# Patient Record
Sex: Female | Born: 1975 | Race: Black or African American | Hispanic: No | Smoking: Current every day smoker
Health system: Southern US, Community
[De-identification: ages and names within clinical notes are randomized; demographics above are authoritative.]

## PROBLEM LIST (undated history)

## (undated) DIAGNOSIS — K219 Gastro-esophageal reflux disease without esophagitis: Secondary | ICD-10-CM

## (undated) DIAGNOSIS — F1910 Other psychoactive substance abuse, uncomplicated: Secondary | ICD-10-CM

## (undated) DIAGNOSIS — M5136 Other intervertebral disc degeneration, lumbar region: Secondary | ICD-10-CM

## (undated) DIAGNOSIS — G518 Other disorders of facial nerve: Secondary | ICD-10-CM

## (undated) DIAGNOSIS — B029 Zoster without complications: Secondary | ICD-10-CM

## (undated) DIAGNOSIS — F329 Major depressive disorder, single episode, unspecified: Secondary | ICD-10-CM

## (undated) DIAGNOSIS — F419 Anxiety disorder, unspecified: Secondary | ICD-10-CM

## (undated) DIAGNOSIS — R45851 Suicidal ideations: Secondary | ICD-10-CM

## (undated) DIAGNOSIS — F32A Depression, unspecified: Secondary | ICD-10-CM

## (undated) DIAGNOSIS — I10 Essential (primary) hypertension: Secondary | ICD-10-CM

## (undated) HISTORY — DX: Essential (primary) hypertension: I10

## (undated) HISTORY — PX: TUBAL LIGATION: SHX77

## (undated) HISTORY — DX: Other intervertebral disc degeneration, lumbar region: M51.36

## (undated) HISTORY — DX: Gastro-esophageal reflux disease without esophagitis: K21.9

## (undated) HISTORY — DX: Other disorders of facial nerve: G51.8

## (undated) HISTORY — DX: Other psychoactive substance abuse, uncomplicated: F19.10

## (undated) HISTORY — PX: OTHER SURGICAL HISTORY: SHX169

---

## 1998-01-06 ENCOUNTER — Other Ambulatory Visit: Admission: RE | Admit: 1998-01-06 | Discharge: 1998-01-06 | Payer: Self-pay | Admitting: Obstetrics & Gynecology

## 1998-03-14 ENCOUNTER — Emergency Department (HOSPITAL_COMMUNITY): Admission: EM | Admit: 1998-03-14 | Discharge: 1998-03-15 | Payer: Self-pay | Admitting: Emergency Medicine

## 1999-03-14 ENCOUNTER — Encounter: Payer: Self-pay | Admitting: Emergency Medicine

## 1999-03-14 ENCOUNTER — Emergency Department (HOSPITAL_COMMUNITY): Admission: EM | Admit: 1999-03-14 | Discharge: 1999-03-14 | Payer: Self-pay | Admitting: Emergency Medicine

## 1999-09-01 ENCOUNTER — Inpatient Hospital Stay (HOSPITAL_COMMUNITY): Admission: AD | Admit: 1999-09-01 | Discharge: 1999-09-01 | Payer: Self-pay | Admitting: *Deleted

## 2000-04-07 ENCOUNTER — Emergency Department (HOSPITAL_COMMUNITY): Admission: EM | Admit: 2000-04-07 | Discharge: 2000-04-07 | Payer: Self-pay | Admitting: Emergency Medicine

## 2001-09-03 ENCOUNTER — Inpatient Hospital Stay (HOSPITAL_COMMUNITY): Admission: AD | Admit: 2001-09-03 | Discharge: 2001-09-03 | Payer: Self-pay | Admitting: *Deleted

## 2001-09-22 ENCOUNTER — Inpatient Hospital Stay (HOSPITAL_COMMUNITY): Admission: AD | Admit: 2001-09-22 | Discharge: 2001-09-22 | Payer: Self-pay | Admitting: Obstetrics

## 2001-09-22 ENCOUNTER — Encounter: Payer: Self-pay | Admitting: Obstetrics

## 2002-01-15 ENCOUNTER — Emergency Department (HOSPITAL_COMMUNITY): Admission: EM | Admit: 2002-01-15 | Discharge: 2002-01-15 | Payer: Self-pay | Admitting: Emergency Medicine

## 2002-05-13 ENCOUNTER — Emergency Department (HOSPITAL_COMMUNITY): Admission: EM | Admit: 2002-05-13 | Discharge: 2002-05-13 | Payer: Self-pay | Admitting: Emergency Medicine

## 2002-05-13 ENCOUNTER — Encounter: Payer: Self-pay | Admitting: Emergency Medicine

## 2002-07-07 ENCOUNTER — Emergency Department (HOSPITAL_COMMUNITY): Admission: EM | Admit: 2002-07-07 | Discharge: 2002-07-07 | Payer: Self-pay | Admitting: Emergency Medicine

## 2002-07-28 ENCOUNTER — Emergency Department (HOSPITAL_COMMUNITY): Admission: EM | Admit: 2002-07-28 | Discharge: 2002-07-28 | Payer: Self-pay | Admitting: Emergency Medicine

## 2002-08-26 ENCOUNTER — Emergency Department (HOSPITAL_COMMUNITY): Admission: EM | Admit: 2002-08-26 | Discharge: 2002-08-26 | Payer: Self-pay | Admitting: Emergency Medicine

## 2002-10-25 ENCOUNTER — Inpatient Hospital Stay (HOSPITAL_COMMUNITY): Admission: AD | Admit: 2002-10-25 | Discharge: 2002-10-25 | Payer: Self-pay | Admitting: *Deleted

## 2003-01-01 ENCOUNTER — Inpatient Hospital Stay (HOSPITAL_COMMUNITY): Admission: AD | Admit: 2003-01-01 | Discharge: 2003-01-01 | Payer: Self-pay | Admitting: *Deleted

## 2003-01-01 ENCOUNTER — Encounter: Payer: Self-pay | Admitting: *Deleted

## 2003-04-13 ENCOUNTER — Inpatient Hospital Stay (HOSPITAL_COMMUNITY): Admission: AD | Admit: 2003-04-13 | Discharge: 2003-04-13 | Payer: Self-pay | Admitting: Obstetrics

## 2003-04-15 ENCOUNTER — Inpatient Hospital Stay (HOSPITAL_COMMUNITY): Admission: AD | Admit: 2003-04-15 | Discharge: 2003-04-15 | Payer: Self-pay | Admitting: Obstetrics

## 2003-06-02 ENCOUNTER — Inpatient Hospital Stay (HOSPITAL_COMMUNITY): Admission: AD | Admit: 2003-06-02 | Discharge: 2003-06-05 | Payer: Self-pay | Admitting: Obstetrics

## 2003-06-05 ENCOUNTER — Inpatient Hospital Stay (HOSPITAL_COMMUNITY): Admission: AD | Admit: 2003-06-05 | Discharge: 2003-06-06 | Payer: Self-pay | Admitting: Obstetrics

## 2003-06-07 ENCOUNTER — Inpatient Hospital Stay (HOSPITAL_COMMUNITY): Admission: AD | Admit: 2003-06-07 | Discharge: 2003-06-07 | Payer: Self-pay | Admitting: Obstetrics

## 2004-01-31 ENCOUNTER — Emergency Department (HOSPITAL_COMMUNITY): Admission: EM | Admit: 2004-01-31 | Discharge: 2004-01-31 | Payer: Self-pay | Admitting: Emergency Medicine

## 2004-07-29 ENCOUNTER — Emergency Department (HOSPITAL_COMMUNITY): Admission: EM | Admit: 2004-07-29 | Discharge: 2004-07-30 | Payer: Self-pay | Admitting: Emergency Medicine

## 2004-09-12 ENCOUNTER — Emergency Department (HOSPITAL_COMMUNITY): Admission: EM | Admit: 2004-09-12 | Discharge: 2004-09-13 | Payer: Self-pay | Admitting: Emergency Medicine

## 2004-09-16 ENCOUNTER — Emergency Department (HOSPITAL_COMMUNITY): Admission: EM | Admit: 2004-09-16 | Discharge: 2004-09-16 | Payer: Self-pay | Admitting: Emergency Medicine

## 2005-01-12 ENCOUNTER — Emergency Department (HOSPITAL_COMMUNITY): Admission: EM | Admit: 2005-01-12 | Discharge: 2005-01-12 | Payer: Self-pay | Admitting: Emergency Medicine

## 2005-03-30 ENCOUNTER — Inpatient Hospital Stay (HOSPITAL_COMMUNITY): Admission: AD | Admit: 2005-03-30 | Discharge: 2005-03-30 | Payer: Self-pay | Admitting: Obstetrics

## 2005-04-20 ENCOUNTER — Inpatient Hospital Stay (HOSPITAL_COMMUNITY): Admission: AD | Admit: 2005-04-20 | Discharge: 2005-04-20 | Payer: Self-pay | Admitting: Obstetrics

## 2005-09-13 ENCOUNTER — Inpatient Hospital Stay (HOSPITAL_COMMUNITY): Admission: AD | Admit: 2005-09-13 | Discharge: 2005-09-13 | Payer: Self-pay

## 2005-09-23 ENCOUNTER — Encounter (INDEPENDENT_AMBULATORY_CARE_PROVIDER_SITE_OTHER): Payer: Self-pay | Admitting: Specialist

## 2005-09-23 ENCOUNTER — Inpatient Hospital Stay (HOSPITAL_COMMUNITY): Admission: AD | Admit: 2005-09-23 | Discharge: 2005-09-26 | Payer: Self-pay | Admitting: Obstetrics

## 2006-08-05 ENCOUNTER — Emergency Department (HOSPITAL_COMMUNITY): Admission: EM | Admit: 2006-08-05 | Discharge: 2006-08-05 | Payer: Self-pay | Admitting: *Deleted

## 2006-12-26 ENCOUNTER — Encounter: Admission: RE | Admit: 2006-12-26 | Discharge: 2006-12-26 | Payer: Self-pay | Admitting: Internal Medicine

## 2007-03-31 ENCOUNTER — Emergency Department (HOSPITAL_COMMUNITY): Admission: EM | Admit: 2007-03-31 | Discharge: 2007-03-31 | Payer: Self-pay | Admitting: Emergency Medicine

## 2007-04-09 ENCOUNTER — Emergency Department (HOSPITAL_COMMUNITY): Admission: EM | Admit: 2007-04-09 | Discharge: 2007-04-09 | Payer: Self-pay | Admitting: Emergency Medicine

## 2007-05-22 ENCOUNTER — Emergency Department (HOSPITAL_COMMUNITY): Admission: EM | Admit: 2007-05-22 | Discharge: 2007-05-22 | Payer: Self-pay | Admitting: Emergency Medicine

## 2007-08-04 ENCOUNTER — Emergency Department (HOSPITAL_COMMUNITY): Admission: EM | Admit: 2007-08-04 | Discharge: 2007-08-04 | Payer: Self-pay | Admitting: Emergency Medicine

## 2009-06-27 ENCOUNTER — Emergency Department (HOSPITAL_COMMUNITY): Admission: EM | Admit: 2009-06-27 | Discharge: 2009-06-27 | Payer: Self-pay | Admitting: Emergency Medicine

## 2009-08-12 ENCOUNTER — Emergency Department (HOSPITAL_COMMUNITY): Admission: EM | Admit: 2009-08-12 | Discharge: 2009-08-12 | Payer: Self-pay | Admitting: Emergency Medicine

## 2010-02-03 ENCOUNTER — Emergency Department (HOSPITAL_COMMUNITY): Admission: EM | Admit: 2010-02-03 | Discharge: 2010-02-03 | Payer: Self-pay | Admitting: Emergency Medicine

## 2010-08-18 ENCOUNTER — Emergency Department (HOSPITAL_COMMUNITY): Admission: EM | Admit: 2010-08-18 | Discharge: 2010-08-18 | Payer: Self-pay | Admitting: Emergency Medicine

## 2010-10-22 ENCOUNTER — Encounter: Payer: Self-pay | Admitting: Obstetrics

## 2010-10-23 ENCOUNTER — Encounter: Payer: Self-pay | Admitting: Internal Medicine

## 2011-01-04 LAB — COMPREHENSIVE METABOLIC PANEL
ALT: 15 U/L (ref 0–35)
Albumin: 3.6 g/dL (ref 3.5–5.2)
Calcium: 9.2 mg/dL (ref 8.4–10.5)
Creatinine, Ser: 0.86 mg/dL (ref 0.4–1.2)
GFR calc Af Amer: >60 mL/min (ref >60)
GFR calc non Af Amer: >60 mL/min (ref >60)
Potassium: 4.1 mEq/L (ref 3.5–5.1)
Sodium: 135 mEq/L (ref 135–145)
Total Bilirubin: 0.5 mg/dL (ref 0.3–1.2)
Total Protein: 7.6 g/dL (ref 6.0–8.3)

## 2011-01-04 LAB — POCT CARDIAC MARKERS
CKMB, poc: <1 ng/mL — ABNORMAL LOW (ref 1.0–8.0)
Myoglobin, poc: 76.2 ng/mL (ref 12–200)
Troponin i, poc: <0.05 ng/mL (ref 0.00–0.09)

## 2011-01-04 LAB — CBC
Hemoglobin: 13 g/dL (ref 12.0–15.0)
RBC: 4.63 MIL/uL (ref 3.87–5.11)
RDW: 13 % (ref 11.5–15.5)

## 2011-01-04 LAB — DIFFERENTIAL
Basophils Relative: 1 % (ref 0–1)
Lymphs Abs: 2.7 10*3/uL (ref 0.7–4.0)
Monocytes Absolute: 0.5 10*3/uL (ref 0.1–1.0)
Monocytes Relative: 6 % (ref 3–12)
Neutrophils Relative %: 55 % (ref 43–77)

## 2011-02-17 NOTE — Op Note (Signed)
Ashley Stevens, Ashley Stevens              ACCOUNT NO.:  [id number]   MEDICAL RECORD NO.:  [id number]          PATIENT TYPE:  INP   LOCATION:  9124                          [facility]ACILITY:  [facility]   PHYSICIAN:  Roseanna Rainbow, M.D.DATE OF BIRTH:  May 06, 1976   DATE OF PROCEDURE:  09/23/2005  DATE OF DISCHARGE:                                 OPERATIVE REPORT   PREOPERATIVE DIAGNOSIS:  Intrauterine pregnancy at 36+ weeks, preterm labor,  history of two previous cesarean deliveries, desires sterilization  procedure.   POSTOPERATIVE DIAGNOSIS:  Intrauterine pregnancy at 36+ weeks, preterm  labor, history of two previous cesarean deliveries, desires sterilization  procedure.   PROCEDURE:  Repeat cesarean delivery and [place] bilateral tubal ligation  via Pfannenstiel skin incision.   SURGEON:  Roseanna Rainbow, M.D.   ANESTHESIA:  Spinal.   ESTIMATED BLOOD LOSS:  600 mL.   COMPLICATIONS:  None.   IV FLUIDS:   URINE OUTPUT:  As per anesthesiology.   PROCEDURE:  The patient was taken to the operating room with IV running. A  spinal anesthetic was administered without difficulty. She was then placed  in the dorsal supine position with a leftward tilt and prepped and draped in  usual sterile fashion. Then Pfannenstiel skin incision was then made with  the scalpel and carried down to the underlying fascia. The fascia was  incised along the length of the incision. The superior aspect of the fascial  incision was then tented up with Kocher clamps and the underlying rectus  muscles dissected off. The inferior aspect of the fascial incision was  manipulated in a similar fashion. The rectus muscles were separated in the  midline. The parietal peritoneum was tented up and entered sharply. The  perineal incision was then extended superiorly and inferiorly with good  visualization of the bladder. The bladder blade was then placed. The lower  uterine segment was incised in adverse fashion with  the scalpel. The uterine  incision was then extended bilaterally with bandage scissors. Please note  that the bladder flap had been created sharply. The infant's head was  delivered atraumatically. The oropharynx was suctioned with bulb suction.  The cord was clamped and cut. The infant was handed off to the waiting  neonatologist. The uterus was exteriorized. The placenta was then removed.  The intrauterine cavity was evacuated of any remaining amniotic fluid, clots  and debris with moist laparotomy sponge. The uterine incision was  reapproximated in layers. The incision appeared to extend superiorly into  the fundal region. The left fallopian tube was grasped at the mid isthmic  portion with a Babcock clamp. A window was made in the mesosalpinx with the  Bovie. A 2 cm segment of tube was ligated and then transected. The right  fallopian tube was manipulated in a similar fashion. The uterus was returned  to the abdomen. The paracolic gutters were irrigated. The parietal  peritoneum was reapproximated in a running fashion with 2-0 Vicryl. The  fascia was reapproximated in a running fashion with 0 PDS. The skin was  closed in a subcuticular fashion with 3-0 Monocryl. At the  close of the  procedure the instrument and pack counts were said to be correct x2. 2 grams  of cephazolin were given at cord clamp.      Roseanna Rainbow, M.D.  Electronically Signed     LAJ/MEDQ  D:  09/23/2005  T:  09/25/2005  Job:  [id number]

## 2011-02-17 NOTE — Op Note (Signed)
   [name], Ashley Stevens                        ACCOUNT NO.:  [id number]   MEDICAL RECORD NO.:  [id number]                   PATIENT TYPE:  INP   LOCATION:  9198                                 [facility]ILITY:  [facility]   PHYSICIAN:  Duke Salvia. Marcelle Overlie, M.D.            DATE OF BIRTH:  [birth year]   DATE OF PROCEDURE:  06/02/2003  DATE OF DISCHARGE:                                 OPERATIVE REPORT   PREOPERATIVE DIAGNOSIS:  Previous cesarean section at term, desires repeat.   SURGEON:  Kathreen Cosier, M.D.   FIRST ASSISTANT:  Charles A. Clearance Coots, M.D.   ANESTHESIA:  Epidural.   DESCRIPTION OF PROCEDURE:  The patient placed on the operating table in  supine position, abdomen prepped and draped, bladder emptied with a Foley  catheter.  Transverse suprapubic incision made through the old scar, carried  down to the rectus fascia.  The fascia cleaned and incised the length of the  incision.  The recti muscles retracted laterally, peritoneum incised  longitudinally.  A transverse incision made in the visceral peritoneum above  the bladder and the bladder mobilized inferiorly.  Transverse lower uterine  incision made.  The patient delivered from the OP position of a female,  Apgar 8 and 8, weighing 6 pounds 12 ounces.  The fluid was clear, and the  team was in attendance.  The placenta was removed manually.  It was anterior  fundal.  Uterine cavity cleaned with dry laps.  The uterine incision closed  in one layer with continuous suture of #1 chromic.  Hemostasis was  satisfactory.  Bladder flap reattached with 2-0 chromic.  The uterus was  well-contracted.  Tubes and ovaries are normal.  Abdomen closed in layers,  peritoneum with continuous suture of 0 chromic, fascia with continuous  suture of 0 Dexon, and the skin closed with subcuticular stitch of 3-0  Monocryl.  Blood loss 400 mL.                                               Richard M. Marcelle Overlie, M.D.    RMH/MEDQ  D:  06/02/2003  T:   06/02/2003  Job:  [id number]

## 2011-02-17 NOTE — Discharge Summary (Signed)
   NAMENELSON, JULSON                        ACCOUNT NO.:  [id number]   MEDICAL RECORD NO.:  [id number]                   PATIENT TYPE:  INP   LOCATION:  9115                                 [facility]   PHYSICIAN:  Kathreen Cosier, M.D.           DATE OF BIRTH:  17-Jul-1976   DATE OF ADMISSION:  06/02/2003  DATE OF DISCHARGE:  06/05/2003                                 DISCHARGE SUMMARY   PRE-OP DIAGNOSIS:  Previous cesarean section at term desiring repeat.   The patient is a 35 year old gravida 2, para 1-0-0-1, who had a previous C-  section and was now at term, brought in for elective repeat C-section.  She  had a female with Apgar's of 8 and 8 from the OP position, weighing 6 pounds  12 ounces.  Anterior placenta was sent to pathology.  Post-op her hemoglobin  was 10.5, white count 12.1.  This patient complained of epigastric pain and  heartburn.  She was given Mylanta which did not resolve the problem.  The  patient was on p.o. antibiotics and stated that when she took her  antibiotic, she was also taking milk and that would make the problem worse.  Her antibiotics were discontinued and she felt better.  On the day of  discharge, her pO2 was 97 on room air.  EKG normal.  She was discharged home  on Tylox one every three to four hours p.r.n.  To see me in six weeks.   DISCHARGE DIAGNOSIS:  Status post repeat low-transverse cesarean section at  term.                                               Kathreen Cosier, M.D.    BAM/MEDQ  D:  06/05/2003  T:  06/06/2003  Job:  [id number]

## 2011-02-17 NOTE — Discharge Summary (Signed)
[name], Ashley Stevens              ACCOUNT NO.:  [id number]   MEDICAL RECORD NO.:  [id number]          PATIENT TYPE:  INP   LOCATION:  9124                          [facility]   PHYSICIAN:  Kathreen Cosier, M.D.DATE OF BIRTH:  May 28, 1976   DATE OF ADMISSION:  09/23/2005  DATE OF DISCHARGE:  09/26/2005                                 DISCHARGE SUMMARY   The patient is a 35 year old gravida 4, para 2-0-1-2.  She had two previous  cesarean sections.  Due date was January 17.  She was admitted in labor and  underwent a repeat low transverse cesarean section and tubal ligation.  Postoperatively she did well.  Her hemoglobin at admission was 12.1, white  count 9.4, platelets 303.  Postoperative hemoglobin 10.6, 11.2, 275.  Sodium  135, potassium 4, chloride 107, glucose 95.  RPR negative.  The patient did  well and was discharged home on the third postoperative day, ambulatory, on  a regular diet, on Tylox for pain, to see me in six weeks.   DISCHARGE DIAGNOSES:  Status post repeat low transverse cesarean section and  tubal ligation at term.           ______________________________  Kathreen Cosier, M.D.     BAM/MEDQ  D:  10/11/2005  T:  10/11/2005  Job:  [id number]

## 2011-05-30 ENCOUNTER — Emergency Department (HOSPITAL_COMMUNITY)
Admission: EM | Admit: 2011-05-30 | Discharge: 2011-05-30 | Disposition: A | Discharge status: Home / Self Care | Payer: Self-pay | Attending: Emergency Medicine | Admitting: Emergency Medicine

## 2011-05-30 ENCOUNTER — Emergency Department (HOSPITAL_COMMUNITY): Payer: Self-pay

## 2011-05-30 DIAGNOSIS — R0602 Shortness of breath: Secondary | ICD-10-CM | POA: Insufficient documentation

## 2011-05-30 DIAGNOSIS — R071 Chest pain on breathing: Secondary | ICD-10-CM | POA: Insufficient documentation

## 2011-05-30 DIAGNOSIS — R079 Chest pain, unspecified: Secondary | ICD-10-CM | POA: Insufficient documentation

## 2012-10-12 ENCOUNTER — Emergency Department (HOSPITAL_COMMUNITY): Payer: Self-pay

## 2012-10-12 ENCOUNTER — Emergency Department (HOSPITAL_COMMUNITY)
Admission: EM | Admit: 2012-10-12 | Discharge: 2012-10-13 | Disposition: A | Discharge status: Home / Self Care | Payer: Self-pay | Attending: Emergency Medicine | Admitting: Emergency Medicine

## 2012-10-12 DIAGNOSIS — IMO0002 Reserved for concepts with insufficient information to code with codable children: Secondary | ICD-10-CM | POA: Insufficient documentation

## 2012-10-12 DIAGNOSIS — R51 Headache: Secondary | ICD-10-CM | POA: Insufficient documentation

## 2012-10-12 DIAGNOSIS — Z23 Encounter for immunization: Secondary | ICD-10-CM | POA: Insufficient documentation

## 2012-10-12 DIAGNOSIS — K089 Disorder of teeth and supporting structures, unspecified: Secondary | ICD-10-CM | POA: Insufficient documentation

## 2012-10-12 DIAGNOSIS — S0993XA Unspecified injury of face, initial encounter: Secondary | ICD-10-CM | POA: Insufficient documentation

## 2012-10-12 DIAGNOSIS — S61409A Unspecified open wound of unspecified hand, initial encounter: Secondary | ICD-10-CM | POA: Insufficient documentation

## 2012-10-12 DIAGNOSIS — S199XXA Unspecified injury of neck, initial encounter: Secondary | ICD-10-CM | POA: Insufficient documentation

## 2012-10-12 DIAGNOSIS — S6980XA Other specified injuries of unspecified wrist, hand and finger(s), initial encounter: Secondary | ICD-10-CM | POA: Insufficient documentation

## 2012-10-12 DIAGNOSIS — Y9389 Activity, other specified: Secondary | ICD-10-CM | POA: Insufficient documentation

## 2012-10-12 DIAGNOSIS — Y92410 Unspecified street and highway as the place of occurrence of the external cause: Secondary | ICD-10-CM | POA: Insufficient documentation

## 2012-10-12 DIAGNOSIS — S6990XA Unspecified injury of unspecified wrist, hand and finger(s), initial encounter: Secondary | ICD-10-CM | POA: Insufficient documentation

## 2012-10-12 MED ORDER — TETANUS-DIPHTH-ACELL PERTUSSIS 5-2.5-18.5 LF-MCG/0.5 IM SUSP
0.5000 mL | Freq: Once | INTRAMUSCULAR | Status: AC
  Administered 2012-10-12: 0.5 mL via INTRAMUSCULAR
  Filled 2012-10-12: qty 0.5

## 2012-10-12 NOTE — ED Notes (Signed)
MD at bedside. Dr. Kohut at bedside.  
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
## 2012-10-12 NOTE — ED Notes (Signed)
[employer] at bedside 
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
## 2012-10-13 MED ORDER — IBUPROFEN 800 MG PO TABS
ORAL_TABLET | ORAL | Status: AC
  Administered 2012-10-13: 800 mg via ORAL
  Filled 2012-10-13: qty 1

## 2012-10-13 MED ORDER — IBUPROFEN 800 MG PO TABS
800.0000 mg | ORAL_TABLET | Freq: Once | ORAL | Status: AC
  Administered 2012-10-13: 800 mg via ORAL

## 2012-10-13 MED ORDER — IBUPROFEN 600 MG PO TABS: 600.0000 mg | ORAL_TABLET | Freq: Four times a day (QID) | ORAL | Status: DC | PRN

## 2012-10-13 NOTE — ED Notes (Signed)
Pt c/o pain in teeth. Pt states it feels like they don't line up right. PA aware. Pt with no acute distress.

## 2012-10-13 NOTE — ED Notes (Signed)
Patient transported to CT 

## 2012-10-13 NOTE — ED Notes (Signed)
Pt moaning. Pt states her teeth hurt. Pt has lac to tongue. Bleeding controlled. Pt states she can call someone for a ride home.
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
## 2014-11-23 ENCOUNTER — Ambulatory Visit (INDEPENDENT_AMBULATORY_CARE_PROVIDER_SITE_OTHER): Payer: Self-pay | Admitting: Surgery

## 2014-11-23 NOTE — H&P (Signed)
History of Present Illness Ashley Stevens. Courtnei Ruddell MD; 11/23/2014 12:15 PM) Patient words: consult lumpectomy.  The patient is a 39 year old female who presents with a breast mass. Referred by Dr. Marella Bile at [facility] for right breast mass  This is a 39 year old female in good health who presents with a palpable mass in her right breast just below the areola for about the last month. She is not sure whether this has been enlarging. It occasionally becomes mildly tender to palpation. She denies any erythema or drainage over this area. She went to see a doctor at the urgent care who referred her for mammogram. This was performed at [facility]. Her mammogram was unremarkable. The ultrasound showed a 1.2 cm circumscribed mass in the right breast at 7:00 just below the areola. They felt that this represented fat necrosis or a lipoma. The patient is now referred for surgical evaluation.   Menarche -53 First pregnancy - 18 Breast feeding - no Hormones - OCP x 4 years LMP - last week FH - paternal aunt/ paternal cousin (44) Other Problems Aleatha Borer, LPN; 9/32/3557 32:20 AM) Chest pain Gastroesophageal Reflux Disease Hemorrhoids  Past Surgical History (Ammie Eversole, LPN; 2/54/2706 23:76 AM) Cesarean Section - Multiple  Diagnostic Studies History (Ammie Eversole, LPN; 2/83/1517 61:60 AM) Colonoscopy never Mammogram within last year Pap Smear 1-5 years ago  Allergies (Ammie Eversole, LPN; 7/37/1062 69:48 AM) Percocet *ANALGESICS - OPIOID* Vicodin *ANALGESICS - OPIOID*  Medication History (Ammie Eversole, LPN; 5/46/2703 50:09 AM) Xanax (0.5MG  Tablet, Oral) Active.  Pregnancy / Birth History Aleatha Borer, LPN; 3/81/8299 37:16 AM) Age at menarche 60 years. Contraceptive History Depo-provera, Oral contraceptives. Gravida 5 Maternal age 109-20 Para 3 Regular periods     Review of Systems (Ammie Eversole LPN; 9/67/8938 10:17 AM) General  Not Present- Appetite Loss, Chills, Fatigue, Fever, Night Sweats, Weight Gain and Weight Loss. Skin Not Present- Change in Wart/Mole, Dryness, Hives, Jaundice, New Lesions, Non-Healing Wounds, Rash and Ulcer. HEENT Not Present- Earache, Hearing Loss, Hoarseness, Nose Bleed, Oral Ulcers, Ringing in the Ears, Seasonal Allergies, Sinus Pain, Sore Throat, Visual Disturbances, Wears glasses/contact lenses and Yellow Eyes. Respiratory Not Present- Bloody sputum, Chronic Cough, Difficulty Breathing, Snoring and Wheezing. Cardiovascular Not Present- Chest Pain, Difficulty Breathing Lying Down, Leg Cramps, Palpitations, Rapid Heart Rate, Shortness of Breath and Swelling of Extremities. Gastrointestinal Not Present- Abdominal Pain, Bloating, Bloody Stool, Change in Bowel Habits, Chronic diarrhea, Constipation, Difficulty Swallowing, Excessive gas, Gets full quickly at meals, Hemorrhoids, Indigestion, Nausea, Rectal Pain and Vomiting. Female Genitourinary Not Present- Frequency, Nocturia, Painful Urination, Pelvic Pain and Urgency. Musculoskeletal Not Present- Back Pain, Joint Pain, Joint Stiffness, Muscle Pain, Muscle Weakness and Swelling of Extremities. Neurological Not Present- Decreased Memory, Fainting, Headaches, Numbness, Seizures, Tingling, Tremor, Trouble walking and Weakness. Psychiatric Present- Anxiety and Depression. Not Present- Bipolar, Change in Sleep Pattern, Fearful and Frequent crying. Endocrine Not Present- Cold Intolerance, Excessive Hunger, Hair Changes, Heat Intolerance, Hot flashes and New Diabetes. Hematology Not Present- Easy Bruising, Excessive bleeding, Gland problems, HIV and Persistent Infections.   Physical Exam Rodman Key K. Jennel Mara MD; 11/23/2014 12:14 PM)  The physical exam findings are as follows: Note:WDWN in NAD HEENT: EOMI, sclera anicteric Neck: No masses, no thyromegaly Lungs: CTA bilaterally; normal respiratory effort Breasts: left breast - fibrocystic changes; skin  irritation at inframammary crease - she says this just came up a couple of weeks ago and she is treating with cortisone ointment. no axillary lymphadenopathy Right breast - fibrocystic changes;  no skin changes; no axillary lymphadenopathy. At 7:00 and 5:00 at the lower edge of the areola, there are two palpable masses (1.5 cm and 1 cm respectively). Smooth, mobile. No overlying skin changes CV: Regular rate and rhythm; no murmurs Abd: +bowel sounds, soft, non-tender, no masses Ext: Well-perfused; no edema Skin: Warm, dry; no sign of jaundice    Assessment & Plan Rodman Key K. Amelio Brosky MD; 11/23/2014 12:16 PM)  BREAST MASS, RIGHT (299.24  [id number])  Current Plans Schedule for Surgery - Right breast lumpectomy. The surgical procedure has been discussed with the patient. Potential risks, benefits, alternative treatments, and expected outcomes have been explained. All of the patient's questions at this time have been answered. The likelihood of reaching the patient's treatment goal is good. The patient understand the proposed surgical procedure and wishes to proceed. Note:I think that we can remove both masses through a single circumareolar incision. Not highly suspicious for cancer, but since it is enlarging and with her family history, she would like to have them removed.    Ashley Stevens. Georgette Dover, MD, [facility]  General/ Trauma Surgery  11/23/2014 12:17 PM

## 2015-02-04 ENCOUNTER — Ambulatory Visit: Payer: Self-pay | Admitting: Family Medicine

## 2015-09-23 ENCOUNTER — Other Ambulatory Visit: Payer: Self-pay | Admitting: Oncology

## 2016-01-02 ENCOUNTER — Encounter (HOSPITAL_COMMUNITY): Payer: Self-pay | Admitting: *Deleted

## 2016-01-02 ENCOUNTER — Emergency Department (HOSPITAL_COMMUNITY)
Admission: EM | Admit: 2016-01-02 | Discharge: 2016-01-02 | Disposition: A | Discharge status: Home / Self Care | Payer: Medicaid Other | Attending: Emergency Medicine | Admitting: Emergency Medicine

## 2016-01-02 DIAGNOSIS — F1210 Cannabis abuse, uncomplicated: Secondary | ICD-10-CM | POA: Insufficient documentation

## 2016-01-02 DIAGNOSIS — S3992XA Unspecified injury of lower back, initial encounter: Secondary | ICD-10-CM | POA: Diagnosis not present

## 2016-01-02 DIAGNOSIS — F1410 Cocaine abuse, uncomplicated: Secondary | ICD-10-CM | POA: Insufficient documentation

## 2016-01-02 DIAGNOSIS — Y9389 Activity, other specified: Secondary | ICD-10-CM | POA: Diagnosis not present

## 2016-01-02 DIAGNOSIS — F419 Anxiety disorder, unspecified: Secondary | ICD-10-CM | POA: Diagnosis not present

## 2016-01-02 DIAGNOSIS — S199XXA Unspecified injury of neck, initial encounter: Secondary | ICD-10-CM | POA: Insufficient documentation

## 2016-01-02 DIAGNOSIS — IMO0002 Reserved for concepts with insufficient information to code with codable children: Secondary | ICD-10-CM

## 2016-01-02 DIAGNOSIS — Z79899 Other long term (current) drug therapy: Secondary | ICD-10-CM | POA: Diagnosis not present

## 2016-01-02 DIAGNOSIS — Y9289 Other specified places as the place of occurrence of the external cause: Secondary | ICD-10-CM | POA: Insufficient documentation

## 2016-01-02 DIAGNOSIS — Y998 Other external cause status: Secondary | ICD-10-CM | POA: Diagnosis not present

## 2016-01-02 LAB — COMPREHENSIVE METABOLIC PANEL
ALBUMIN: 3.7 g/dL (ref 3.5–5.0)
ALT: 8 U/L — ABNORMAL LOW (ref 14–54)
AST: 15 U/L (ref 15–41)
Alkaline Phosphatase: 72 U/L (ref 38–126)
Anion gap: 10 (ref 5–15)
BILIRUBIN TOTAL: 0.8 mg/dL (ref 0.3–1.2)
CHLORIDE: 104 mmol/L (ref 101–111)
CO2: 22 mmol/L (ref 22–32)
Calcium: 9.2 mg/dL (ref 8.9–10.3)
Creatinine, Ser: 1.07 mg/dL — ABNORMAL HIGH (ref 0.44–1.00)
GFR calc Af Amer: >60 mL/min (ref >60)
GFR calc non Af Amer: >60 mL/min (ref >60)
GLUCOSE: 98 mg/dL (ref 65–99)
POTASSIUM: 3.7 mmol/L (ref 3.5–5.1)
SODIUM: 136 mmol/L (ref 135–145)
Total Protein: 6.9 g/dL (ref 6.5–8.1)

## 2016-01-02 LAB — RAPID URINE DRUG SCREEN, HOSP PERFORMED
AMPHETAMINES: NOT DETECTED
BENZODIAZEPINES: NOT DETECTED
Barbiturates: NOT DETECTED
Cocaine: POSITIVE — AB
Opiates: NOT DETECTED
TETRAHYDROCANNABINOL: POSITIVE — AB

## 2016-01-02 LAB — CBC WITH DIFFERENTIAL/PLATELET
BASOS ABS: 0 10*3/uL (ref 0.0–0.1)
BASOS PCT: 0 %
Eosinophils Absolute: 0.1 10*3/uL (ref 0.0–0.7)
Eosinophils Relative: 1 %
HEMATOCRIT: 38.6 % (ref 36.0–46.0)
Hemoglobin: 12.7 g/dL (ref 12.0–15.0)
Lymphocytes Relative: 22 %
Lymphs Abs: 2.6 10*3/uL (ref 0.7–4.0)
MCH: 27.4 pg (ref 26.0–34.0)
MCHC: 32.9 g/dL (ref 30.0–36.0)
MCV: 83.4 fL (ref 78.0–100.0)
MONO ABS: 0.8 10*3/uL (ref 0.1–1.0)
Monocytes Relative: 7 %
NEUTROS ABS: 8.4 10*3/uL — AB (ref 1.7–7.7)
Neutrophils Relative %: 70 %
PLATELETS: 342 10*3/uL (ref 150–400)
RBC: 4.63 MIL/uL (ref 3.87–5.11)
RDW: 14.1 % (ref 11.5–15.5)
WBC: 11.9 10*3/uL — ABNORMAL HIGH (ref 4.0–10.5)

## 2016-01-02 LAB — URINALYSIS, ROUTINE W REFLEX MICROSCOPIC
Bilirubin Urine: NEGATIVE
GLUCOSE, UA: NEGATIVE mg/dL
HGB URINE DIPSTICK: NEGATIVE
Ketones, ur: NEGATIVE mg/dL
Nitrite: NEGATIVE
PH: 6 (ref 5.0–8.0)
Protein, ur: NEGATIVE mg/dL

## 2016-01-02 LAB — URINE MICROSCOPIC-ADD ON: BACTERIA UA: NONE SEEN

## 2016-01-02 LAB — RAPID HIV SCREEN (HIV 1/2 AB+AG)
HIV 1/2 Antibodies: NONREACTIVE
HIV-1 P24 Antigen - HIV24: NONREACTIVE

## 2016-01-02 LAB — PREGNANCY, URINE: Preg Test, Ur: NEGATIVE

## 2016-01-02 LAB — ACETAMINOPHEN LEVEL

## 2016-01-02 LAB — SALICYLATE LEVEL

## 2016-01-02 MED ORDER — DOLUTEGRAVIR SODIUM 50 MG PO TABS
50.0000 mg | ORAL_TABLET | Freq: Every day | ORAL | Status: DC
  Filled 2016-01-02: qty 1

## 2016-01-02 MED ORDER — ELVITEG-COBIC-EMTRICIT-TENOFAF 150-150-200-10 MG PO TABS: 1.0000 | ORAL_TABLET | Freq: Every day | ORAL | Status: DC

## 2016-01-02 MED ORDER — EMTRICITABINE-TENOFOVIR DF 200-300 MG PO TABS
1.0000 | ORAL_TABLET | Freq: Every day | ORAL | Status: AC
  Administered 2016-01-02: 1 via ORAL
  Filled 2016-01-02: qty 1

## 2016-01-02 MED ORDER — DOLUTEGRAVIR SODIUM 50 MG PO TABS: 50.0000 mg | ORAL_TABLET | Freq: Every day | ORAL | Status: DC

## 2016-01-02 MED ORDER — DOLUTEGRAVIR SODIUM 50 MG PO TABS
50.0000 mg | ORAL_TABLET | Freq: Every day | ORAL | Status: AC
  Administered 2016-01-02: 50 mg via ORAL
  Filled 2016-01-02: qty 1

## 2016-01-02 MED ORDER — EMTRICITABINE-TENOFOVIR DF 200-300 MG PO TABS: 1.0000 | ORAL_TABLET | Freq: Every day | ORAL | Status: DC

## 2016-01-02 NOTE — Discharge Instructions (Signed)
Needle Stick Injury Take the HIV medications as prescribed. Follow up with the health department or infectious disease clinic. Return to the ED if you develop new or worsening symptoms. A needle stick injury occurs when you are stuck by a needle that may have the blood from another person on it. Most of the time these injuries heal without any problem, but several diseases can be transmitted this way. You should be aware of the risks. A needle stick injury can cause risk for getting:  Hepatitis B.  Hepatitis C.  HIV infection (the virus that causes AIDS). The chance of getting one of these infections from a needle stick injury is small. However, it is important to take proper precautions to prevent such an injury. It is also important to understand and follow some health care recommendations when such an injury occurs.  RISK FACTORS In general, the risk of infection after a needle stick injury appears to be higher with:  Exposure to a needle that is visibly contaminated with blood.  Exposure to the blood of a patient with an advanced disease or with a high viral load.  A deep injury.  Needle placement in a vein or artery. PREVENTION  All health care workers should:  Wash their hands often, including before and after caring for each patient.  Receive the hepatitis B vaccine before any possible exposure to blood or bloody body fluids.  Use personal protective equipment (PPE) when appropriate. This includes:  Gloves.  Gowns.  Boots.  Shoe covers.  Eyewear.  Masks.  Wear gloves when any kind of venous or arterial access is being done.  Use safety devices when available.  Use sharp edges and needles with caution.  Dispose of used needles and other sharps in puncture proof receptacles.  Never recap needles. TREATMENT   After a needle stick injury, immediate cleansing with soap and water or an alcohol-based hand hygiene agent is needed.  If you did not have a tetanus  booster within the past 10 years, a booster shot should be given.  If the puncture site becomes red, swollen, more painful, or drains yellowish-white fluid (pus), medicine for a bacterial infection may be needed.  If the blood on the needle is known or thought to be high risk for hepatitis B or HIV, additional treatment is needed.  For needle stick exposures to the HIV virus, drug treatment is advised. This treatment is called post-exposure prophylaxis (PEP) and should be started as soon as possible following the injury. The recommended period of treatment with medicines is usually 4 weeks with 2 or more different drugs. You should have follow-up counseling and a medical evaluation, including HIV blood tests, right away. The tests should be repeated at 6 weeks, 3 months, and 6 months. Blood tests to monitor for drug toxicity effects of the PEP medicines are usually recommended immediately before treatment starts and again at 2 weeks and 4 weeks after the start of PEP. Additional recommendations during the first 6 to 12 weeks after exposure include:  Practicing sexual abstinence or using condoms to prevent sexual transmission and to avoid pregnancy.  Refraining from donating blood, plasma, semen, organs, or other tissue.  For breastfeeding women, considering temporary discontinuation of breastfeeding while on PEP.  For needle stick exposures to hepatitis B, blood testing and PEP is also needed. If you have not been vaccinated against hepatitis B, this vaccine series should be started and hepatitis B immune globulin should also be given. If you have been previously  vaccinated, your status of immunity to infection with hepatitis B can be tested by a blood antibody test. Before or after those test results are available, repeat vaccination with or without hepatitis B immune globulin will be considered.  Unfortunately, no helpful treatment following hepatitis C exposure has been identified or is  recommended. Follow-up blood testing is advised over a period of 4 weeks to 6 months to determine if the needle stick led to an infection. Ask your caregiver for advice about this follow-up testing. HOME CARE INSTRUCTIONS  Take medicine exactly as told by your caregiver.  Keep all follow-up appointments.  Do not share personal hygiene items. SEEK IMMEDIATE MEDICAL CARE IF:  You have concerns about your injury, treatment, or follow-up.  The injury site becomes red, swollen, or painful.  The injury site drains pus. MAKE SURE YOU:  Understand these instructions.  Will watch your condition.  Will get help right away if you are not doing well or get worse.   This information is not intended to replace advice given to you by your health care provider. Make sure you discuss any questions you have with your health care provider.   Document Released: 09/18/2005 Document Revised: 10/09/2014 Document Reviewed: 04/07/2015 Elsevier Interactive Patient Education [employer].
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
## 2016-01-02 NOTE — ED Notes (Signed)
Pt verbalizes understanding of instructions. 
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
## 2016-01-02 NOTE — ED Notes (Signed)
PT unable to provide UA at this time... 
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
## 2016-01-02 NOTE — ED Notes (Signed)
TTS in progress 

## 2016-01-03 LAB — HEPATITIS PANEL, ACUTE
HCV Ab: <0.1 s/co ratio (ref 0.0–0.9)
HEP B C IGM: NEGATIVE
HEP B S AG: NEGATIVE
Hep A IgM: NEGATIVE

## 2016-01-06 ENCOUNTER — Emergency Department (HOSPITAL_COMMUNITY)
Admission: EM | Admit: 2016-01-06 | Discharge: 2016-01-06 | Disposition: A | Discharge status: Home / Self Care | Payer: Medicaid Other | Attending: Emergency Medicine | Admitting: Emergency Medicine

## 2016-01-06 ENCOUNTER — Encounter (HOSPITAL_COMMUNITY): Payer: Self-pay | Admitting: Emergency Medicine

## 2016-01-06 DIAGNOSIS — Y9389 Activity, other specified: Secondary | ICD-10-CM | POA: Insufficient documentation

## 2016-01-06 DIAGNOSIS — Y998 Other external cause status: Secondary | ICD-10-CM | POA: Diagnosis not present

## 2016-01-06 DIAGNOSIS — Z23 Encounter for immunization: Secondary | ICD-10-CM | POA: Insufficient documentation

## 2016-01-06 DIAGNOSIS — T148XXA Other injury of unspecified body region, initial encounter: Secondary | ICD-10-CM

## 2016-01-06 DIAGNOSIS — F17200 Nicotine dependence, unspecified, uncomplicated: Secondary | ICD-10-CM | POA: Insufficient documentation

## 2016-01-06 DIAGNOSIS — Z4800 Encounter for change or removal of nonsurgical wound dressing: Secondary | ICD-10-CM | POA: Diagnosis present

## 2016-01-06 DIAGNOSIS — S71112A Laceration without foreign body, left thigh, initial encounter: Secondary | ICD-10-CM | POA: Insufficient documentation

## 2016-01-06 DIAGNOSIS — Y9289 Other specified places as the place of occurrence of the external cause: Secondary | ICD-10-CM | POA: Diagnosis not present

## 2016-01-06 DIAGNOSIS — Z79899 Other long term (current) drug therapy: Secondary | ICD-10-CM | POA: Insufficient documentation

## 2016-01-06 DIAGNOSIS — S70312A Abrasion, left thigh, initial encounter: Secondary | ICD-10-CM | POA: Insufficient documentation

## 2016-01-06 DIAGNOSIS — Y288XXA Contact with other sharp object, undetermined intent, initial encounter: Secondary | ICD-10-CM | POA: Insufficient documentation

## 2016-01-06 MED ORDER — BACITRACIN ZINC 500 UNIT/GM EX OINT
TOPICAL_OINTMENT | Freq: Two times a day (BID) | CUTANEOUS | Status: DC
  Administered 2016-01-06: 1 via TOPICAL
  Filled 2016-01-06: qty 0.9

## 2016-01-06 MED ORDER — TETANUS-DIPHTH-ACELL PERTUSSIS 5-2.5-18.5 LF-MCG/0.5 IM SUSP
0.5000 mL | Freq: Once | INTRAMUSCULAR | Status: AC
  Administered 2016-01-06: 0.5 mL via INTRAMUSCULAR
  Filled 2016-01-06: qty 0.5
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
## 2016-03-04 ENCOUNTER — Emergency Department (HOSPITAL_COMMUNITY)
Admission: EM | Admit: 2016-03-04 | Discharge: 2016-03-04 | Disposition: A | Discharge status: Home / Self Care | Payer: Medicaid Other | Attending: Emergency Medicine | Admitting: Emergency Medicine

## 2016-03-04 ENCOUNTER — Emergency Department (HOSPITAL_COMMUNITY): Payer: Medicaid Other

## 2016-03-04 ENCOUNTER — Encounter (HOSPITAL_COMMUNITY): Payer: Self-pay | Admitting: Emergency Medicine

## 2016-03-04 DIAGNOSIS — F17200 Nicotine dependence, unspecified, uncomplicated: Secondary | ICD-10-CM | POA: Diagnosis not present

## 2016-03-04 DIAGNOSIS — R0602 Shortness of breath: Secondary | ICD-10-CM | POA: Insufficient documentation

## 2016-03-04 DIAGNOSIS — R0789 Other chest pain: Secondary | ICD-10-CM | POA: Insufficient documentation

## 2016-03-04 DIAGNOSIS — R079 Chest pain, unspecified: Secondary | ICD-10-CM | POA: Diagnosis present

## 2016-03-04 DIAGNOSIS — Z79899 Other long term (current) drug therapy: Secondary | ICD-10-CM | POA: Diagnosis not present

## 2016-03-04 LAB — CBC WITH DIFFERENTIAL/PLATELET
Basophils Absolute: 0 10*3/uL (ref 0.0–0.1)
Basophils Relative: 0 %
Eosinophils Absolute: 0.1 10*3/uL (ref 0.0–0.7)
Eosinophils Relative: 1 %
HCT: 38.5 % (ref 36.0–46.0)
HEMOGLOBIN: 12.3 g/dL (ref 12.0–15.0)
LYMPHS ABS: 3 10*3/uL (ref 0.7–4.0)
LYMPHS PCT: 26 %
MCH: 26.3 pg (ref 26.0–34.0)
MCHC: 31.9 g/dL (ref 30.0–36.0)
MCV: 82.3 fL (ref 78.0–100.0)
Monocytes Absolute: 0.6 10*3/uL (ref 0.1–1.0)
Monocytes Relative: 6 %
NEUTROS PCT: 67 %
Neutro Abs: 7.8 10*3/uL — ABNORMAL HIGH (ref 1.7–7.7)
Platelets: 383 10*3/uL (ref 150–400)
RBC: 4.68 MIL/uL (ref 3.87–5.11)
RDW: 13.8 % (ref 11.5–15.5)
WBC: 11.5 10*3/uL — AB (ref 4.0–10.5)

## 2016-03-04 LAB — COMPREHENSIVE METABOLIC PANEL
ALK PHOS: 86 U/L (ref 38–126)
ALT: 8 U/L — AB (ref 14–54)
AST: 26 U/L (ref 15–41)
Albumin: 3.7 g/dL (ref 3.5–5.0)
Anion gap: 7 (ref 5–15)
BILIRUBIN TOTAL: 0.7 mg/dL (ref 0.3–1.2)
BUN: 8 mg/dL (ref 6–20)
CALCIUM: 9.1 mg/dL (ref 8.9–10.3)
CO2: 23 mmol/L (ref 22–32)
CREATININE: 1.04 mg/dL — AB (ref 0.44–1.00)
Chloride: 103 mmol/L (ref 101–111)
Glucose, Bld: 99 mg/dL (ref 65–99)
Potassium: 3.5 mmol/L (ref 3.5–5.1)
Sodium: 133 mmol/L — ABNORMAL LOW (ref 135–145)
Total Protein: 7.5 g/dL (ref 6.5–8.1)

## 2016-03-04 LAB — I-STAT TROPONIN, ED: TROPONIN I, POC: 0 ng/mL (ref 0.00–0.08)

## 2016-03-04 NOTE — ED Notes (Signed)
Pt c/o right sided chest pain off and on all day.  No radiation of pain.  Denies nausea or vomiting  Describes pain as a pressure type pain

## 2016-03-04 NOTE — ED Provider Notes (Signed)
CSN: [id number]     Arrival date & time 03/04/16  2012 History   First MD Initiated Contact with Patient 03/04/16 2109     Chief Complaint  Patient presents with  . Chest Pain     (Consider location/radiation/quality/duration/timing/severity/associated sxs/prior Treatment) Patient is a 40 y.o. female presenting with chest pain. The history is provided by the patient.  Chest Pain Pain location:  R chest Pain quality: aching and pressure   Pain radiates to:  Does not radiate Pain radiates to the back: no   Pain severity:  Moderate Onset quality:  Gradual Duration:  20 minutes Timing:  Intermittent Progression:  Waxing and waning Chronicity:  Recurrent Context: at rest   Relieved by:  Nothing Worsened by:  Nothing tried Ineffective treatments:  None tried Associated symptoms: shortness of breath   Associated symptoms: no cough, no fever, no lower extremity edema and not vomiting     History reviewed. No pertinent past medical history. Past Surgical History  Procedure Laterality Date  . C-section     No family history on file. Social History  Substance Use Topics  . Smoking status: Current Every Day Smoker  . Smokeless tobacco: None  . Alcohol Use: No   OB History    No data available     Review of Systems  Constitutional: Negative for fever.  Respiratory: Positive for shortness of breath. Negative for cough.   Cardiovascular: Positive for chest pain.  Gastrointestinal: Negative for vomiting.  All other systems reviewed and are negative.     Allergies  Review of patient's allergies indicates no known allergies.  Home Medications   Prior to Admission medications   Medication Sig Start Date End Date Taking? Authorizing Provider  dolutegravir (TIVICAY) 50 MG tablet Take 1 tablet (50 mg total) by mouth daily. 01/02/16   Ezequiel Essex, MD  dolutegravir (TIVICAY) 50 MG tablet Take 1 tablet (50 mg total) by mouth daily. 01/02/16   Ezequiel Essex, MD   emtricitabine-tenofovir (TRUVADA) 200-300 MG tablet Take 1 tablet by mouth daily. 01/02/16   Ezequiel Essex, MD  emtricitabine-tenofovir (TRUVADA) 200-300 MG tablet Take 1 tablet by mouth daily. 01/02/16   Ezequiel Essex, MD   BP 113/82 mmHg  Pulse 75  Temp(Src) 99.3 F (37.4 C) (Oral)  Resp 16  Ht 6' (1.829 m)  Wt 260 lb (117.935 kg)  BMI 35.25 kg/m2  SpO2 100%  LMP 03/04/2016 Physical Exam  Constitutional: She is oriented to person, place, and time. She appears well-developed and well-nourished. No distress.  HENT:  Head: Normocephalic.  Eyes: Conjunctivae are normal.  Neck: Neck supple. No tracheal deviation present.  Cardiovascular: Normal rate, regular rhythm and normal heart sounds.   Pulmonary/Chest: Effort normal and breath sounds normal. No respiratory distress. She has no wheezes. She has no rales. She exhibits no tenderness.  Abdominal: Soft. She exhibits no distension.  Neurological: She is alert and oriented to person, place, and time.  Skin: Skin is warm and dry.  Psychiatric: She has a normal mood and affect.  Vitals reviewed.   ED Course  Procedures (including critical care time) Labs Review Labs Reviewed  CBC WITH DIFFERENTIAL/PLATELET - Abnormal; Notable for the following:    WBC 11.5 (*)    Neutro Abs 7.8 (*)    All other components within normal limits  COMPREHENSIVE METABOLIC PANEL - Abnormal; Notable for the following:    Sodium 133 (*)    Creatinine, Ser 1.04 (*)    ALT 8 (*)  All other components within normal limits  I-STAT TROPOININ, ED    Imaging Review Dg Chest 2 View  03/04/2016  CLINICAL DATA:  Acute onset of intermittent right-sided chest pain and tightening. Initial encounter. EXAM: CHEST  2 VIEW COMPARISON:  Chest radiograph performed 05/30/2011 FINDINGS: The lungs are well-aerated and clear. There is no evidence of focal opacification, pleural effusion or pneumothorax. The heart is normal in size; the mediastinal contour is within  normal limits. No acute osseous abnormalities are seen. IMPRESSION: No acute cardiopulmonary process seen. Electronically Signed   By: Garald Balding M.D.   On: 03/04/2016 21:01   I have personally reviewed and evaluated these images and lab results as part of my medical decision-making.   EKG Interpretation   Date/Time:  Saturday March 04 2016 20:16:43 EDT Ventricular Rate:  100 PR Interval:  152 QRS Duration: 72 QT Interval:  358 QTC Calculation: 461 R Axis:   72 Text Interpretation:  Normal sinus rhythm No significant change since last  tracing Confirmed by Deaisha Welborn MD, Dejane Scheibe [id number]) on 03/04/2016 9:11:08 PM      MDM   Final diagnoses:  Atypical chest pain    40 y.o. female presents with atypical right sided chest pressure for a month. Occasionally feels like she can't get a full breath but no symptoms currently. The patient is PERC negative. Heart score is 2 with negative troponin. Plan to follow up with PCP as needed and return precautions discussed for worsening or new concerning symptoms.     Leo Grosser, MD 03/05/16 [phone]

## 2016-03-04 NOTE — Discharge Instructions (Signed)
Nonspecific Chest Pain  °Chest pain can be caused by many different conditions. There is always a chance that your pain could be related to something serious, such as a heart attack or a blood clot in your lungs. Chest pain can also be caused by conditions that are not life-threatening. If you have chest pain, it is very important to follow up with your health care provider. °CAUSES  °Chest pain can be caused by: °· Heartburn. °· Pneumonia or bronchitis. °· Anxiety or stress. °· Inflammation around your heart (pericarditis) or lung (pleuritis or pleurisy). °· A blood clot in your lung. °· A collapsed lung (pneumothorax). It can develop suddenly on its own (spontaneous pneumothorax) or from trauma to the chest. °· Shingles infection (varicella-zoster virus). °· Heart attack. °· Damage to the bones, muscles, and cartilage that make up your chest wall. This can include: °¨ Bruised bones due to injury. °¨ Strained muscles or cartilage due to frequent or repeated coughing or overwork. °¨ Fracture to one or more ribs. °¨ Sore cartilage due to inflammation (costochondritis). °RISK FACTORS  °Risk factors for chest pain may include: °· Activities that increase your risk for trauma or injury to your chest. °· Respiratory infections or conditions that cause frequent coughing. °· Medical conditions or overeating that can cause heartburn. °· Heart disease or family history of heart disease. °· Conditions or health behaviors that increase your risk of developing a blood clot. °· Having had chicken pox (varicella zoster). °SIGNS AND SYMPTOMS °Chest pain can feel like: °· Burning or tingling on the surface of your chest or deep in your chest. °· Crushing, pressure, aching, or squeezing pain. °· Dull or sharp pain that is worse when you move, cough, or take a deep breath. °· Pain that is also felt in your back, neck, shoulder, or arm, or pain that spreads to any of these areas. °Your chest pain may come and go, or it may stay  constant. °DIAGNOSIS °Lab tests or other studies may be needed to find the cause of your pain. Your health care provider may have you take a test called an ambulatory ECG (electrocardiogram). An ECG records your heartbeat patterns at the time the test is performed. You may also have other tests, such as: °· Transthoracic echocardiogram (TTE). During echocardiography, sound waves are used to create a picture of all of the heart structures and to look at how blood flows through your heart. °· Transesophageal echocardiogram (TEE). This is a more advanced imaging test that obtains images from inside your body. It allows your health care provider to see your heart in finer detail. °· Cardiac monitoring. This allows your health care provider to monitor your heart rate and rhythm in real time. °· Holter monitor. This is a portable device that records your heartbeat and can help to diagnose abnormal heartbeats. It allows your health care provider to track your heart activity for several days, if needed. °· Stress tests. These can be done through exercise or by taking medicine that makes your heart beat more quickly. °· Blood tests. °· Imaging tests. °TREATMENT  °Your treatment depends on what is causing your chest pain. Treatment may include: °· Medicines. These may include: °¨ Acid blockers for heartburn. °¨ Anti-inflammatory medicine. °¨ Pain medicine for inflammatory conditions. °¨ Antibiotic medicine, if an infection is present. °¨ Medicines to dissolve blood clots. °¨ Medicines to treat coronary artery disease. °· Supportive care for conditions that do not require medicines. This may include: °¨ Resting. °¨ Applying heat   or cold packs to injured areas. °¨ Limiting activities until pain decreases. °HOME CARE INSTRUCTIONS °· If you were prescribed an antibiotic medicine, finish it all even if you start to feel better. °· Avoid any activities that bring on chest pain. °· Do not use any tobacco products, including  cigarettes, chewing tobacco, or electronic cigarettes. If you need help quitting, ask your health care provider. °· Do not drink alcohol. °· Take medicines only as directed by your health care provider. °· Keep all follow-up visits as directed by your health care provider. This is important. This includes any further testing if your chest pain does not go away. °· If heartburn is the cause for your chest pain, you may be told to keep your head raised (elevated) while sleeping. This reduces the chance that acid will go from your stomach into your esophagus. °· Make lifestyle changes as directed by your health care provider. These may include: °¨ Getting regular exercise. Ask your health care provider to suggest some activities that are safe for you. °¨ Eating a heart-healthy diet. A registered dietitian can help you to learn healthy eating options. °¨ Maintaining a healthy weight. °¨ Managing diabetes, if necessary. °¨ Reducing stress. °SEEK MEDICAL CARE IF: °· Your chest pain does not go away after treatment. °· You have a rash with blisters on your chest. °· You have a fever. °SEEK IMMEDIATE MEDICAL CARE IF:  °· Your chest pain is worse. °· You have an increasing cough, or you cough up blood. °· You have severe abdominal pain. °· You have severe weakness. °· You faint. °· You have chills. °· You have sudden, unexplained chest discomfort. °· You have sudden, unexplained discomfort in your arms, back, neck, or jaw. °· You have shortness of breath at any time. °· You suddenly start to sweat, or your skin gets clammy. °· You feel nauseous or you vomit. °· You suddenly feel light-headed or dizzy. °· Your heart begins to beat quickly, or it feels like it is skipping beats. °These symptoms may represent a serious problem that is an emergency. Do not wait to see if the symptoms will go away. Get medical help right away. Call your local emergency services (911 in the [place] Do not drive yourself to the hospital. °  °This  information is not intended to replace advice given to you by your health care provider. Make sure you discuss any questions you have with your health care provider. °  °Document Released: 06/28/2005 Document Revised: 10/09/2014 Document Reviewed: 04/24/2014 °Elsevier Interactive Patient Education ©2016 [employer] ° °

## 2016-03-05 NOTE — ED Notes (Signed)
Went in to DC pt, pt not in room, was not able to

## 2016-05-06 DIAGNOSIS — F17210 Nicotine dependence, cigarettes, uncomplicated: Secondary | ICD-10-CM | POA: Insufficient documentation

## 2016-05-06 DIAGNOSIS — Z5181 Encounter for therapeutic drug level monitoring: Secondary | ICD-10-CM | POA: Insufficient documentation

## 2016-05-06 DIAGNOSIS — F329 Major depressive disorder, single episode, unspecified: Secondary | ICD-10-CM | POA: Insufficient documentation

## 2016-05-07 ENCOUNTER — Inpatient Hospital Stay (HOSPITAL_COMMUNITY)
Admission: AD | Admit: 2016-05-07 | Discharge: 2016-05-11 | DRG: 885 | Disposition: A | Discharge status: Home / Self Care | Payer: Medicaid Other | Source: Intra-hospital | Attending: Psychiatry | Admitting: Psychiatry

## 2016-05-07 ENCOUNTER — Emergency Department (HOSPITAL_COMMUNITY)
Admission: EM | Admit: 2016-05-07 | Discharge: 2016-05-07 | Disposition: A | Discharge status: Other Acute Inpatient Hospital | Payer: Medicaid Other | Attending: Emergency Medicine | Admitting: Emergency Medicine

## 2016-05-07 ENCOUNTER — Encounter (HOSPITAL_COMMUNITY): Payer: Self-pay | Admitting: *Deleted

## 2016-05-07 ENCOUNTER — Encounter (HOSPITAL_COMMUNITY): Payer: Self-pay | Admitting: Emergency Medicine

## 2016-05-07 DIAGNOSIS — F332 Major depressive disorder, recurrent severe without psychotic features: Secondary | ICD-10-CM

## 2016-05-07 DIAGNOSIS — F1420 Cocaine dependence, uncomplicated: Secondary | ICD-10-CM | POA: Diagnosis not present

## 2016-05-07 DIAGNOSIS — F909 Attention-deficit hyperactivity disorder, unspecified type: Secondary | ICD-10-CM | POA: Diagnosis not present

## 2016-05-07 DIAGNOSIS — F17210 Nicotine dependence, cigarettes, uncomplicated: Secondary | ICD-10-CM | POA: Diagnosis not present

## 2016-05-07 DIAGNOSIS — Z79899 Other long term (current) drug therapy: Secondary | ICD-10-CM

## 2016-05-07 DIAGNOSIS — G4700 Insomnia, unspecified: Secondary | ICD-10-CM | POA: Diagnosis not present

## 2016-05-07 DIAGNOSIS — K5900 Constipation, unspecified: Secondary | ICD-10-CM | POA: Diagnosis not present

## 2016-05-07 DIAGNOSIS — F1220 Cannabis dependence, uncomplicated: Secondary | ICD-10-CM

## 2016-05-07 DIAGNOSIS — F419 Anxiety disorder, unspecified: Secondary | ICD-10-CM | POA: Diagnosis not present

## 2016-05-07 DIAGNOSIS — Z8249 Family history of ischemic heart disease and other diseases of the circulatory system: Secondary | ICD-10-CM

## 2016-05-07 DIAGNOSIS — F339 Major depressive disorder, recurrent, unspecified: Secondary | ICD-10-CM | POA: Diagnosis not present

## 2016-05-07 DIAGNOSIS — F32A Depression, unspecified: Secondary | ICD-10-CM

## 2016-05-07 DIAGNOSIS — R45851 Suicidal ideations: Secondary | ICD-10-CM | POA: Diagnosis present

## 2016-05-07 DIAGNOSIS — F329 Major depressive disorder, single episode, unspecified: Secondary | ICD-10-CM | POA: Diagnosis present

## 2016-05-07 LAB — COMPREHENSIVE METABOLIC PANEL
ALT: 8 U/L — ABNORMAL LOW (ref 14–54)
ANION GAP: 5 (ref 5–15)
AST: 16 U/L (ref 15–41)
Albumin: 3.9 g/dL (ref 3.5–5.0)
Alkaline Phosphatase: 81 U/L (ref 38–126)
BILIRUBIN TOTAL: 0.5 mg/dL (ref 0.3–1.2)
BUN: 10 mg/dL (ref 6–20)
CHLORIDE: 108 mmol/L (ref 101–111)
CO2: 24 mmol/L (ref 22–32)
Calcium: 8.8 mg/dL — ABNORMAL LOW (ref 8.9–10.3)
Creatinine, Ser: 0.91 mg/dL (ref 0.44–1.00)
GFR calc Af Amer: >60 mL/min (ref >60)
Glucose, Bld: 98 mg/dL (ref 65–99)
POTASSIUM: 3.4 mmol/L — AB (ref 3.5–5.1)
Sodium: 137 mmol/L (ref 135–145)
Total Protein: 7 g/dL (ref 6.5–8.1)

## 2016-05-07 LAB — RAPID URINE DRUG SCREEN, HOSP PERFORMED
AMPHETAMINES: NOT DETECTED
BARBITURATES: NOT DETECTED
BENZODIAZEPINES: NOT DETECTED
Cocaine: POSITIVE — AB
Opiates: NOT DETECTED
TETRAHYDROCANNABINOL: POSITIVE — AB

## 2016-05-07 LAB — ETHANOL

## 2016-05-07 LAB — CBC
HCT: 37 % (ref 36.0–46.0)
Hemoglobin: 12.1 g/dL (ref 12.0–15.0)
MCH: 27.1 pg (ref 26.0–34.0)
MCHC: 32.7 g/dL (ref 30.0–36.0)
MCV: 82.8 fL (ref 78.0–100.0)
PLATELETS: 301 10*3/uL (ref 150–400)
RBC: 4.47 MIL/uL (ref 3.87–5.11)
RDW: 14.4 % (ref 11.5–15.5)
WBC: 12.4 10*3/uL — AB (ref 4.0–10.5)

## 2016-05-07 LAB — I-STAT BETA HCG BLOOD, ED (MC, WL, AP ONLY)

## 2016-05-07 LAB — ACETAMINOPHEN LEVEL

## 2016-05-07 LAB — SALICYLATE LEVEL

## 2016-05-07 MED ORDER — GABAPENTIN 100 MG PO CAPS
200.0000 mg | ORAL_CAPSULE | Freq: Two times a day (BID) | ORAL | Status: DC
  Administered 2016-05-07: 200 mg via ORAL
  Filled 2016-05-07: qty 2

## 2016-05-07 MED ORDER — FLUOXETINE HCL 20 MG PO CAPS
20.0000 mg | ORAL_CAPSULE | Freq: Every day | ORAL | Status: DC
  Administered 2016-05-07: 20 mg via ORAL
  Filled 2016-05-07: qty 1

## 2016-05-07 MED ORDER — GABAPENTIN 100 MG PO CAPS
200.0000 mg | ORAL_CAPSULE | Freq: Two times a day (BID) | ORAL | Status: DC
  Administered 2016-05-07 – 2016-05-10 (×6): 200 mg via ORAL
  Filled 2016-05-07 (×11): qty 2

## 2016-05-07 MED ORDER — ACETAMINOPHEN 325 MG PO TABS: 650.0000 mg | ORAL_TABLET | Freq: Four times a day (QID) | ORAL | Status: DC | PRN

## 2016-05-07 MED ORDER — FLUOXETINE HCL 20 MG PO CAPS
20.0000 mg | ORAL_CAPSULE | Freq: Every day | ORAL | Status: DC
  Filled 2016-05-07 (×2): qty 1

## 2016-05-07 MED ORDER — TRAZODONE HCL 50 MG PO TABS
50.0000 mg | ORAL_TABLET | Freq: Every day | ORAL | Status: DC
  Filled 2016-05-07 (×3): qty 1

## 2016-05-07 MED ORDER — NICOTINE 14 MG/24HR TD PT24
14.0000 mg | MEDICATED_PATCH | Freq: Every day | TRANSDERMAL | Status: DC | PRN
  Administered 2016-05-08 – 2016-05-09 (×2): 14 mg via TRANSDERMAL
  Filled 2016-05-07 (×3): qty 1

## 2016-05-07 MED ORDER — ALUM & MAG HYDROXIDE-SIMETH 200-200-20 MG/5ML PO SUSP: 30.0000 mL | ORAL | Status: DC | PRN

## 2016-05-07 MED ORDER — HYDROXYZINE HCL 25 MG PO TABS
25.0000 mg | ORAL_TABLET | Freq: Four times a day (QID) | ORAL | Status: DC | PRN
  Administered 2016-05-07 – 2016-05-11 (×7): 25 mg via ORAL
  Filled 2016-05-07 (×7): qty 1

## 2016-05-07 MED ORDER — MAGNESIUM HYDROXIDE 400 MG/5ML PO SUSP
30.0000 mL | Freq: Every day | ORAL | Status: DC | PRN
  Administered 2016-05-08 – 2016-05-10 (×3): 30 mL via ORAL
  Filled 2016-05-07 (×3): qty 30

## 2016-05-07 NOTE — BH Assessment (Addendum)
Assessment completed. Consulted with Serena Colonel, FNP who recommends treatment in the observation unit once patient is medically cleared.  Rosalin Hawking, LCSW Therapeutic Triage Specialist [place] 05/07/2016 5:13 AM
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
## 2016-05-07 NOTE — ED Notes (Signed)
[facility] transport on unit to transfer pt to [facility] Adult unit per MD order. Pt signed for personal belongings and property given to transport for transfer. Pt signed e-signature. Ambulatory off unit with transport.
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
## 2016-05-07 NOTE — Progress Notes (Signed)
Adult Psychoeducational Group Note  Date:  05/07/2016 Time:  9:22 PM  Group Topic/Focus:  Wrap-Up Group:   The focus of this group is to help patients review their daily goal of treatment and discuss progress on daily workbooks.   Participation Level:  Active  Participation Quality:  Appropriate  Affect:  Appropriate  Cognitive:  Alert  Insight: Appropriate  Engagement in Group:  Engaged  Modes of Intervention:  Discussion  Additional Comments:  Patient states, "I had an alright day". Patient also states, today has been restful for her.   Rahkim Rabalais L Laurianne Floresca 05/07/2016, 9:22 PM
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
## 2016-05-07 NOTE — Clinical Social Work Note (Signed)
CSW met with patient to provide [facility] inpatient information and have her sign consent to treat form.  Patient is going into 402 bed 1. Signed consent form provided to RN who is aware patient has a bed today.   Dede Query, LCSW [place] Worker - Weekend Coverage cell #: [phone]

## 2016-05-07 NOTE — ED Notes (Signed)
Pt forwards little with this nurse, reports she is tired. Special checks q 15 mins in place for safety. Video monitoring in place. Will continue to monitor.
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
## 2016-05-07 NOTE — Tx Team (Signed)
Initial Interdisciplinary Treatment Plan   PATIENT STRESSORS: Substance abuse Family Issues   PATIENT STRENGTHS: Ability for insight Average or above average intelligence   PROBLEM LIST: Problem List/Patient Goals Date to be addressed Date deferred Reason deferred Estimated date of resolution  "Emotional Pain" 05/07/16           Depression 05/07/16                                          DISCHARGE CRITERIA:  Improved stabilization in mood, thinking, and/or behavior Need for constant or close observation no longer present  PRELIMINARY DISCHARGE PLAN: Return to previous living arrangement  PATIENT/FAMIILY INVOLVEMENT: This treatment plan has been presented to and reviewed with the patient, Ashley Stevens, and/or family member.  The patient and family have been given the opportunity to ask questions and make suggestions.  Benancio Deeds Shanta 05/07/2016, 3:01 PM

## 2016-05-07 NOTE — ED Triage Notes (Signed)
Patient complaining of depression. Patient states that she feels like she dont want to be here but dont want to hurt herself. She says she has been through a lot and does not know how to handle it.

## 2016-05-08 DIAGNOSIS — F1420 Cocaine dependence, uncomplicated: Secondary | ICD-10-CM

## 2016-05-08 DIAGNOSIS — F332 Major depressive disorder, recurrent severe without psychotic features: Secondary | ICD-10-CM

## 2016-05-08 LAB — URINALYSIS, ROUTINE W REFLEX MICROSCOPIC
Bilirubin Urine: NEGATIVE
GLUCOSE, UA: NEGATIVE mg/dL
HGB URINE DIPSTICK: NEGATIVE
Ketones, ur: NEGATIVE mg/dL
LEUKOCYTES UA: NEGATIVE
Nitrite: NEGATIVE
PH: 7 (ref 5.0–8.0)
PROTEIN: NEGATIVE mg/dL
SPECIFIC GRAVITY, URINE: 1.013 (ref 1.005–1.030)

## 2016-05-08 MED ORDER — NYSTATIN 100000 UNIT/GM EX CREA
TOPICAL_CREAM | Freq: Three times a day (TID) | CUTANEOUS | Status: DC | PRN
  Administered 2016-05-08 – 2016-05-10 (×3): via TOPICAL
  Filled 2016-05-08 (×3): qty 15

## 2016-05-08 MED ORDER — TRAZODONE HCL 50 MG PO TABS
50.0000 mg | ORAL_TABLET | Freq: Every evening | ORAL | Status: DC | PRN
  Administered 2016-05-08 – 2016-05-09 (×2): 50 mg via ORAL
  Filled 2016-05-08 (×2): qty 1

## 2016-05-08 MED ORDER — SERTRALINE HCL 50 MG PO TABS
50.0000 mg | ORAL_TABLET | Freq: Every day | ORAL | Status: DC
  Administered 2016-05-08 – 2016-05-11 (×4): 50 mg via ORAL
  Filled 2016-05-08 (×7): qty 1
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
## 2016-05-08 NOTE — [facility] Suicide Risk Assessment (Signed)
[facility] INPATIENT:  Family/Significant Other Suicide Prevention Education  Suicide Prevention Education:  Patient Refusal for Family/Significant Other Suicide Prevention Education: The patient Ashley Stevens has refused to provide written consent for family/significant other to be provided Family/Significant Other Suicide Prevention Education during admission and/or prior to discharge.  Physician notified. SPE reviewed with patient and brochure provided. Patient encouraged to return to hospital if having suicidal thoughts, patient verbalized his/her understanding and has no further questions at this time.   Rohen Kimes, Casimiro Needle 05/08/2016, 1:26 PM

## 2016-05-08 NOTE — Progress Notes (Signed)
Adult Psychoeducational Group Note  Date:  05/08/2016 Time:  4:33 PM  Group Topic/Focus:  Wellness Toolbox:   The focus of this group is to discuss various aspects of wellness, balancing those aspects and exploring ways to increase the ability to experience wellness.  Patients will create a wellness toolbox for use upon discharge.   Participation Level:  Did Not Attend  Additional Comments:  Pt did not attend. Pt was asleep.  Brexlee Heberlein A 05/08/2016, 4:33 PM
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
## 2016-05-08 NOTE — Progress Notes (Signed)
Pt c/o possible UTI or Yeast infection. Dr. Parke Poisson made aware along with Ludger Nutting, NP. Per Ludger Nutting, NP., collect UA with culture. Pt also complained of chafing under her breasts and abdomen. Writer assessed breasts and abdomen and both appeared reddened. Writer informed Dr. Parke Poisson and per MD, order nystatin cream for skin irritation.

## 2016-05-08 NOTE — Tx Team (Signed)
Interdisciplinary Treatment Plan Update (Adult) Date: 05/08/2016    Time Reviewed: 9:30 AM  Progress in Treatment: Attending groups: Continuing to assess, patient new to milieu Participating in groups: Continuing to assess, patient new to milieu Taking medication as prescribed: Yes Tolerating medication: Yes Family/Significant other contact made: No, CSW assessing for appropriate contacts Patient understands diagnosis: Yes Discussing patient identified problems/goals with staff: Yes Medical problems stabilized or resolved: Yes Denies suicidal/homicidal ideation: Yes Issues/concerns per patient self-inventory: Yes Other:  New problem(s) identified: N/A  Discharge Plan or Barriers: CSW continuing to assess, patient new to milieu.  Reason for Continuation of Hospitalization:  Depression Anxiety Medication Stabilization   Comments: N/A  Estimated length of stay: 3-5 days   Patient is a 40 year old female who presented to the hospital with depression and cocaine use. Primary triggers for admission include relationship stressor and financial stressors, and death of family members. Patient will benefit from crisis stabilization, medication evaluation, group therapy and psycho education in addition to case management for discharge planning. At discharge, it is recommended that Pt remain compliant with established discharge plan and continued treatment.   Review of initial/current patient goals per problem list:  1. Goal(s): Patient will participate in aftercare plan   Met: No   Target date: 3-5 days post admission date   As evidenced by: Patient will participate within aftercare plan AEB aftercare provider and housing plan at discharge being identified.  8/7: Goal not met: CSW assessing for appropriate referrals for pt and will have follow up secured prior to d/c.    2. Goal (s): Patient will exhibit decreased depressive symptoms and suicidal ideations.   Met: No    Target date: 3-5 days post admission date   As evidenced by: Patient will utilize self rating of depression at 3 or below and demonstrate decreased signs of depression or be deemed stable for discharge by MD.  8/7: Goal not met: Pt presents with flat affect and depressed mood.  Pt admitted with high levels of depression. Pt to show decreased sign of depression and a rating of 3 or less before d/c.     4. Goal(s): Patient will demonstrate decreased signs of withdrawal due to substance abuse   Met: Yes   Target date: 3-5 days post admission date   As evidenced by: Patient will produce a CIWA/COWS score of 0, have stable vitals signs, and no symptoms of withdrawal  8/7: Goal met. No withdrawal symptoms reported at this time per medical chart.     Attendees: Patient:    Family:    Physician: Dr. Parke Poisson; Dr. Shea Evans 05/08/2016 9:30 AM  Nursing: Kerby Nora, Darrol Angel, RN 05/08/2016 9:30 AM  Clinical Social Worker: [name] Fossa, LCSW 05/08/2016 9:30 AM  Other: Peri Maris, LCSW; [facility], LCSW  05/08/2016 9:30 AM  Other: Norberto Sorenson, P4CC 05/08/2016 9:30 AM  Other: Lars Pinks, Case Manager 05/08/2016 9:30 AM  Other: Oren Section, NP 05/08/2016 9:30 AM  Other:      Scribe for Treatment Team:  [name] Fossa, [place]
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
## 2016-05-08 NOTE — Progress Notes (Signed)
Patient ID: CARLYNN KNOPE, female   DOB: 1976/04/21, 40 y.o.   MRN: [id number]   Pt currently presents with a flat affect and anxious, guarded behavior. Pt reports to writer that their goal is to "get on the right medications." Pt hesitant to take sleep medications, requests printed information on all prescribed medications. Pt states "I don't want to be on anything that would change my mind or my brain neurons."   Pt provided with medications per providers orders. Pt's labs and vitals were monitored throughout the night. Pt supported emotionally and encouraged to express concerns and questions. Pt educated on medications and depression diagnosis. Pt refuses trazodone, takes vistaril for sleep.   Pt's safety ensured with 15 minute and environmental checks. Pt currently denies SI/HI and A/V hallucinations. Pt verbally agrees to seek staff if SI/HI or A/VH occurs and to consult with staff before acting on any harmful thoughts. Pt reports poor sleep with current medication regimen.  Will continue POC.

## 2016-05-08 NOTE — Progress Notes (Signed)
Adult Psychoeducational Group Note  Date:  05/08/2016 Time:  8:45 PM  Group Topic/Focus:  Wrap-Up Group:   The focus of this group is to help patients review their daily goal of treatment and discuss progress on daily workbooks.   Participation Level:  Active  Participation Quality:  Appropriate  Affect:  Appropriate  Cognitive:  Appropriate  Insight: Appropriate  Engagement in Group:  Engaged  Modes of Intervention:  Discussion  Additional Comments:  Pt rated her overall day a 4 out of 10 and stated that her day was "okay" because she got some rest today. Pt reported that she did not have a goal for the day.    Lincoln Brigham 05/08/2016, 9:24 PM

## 2016-05-08 NOTE — Progress Notes (Signed)
D: Pt presents with flat affect and depressed mood. Pt rates depression 9/10. Pt would not elaborate on triggers for depression. Pt denies suicidal thoughts this morning. Pt refused to take Prozac this morning stating that it makes her feel weird. Pt requested to be started back on Zoloft. Pt stated that she did not want to take any morning meds until  she spoke with MD. Pt attended afternoon group with CSW.  A: Medications reviewed with pt. Medications administered as ordered per MD. Verbal support provided. Pt encouraged to attend groups. 15 minute checks performed for safety. R: Pt receptive tx.

## 2016-05-08 NOTE — Progress Notes (Signed)
Recreation Therapy Notes  Date: 05/08/16 Time: 0930 Location: [address] Group Room  Group Topic: Stress Management  Goal Area(s) Addresses:  Patient will verbalize importance of using healthy stress management.  Patient will identify positive emotions associated with healthy stress management.   Intervention: Stress Management  Activity :  Forest Visualization.  LRT introduced pt to the technique of guided imagery.  Patients were to follow along as LRT read script to engage in the activity.   Education:  Stress Management, Discharge Planning.   Clinical Observations/Feedback: Pt did not attend group.     Shanicka Oldenkamp, LRT/CTRS  

## 2016-05-08 NOTE — BHH Group Notes (Signed)
[facility] LCSW Aftercare Discharge Planning Group Note  05/08/2016  8:45 AM  Participation Quality: Did Not Attend. Patient invited to participate but declined.  [name] Fossa, MSW, [place] Clinical Social Worker [facility] [phone]

## 2016-05-09 LAB — TSH: TSH: 2.725 u[IU]/mL (ref 0.350–4.500)

## 2016-05-09 MED ORDER — ATOMOXETINE HCL 25 MG PO CAPS
25.0000 mg | ORAL_CAPSULE | Freq: Every day | ORAL | Status: DC
  Administered 2016-05-10 – 2016-05-11 (×2): 25 mg via ORAL
  Filled 2016-05-09 (×5): qty 1

## 2016-05-09 NOTE — Plan of Care (Signed)
Problem: Coping: Goal: Ability to cope will improve Outcome: Progressing  Pt reports tolerating medication well. Pt reports plans to start school next year.

## 2016-05-09 NOTE — Progress Notes (Signed)
Adult Psychoeducational Group Note  Date:  05/09/2016 Time:  8:45 PM  Group Topic/Focus:  Wrap-Up Group:   The focus of this group is to help patients review their daily goal of treatment and discuss progress on daily workbooks.   Participation Level:  Active  Participation Quality:  Appropriate  Affect:  Appropriate  Cognitive:  Appropriate  Insight: Appropriate  Engagement in Group:  Engaged  Modes of Intervention:  Discussion  Additional Comments:  Pt rated her overall day a 6 out of 10 and stated that her day was "pretty good" because she got some rest and was feeling better today. Pt reported that she did not have a goal for the day.    Lincoln Brigham 05/09/2016, 9:21 PM

## 2016-05-09 NOTE — Progress Notes (Signed)
Recreation Therapy Notes   Animal-Assisted Activity (AAA) Program Checklist/Progress Notes Patient Eligibility Criteria Checklist & Daily Group note for Rec TxIntervention  Date: 08.08.2017 Time: 2:45pm Location: [address]    AAA/T Program Assumption of Risk Form signed by Patient/ or Parent Legal Guardian Yes  Patient is free of allergies or sever asthma Yes  Patient reports no fear of animals NO  Patient reports no history of cruelty to animals Yes  Patient understands his/her participation is voluntary Yes  Behavioral Response: Did not attend.   Laureen Ochs Lindzy Rupert, LRT/CTRS         Kayin Osment L 05/09/2016 3:10 PM

## 2016-05-09 NOTE — BHH Group Notes (Signed)
Late entry from 05/08/16:  [facility] LCSW Group Therapy Note  Date/Time: 05/08/16 1:30PM  Type of Therapy and Topic:  Group Therapy:  Holding on to Grudges  Participation Level: Active     Description of Group:    In this group patients will be asked to explore and define a grudge.  Patients will be guided to discuss their thoughts, feelings, and behaviors as to why one holds on to grudges and reasons why people have grudges. Patients will process the impact grudges have on daily life and identify thoughts and feelings related to holding on to grudges. Facilitator will challenge patients to identify ways of letting go of grudges and the benefits once released.  Patients will be confronted to address why one struggles letting go of grudges. Lastly, patients will identify feelings and thoughts related to what life would look like without grudges.  This group will be process-oriented, with patients participating in exploration of their own experiences as well as giving and receiving support and challenge from other group members.  Therapeutic Goals: 1. Patient will identify specific grudges related to their personal life. 2. Patient will identify feelings, thoughts, and beliefs around grudges. 3. Patient will identify how one releases grudges appropriately. 4. Patient will identify situations where they could have let go of the grudge, but instead chose to hold on.  Summary of Patient Progress  Patient engaged in therapeutic letter writing exercise.     Therapeutic Modalities:   Cognitive Behavioral Therapy Solution Focused Therapy Motivational Interviewing Brief Therapy    [name] Fossa, LCSW Clinical Social Worker [facility] [phone]

## 2016-05-09 NOTE — Progress Notes (Signed)
Patient ID: [place], female   DOB: June 23, 1976, 40 y.o.   MRN: [id number] D: Patient rated depression as 5 and anxiety as 3 on 0-10 scale. Pt mood and affect appeared depressed and anxious. Pt reports tolerating medication well. Pt denies SI/HI/AVH and pain. Pt attended and participated in evening wrap up group. Pt reports learning coping skills and plan to start school next year. Cooperative with assessment.   A: Met with pt 1:1. Medications administered as prescribed. Support and encouragement provided. Pt encouraged to discuss feelings and come to staff with any question or concerns.   R: Patient remains safe and complaint with medications.

## 2016-05-09 NOTE — BHH Group Notes (Signed)
[place] LCSW Group Therapy  05/09/2016   1:15 PM   Type of Therapy:  Group Therapy  Participation Level:  Active  Participation Quality:  Attentive, Sharing and Supportive  Affect:  Depressed and Flat  Cognitive:  Alert and Oriented  Insight:  Developing/Improving and Engaged  Engagement in Therapy:  Developing/Improving and Engaged  Modes of Intervention:  Clarification, Confrontation, Discussion, Education, Exploration, Limit-setting, Orientation, Problem-solving, Rapport Building, Art therapist, Socialization and Support  Summary of Progress/Problems: The topic for group therapy was feelings about diagnosis.  Pt actively participated in group discussion on their past and current diagnosis and how they feel towards this.  Pt also identified how society and family members judge them, based on their diagnosis as well as stereotypes and stigmas.  Patient shared that she is kind and forgiving (sometimes to a fault) but that her primary obstacle at this time is dealing with suspected ADHD.   [name] Fossa, MSW, [place] Clinical Social Worker [facility] [phone]

## 2016-05-09 NOTE — Progress Notes (Signed)
BHH MD Progress Note  05/09/2016 6:14 PM [place]  MRN:  [id number] Subjective:  Patient reports partial improvement of mood, states she is feeling better. She states she has a long history of ADHD type symptoms, mainly distractibility, inability to focus, which has caused disruption to academic and work related tasks in the past . States " It's like I can never finish anything I start doing ". States that frustration from this issue increases her depression . Denies any medication side effects. Denies suicidal ideations . Objective : I have discussed case with treatment team and have met with patient . Patient presents improved compared to admission - states she feels less depressed, less anxious . As noted, currently reporting ADHD type symptoms as exacerbating her depression . No disruptive or agitated behaviors on the unit. Denies medication side effects. She has been going to groups, visible on unit, as per staff presents with a somewhat depressed, sad affect . Labs- TSH WNL, UA unremarkable  Principal Problem:  MDD  Diagnosis:   Patient Active Problem List   Diagnosis Date Noted  . 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 Cocaine use disorder, moderate, dependence (HCC) [F14.20] 05/07/2016  . Cannabis use disorder, moderate, dependence (HCC) [F12.20] 05/07/2016  . MDD (major depressive disorder) (HCC) [F32.9] 05/07/2016   Total Time spent with patient: 20 minutes    Past Medical History: History reviewed. No pertinent past medical history.  Past Surgical History:  Procedure Laterality Date  . c-section     Family History: History reviewed. No pertinent family history.  Social History:  History  Alcohol Use No     History  Drug Use  . Types: Marijuana    Social History   Social History  . Marital status: Married    Spouse name: N/A  . Number of children: N/A  . Years of education: N/A   Social History Main Topics   . Smoking status: Current Every Day Smoker    Types: Cigarettes  . Smokeless tobacco: Never Used  . Alcohol use No  . Drug use:     Types: Marijuana  . Sexual activity: Yes    Birth control/ protection: Condom   Other Topics Concern  . None   Social History Narrative  . None   Additional Social History:    Pain Medications: none Prescriptions: none Over the Counter: none History of alcohol / drug use?: Yes Negative Consequences of Use: Financial  Sleep: improving  Appetite:  improving   Current Medications: Current Facility-Administered Medications  Medication Dose Route Frequency Provider Last Rate Last Dose  . acetaminophen (TYLENOL) tablet 650 mg  650 mg Oral Q6H PRN Shuvon B Rankin, NP      . alum & mag hydroxide-simeth (MAALOX/MYLANTA) 200-200-20 MG/5ML suspension 30 mL  30 mL Oral Q4H PRN Shuvon B Rankin, NP      . [START ON 05/10/2016] atomoxetine (STRATTERA) capsule 25 mg  25 mg Oral Daily  A , MD      . gabapentin (NEURONTIN) capsule 200 mg  200 mg Oral BID Shuvon B Rankin, NP   200 mg at 05/09/16 1813  . hydrOXYzine (ATARAX/VISTARIL) tablet 25 mg  25 mg Oral Q6H PRN Agnes I Nwoko, NP   25 mg at 05/09/16 0346  . magnesium hydroxide (MILK OF MAGNESIA) suspension 30 mL  30 mL Oral Daily PRN Shuvon B Rankin, NP   30 mL at 05/08/16 1832  . nicotine (NICODERM CQ - dosed in mg/24   hours) patch 14 mg  14 mg Transdermal Daily PRN Merian Capron, MD   14 mg at 05/09/16 1418  . nystatin cream (MYCOSTATIN)   Topical TID PRN Jenne Campus, MD      . sertraline (ZOLOFT) tablet 50 mg  50 mg Oral Daily Jenne Campus, MD   50 mg at 05/09/16 0803  . traZODone (DESYREL) tablet 50 mg  50 mg Oral QHS PRN Jenne Campus, MD   50 mg at 05/08/16 2145    Lab Results:  Results for orders placed or performed during the hospital encounter of 05/07/16 (from the past 48 hour(s))  Urinalysis, Routine w reflex microscopic (not at [facility])     Status: None   Collection Time:  05/08/16  6:28 PM  Result Value Ref Range   Color, Urine YELLOW YELLOW   APPearance CLEAR CLEAR   Specific Gravity, Urine 1.013 1.005 - 1.030   pH 7.0 5.0 - 8.0   Glucose, UA NEGATIVE NEGATIVE mg/dL   Hgb urine dipstick NEGATIVE NEGATIVE   Bilirubin Urine NEGATIVE NEGATIVE   Ketones, ur NEGATIVE NEGATIVE mg/dL   Protein, ur NEGATIVE NEGATIVE mg/dL   Nitrite NEGATIVE NEGATIVE   Leukocytes, UA NEGATIVE NEGATIVE    Comment: MICROSCOPIC NOT DONE ON URINES WITH NEGATIVE PROTEIN, BLOOD, LEUKOCYTES, NITRITE, OR GLUCOSE <1000 mg/dL. Performed at [facility]   TSH     Status: None   Collection Time: 05/09/16  6:32 AM  Result Value Ref Range   TSH 2.725 0.350 - 4.500 uIU/mL    Comment: Performed at [facility]    Blood Alcohol level:  Lab Results  Component Value Date   [facility] <5 29/11/1113    Metabolic Disorder Labs: No results found for: HGBA1C, MPG No results found for: PROLACTIN No results found for: CHOL, TRIG, HDL, CHOLHDL, VLDL, LDLCALC  Physical Findings: AIMS:  , ,  ,  ,    CIWA:    COWS:     Musculoskeletal: Strength & Muscle Tone: within normal limits Gait & Station: normal Patient leans: N/A  Psychiatric Specialty Exam: Physical Exam  ROS denies headache, denies chest pain, no shortness of breath, no vomiting   Blood pressure 122/84, pulse 76, temperature 98.8 F (37.1 C), temperature source Oral, resp. rate 16, height 6' (1.829 m), weight 260 lb (117.9 kg), last menstrual period 04/30/2016.Body mass index is 35.26 kg/m.  General Appearance: Fairly Groomed  Eye Contact:  Good  Speech:  Normal Rate  Volume:  Normal  Mood:  reports feeling better today, still presents somewhat depressed   Affect:  mildly constricted, but does smile at times appropriately   Thought Process:  Linear  Orientation:  Other:  denies hallucinations, no delusions expressed   Thought Content:  denies hallucinations, no delusions, not internally  preoccupied  Suicidal Thoughts:  No at this time denies any suicidal ideations, denies any self injurious ideations   Homicidal Thoughts:  No denies homicidal or violent ideations   Memory:  recent and remote grossly intact   Judgement:  Improving   Insight:  Fair  Psychomotor Activity:  Normal  Concentration:  Concentration: Good and Attention Span: Good  Recall:  Good  Fund of Knowledge:  Good  Language:  Good  Akathisia:  Negative  Handed:  Left  AIMS (if indicated):     Assets:  Desire for Improvement Resilience  ADL's:  Intact  Cognition:  WNL  Sleep:  Number of Hours: 5.75    Assessment -  patient reports improving mood, and presents with a more reactive affect. Denies suicidal ideations at this time, seems less ruminative about losses today. Today more focused on chronic ADHD symptoms, which she describes as chronic-  distractibility and inattention- and disruptive to her ability to work. She is interested in treatment for this- we discussed options and rationale to avoid potentially addictive stimulant medication . Agrees to Atomoxetine trial- side effects discussed .   Treatment Plan Summary: Daily contact with patient to assess and evaluate symptoms and progress in treatment, Medication management, Plan inpatient treatment  and medications as below   Encourage ongoing group and milieu participation to work on coping skills and symptom reduction Continue Zoloft 50 mgrs QDAY for depression , anxiety Start Atomoxetine 25 mgrs QAM for ADHD symptoms, side effects discussed  Continue Trazodone 50 mgrs QHS PRN for insomnia as needed  Continue Neurontin 200 mgrs BID for anxiety Treatment team working on disposition planning   , , MD 05/09/2016, 6:14 PM 

## 2016-05-09 NOTE — Progress Notes (Signed)
D: [place] was pleasant upon approach this a.m. She denied SI/HI/AVH. She has been isolative to her room for much of the a.m. She wondered if her Zoloft would be increased to levels she took at a time before.   A: Meds given as ordered, including PRN smoking patch. Q15 safety checks maintained. Support/encouragement offered.  R: Pt remains free from harm and continues with treatment. Will continue to monitor for needs/safety.

## 2016-05-09 NOTE — BHH Group Notes (Signed)
[place] Group Notes:  (Nursing/MHT/Case Management/Adjunct)  Date:  05/09/2016  Time:  10:03 AM  Type of Therapy:  Psychoeducational Skills  Participation Level:  Did Not Attend  Patient invited; declined to attend.  Zipporah Plants 05/09/2016, 10:03 AM

## 2016-05-10 MED ORDER — NICOTINE 21 MG/24HR TD PT24
21.0000 mg | MEDICATED_PATCH | Freq: Every day | TRANSDERMAL | Status: DC
  Administered 2016-05-10: 21 mg via TRANSDERMAL
  Filled 2016-05-10 (×2): qty 1

## 2016-05-10 MED ORDER — IBUPROFEN 400 MG PO TABS
400.0000 mg | ORAL_TABLET | Freq: Four times a day (QID) | ORAL | Status: DC | PRN
  Administered 2016-05-11: 400 mg via ORAL
  Filled 2016-05-10: qty 1

## 2016-05-10 MED ORDER — GABAPENTIN 100 MG PO CAPS
200.0000 mg | ORAL_CAPSULE | Freq: Three times a day (TID) | ORAL | Status: DC
  Administered 2016-05-10 – 2016-05-11 (×3): 200 mg via ORAL
  Filled 2016-05-10 (×8): qty 2

## 2016-05-10 MED ORDER — NICOTINE 14 MG/24HR TD PT24
14.0000 mg | MEDICATED_PATCH | Freq: Every day | TRANSDERMAL | Status: DC
  Administered 2016-05-11: 14 mg via TRANSDERMAL
  Filled 2016-05-10 (×4): qty 1

## 2016-05-10 MED ORDER — ZOLPIDEM TARTRATE 5 MG PO TABS
5.0000 mg | ORAL_TABLET | Freq: Every evening | ORAL | Status: DC | PRN
  Administered 2016-05-10: 5 mg via ORAL
  Filled 2016-05-10: qty 1

## 2016-05-10 NOTE — Progress Notes (Signed)
D: Pt denies SI/HI/AVH. Pt is pleasant and cooperative. Pt anxious, presents worrying about her issues in life. Pt stated she was overwhelmed before coming in and all the various stressors caused her to not be able to cope.   A: Pt was offered support and encouragement. Pt was given scheduled medications. Pt was encourage to attend groups. Q 15 minute checks were done for safety.   R:Pt attends groups and interacts well with peers and staff. Pt is taking medication. Pt has no complaints.Pt receptive to treatment and safety maintained on unit.

## 2016-05-10 NOTE — Progress Notes (Signed)
Patient ID: Ashley Stevens, female   DOB: [birth year], 40 y.o.   MRN: [id number] D: Patient reported her day was well and getting better daily. Pt mood and affect is appropriate to situation. Pt reports tolerating medication well. Pt denies SI/HI/AVH and pain. Pt attended and participated in evening wrap up group. Cooperative with assessment.   A: Met with pt 1:1. Medications administered as prescribed. Support and encouragement provided. Pt encouraged to discuss feelings and come to staff with any question or concerns.   R: Patient remains safe and complaint with medications.

## 2016-05-10 NOTE — Progress Notes (Signed)
Patient up and visible in milieu. Restless at times. Observed on the phone between groups. Affect anxious with congruent mood. Complained of poor sleep and states she feels the trazadone interfered with sleep. Also complaining of reflux and constipation. Denies pain.  Medicated per orders. MOM and vistaril prn given for complaints. Emotional support offered. Encouraged completion of Self Inventory as well as Suicide Prevention Plan.   On reassess, patient's anxiety is decreased. No results as of yet with MOM however patient denies discomfort. Refusing to complete therapeutic activities listed above. Denies SI/HI and remains safe on level III obs.

## 2016-05-10 NOTE — Progress Notes (Signed)
Adult Psychoeducational Group Note  Date:  05/10/2016 Time:  9:38 PM  Group Topic/Focus:  Wrap-Up Group:   The focus of this group is to help patients review their daily goal of treatment and discuss progress on daily workbooks.   Participation Level:  Active  Participation Quality:  Appropriate  Affect:  Appropriate  Cognitive:  Alert  Insight: Appropriate  Engagement in Group:  Engaged  Modes of Intervention:  Discussion  Additional Comments:  On a scale between 1-10, (1=worse, 10=best) patient rated her day a 7. Maevyn Riordan L Danise Dehne 05/10/2016, 9:38 PM

## 2016-05-10 NOTE — Progress Notes (Signed)
Recreation Therapy Notes  Date: 05/10/16  Time: 0930 Location: [address] Group Room  Group Topic: Stress Management  Goal Area(s) Addresses:  Patient will verbalize importance of using healthy stress management.  Patient will identify positive emotions associated with healthy stress management.   Intervention: Stress Management  Activity :  Deep Breathing.  LRT introduced patients to the stress management technique of deep breathing.  Pt were to follow along as LRT demonstrated and read a script to allow patients to participate in the activity.  Education:  Stress Management, Discharge Planning.   Education Outcome: Acknowledges edcuation/In group clarification offered/Needs additional education  Clinical Observations/Feedback: Pt did not attend group.    Ansh Fauble, LRT/CTRS  

## 2016-05-10 NOTE — Progress Notes (Addendum)
Patient ID: Ashley Stevens, female   DOB: 1976-08-15, 40 y.o.   MRN: [id number] [facility] MD Progress Note  05/10/2016 4:58 PM Ashley Stevens  MRN:  [id number] Subjective:  Patient reports improvement compared to how she felt prior to admission, but states she continues to feel depressed and in particular vaguely anxious. States Trazodone not effective for her insomnia, and causing her some vague side effects in AM, wants to stop this medication. Tolerating other medications well, denies side effects.   Thus far tolerating Atomoxetine trial well, which was started yesterday for ADHD symptoms- denies side effects .  Objective : I have discussed case with treatment team and have met with patient . Patient presents with partially improving mood, but reports she still feels vaguely depressed and anxious. At this time denies suicidal ideations. As above, states she is not tolerating Trazodone well and wants to stop this medication - we discussed other options, agrees to Ambien .  No disruptive or agitated behaviors on the unit. Going to some groups .  Principal Problem:  MDD  Diagnosis:   Patient Active Problem List   Diagnosis Date Noted  . Major depressive disorder without psychotic features ([place]) [F32.9] 05/07/2016  . Cocaine use disorder, moderate, dependence ([place]) [F14.20] 05/07/2016  . Cannabis use disorder, moderate, dependence ([place]) [F12.20] 05/07/2016  . MDD (major depressive disorder) ([place]) [F32.9] 05/07/2016   Total Time spent with patient: 20 minutes    Past Medical History: History reviewed. No pertinent past medical history.  Past Surgical History:  Procedure Laterality Date  . c-section     Family History: History reviewed. No pertinent family history.  Social History:  History  Alcohol Use No     History  Drug Use  . Types: Marijuana    Social History   Social History  . Marital status: Married    Spouse name: N/A  . Number of children: N/A  . Years of  education: N/A   Social History Main Topics  . Smoking status: Current Every Day Smoker    Types: Cigarettes  . Smokeless tobacco: Never Used  . Alcohol use No  . Drug use:     Types: Marijuana  . Sexual activity: Yes    Birth control/ protection: Condom   Other Topics Concern  . None   Social History Narrative  . None   Additional Social History:    Pain Medications: none Prescriptions: none Over the Counter: none History of alcohol / drug use?: Yes Negative Consequences of Use: Financial  Sleep: fair   Appetite:  improving   Current Medications: Current Facility-Administered Medications  Medication Dose Route Frequency Provider Last Rate Last Dose  . alum & mag hydroxide-simeth (MAALOX/MYLANTA) 200-200-20 MG/5ML suspension 30 mL  30 mL Oral Q4H PRN Shuvon B Rankin, NP      . atomoxetine (STRATTERA) capsule 25 mg  25 mg Oral Daily Jenne Campus, MD   25 mg at 05/10/16 0810  . gabapentin (NEURONTIN) capsule 200 mg  200 mg Oral TID Jenne Campus, MD      . hydrOXYzine (ATARAX/VISTARIL) tablet 25 mg  25 mg Oral Q6H PRN Encarnacion Slates, NP   25 mg at 05/10/16 1314  . ibuprofen (ADVIL,MOTRIN) tablet 400 mg  400 mg Oral Q6H PRN Myer Peer Cobos, MD      . magnesium hydroxide (MILK OF MAGNESIA) suspension 30 mL  30 mL Oral Daily PRN Shuvon B Rankin, NP   30 mL at 05/10/16 1314  .  nicotine (NICODERM CQ - dosed in mg/24 hours) patch 14 mg  14 mg Transdermal Daily PRN Merian Capron, MD   14 mg at 05/09/16 1418  . nicotine (NICODERM CQ - dosed in mg/24 hours) patch 14 mg  14 mg Transdermal Daily Myer Peer Cobos, MD      . nystatin cream (MYCOSTATIN)   Topical TID PRN Jenne Campus, MD      . sertraline (ZOLOFT) tablet 50 mg  50 mg Oral Daily Jenne Campus, MD   50 mg at 05/10/16 0810  . zolpidem (AMBIEN) tablet 5 mg  5 mg Oral QHS PRN Jenne Campus, MD        Lab Results:  Results for orders placed or performed during the hospital encounter of 05/07/16 (from the  past 48 hour(s))  Urine culture     Status: Abnormal (Preliminary result)   Collection Time: 05/08/16  6:28 PM  Result Value Ref Range   Specimen Description      URINE, RANDOM Performed at [place] Performed at [facility]    Culture 50,000 COLONIES/mL [place] (A)    Report Status PENDING   Urinalysis, Routine w reflex microscopic (not at [facility])     Status: None   Collection Time: 05/08/16  6:28 PM  Result Value Ref Range   Color, Urine YELLOW YELLOW   APPearance CLEAR CLEAR   Specific Gravity, Urine 1.013 1.005 - 1.030   pH 7.0 5.0 - 8.0   Glucose, UA NEGATIVE NEGATIVE mg/dL   Hgb urine dipstick NEGATIVE NEGATIVE   Bilirubin Urine NEGATIVE NEGATIVE   Ketones, ur NEGATIVE NEGATIVE mg/dL   Protein, ur NEGATIVE NEGATIVE mg/dL   Nitrite NEGATIVE NEGATIVE   Leukocytes, UA NEGATIVE NEGATIVE    Comment: MICROSCOPIC NOT DONE ON URINES WITH NEGATIVE PROTEIN, BLOOD, LEUKOCYTES, NITRITE, OR GLUCOSE <1000 mg/dL. Performed at [facility]   TSH     Status: None   Collection Time: 05/09/16  6:32 AM  Result Value Ref Range   TSH 2.725 0.350 - 4.500 uIU/mL    Comment: Performed at [facility]    Blood Alcohol level:  Lab Results  Component Value Date   [facility] <5 08/17/5207    Metabolic Disorder Labs: No results found for: HGBA1C, MPG No results found for: PROLACTIN No results found for: CHOL, TRIG, HDL, CHOLHDL, VLDL, LDLCALC  Physical Findings: AIMS:  , ,  ,  ,    CIWA:    COWS:     Musculoskeletal: Strength & Muscle Tone: within normal limits Gait & Station: normal Patient leans: N/A  Psychiatric Specialty Exam: Physical Exam  ROS denies headache, denies chest pain, no shortness of breath, no vomiting   Blood pressure 122/84, pulse 76, temperature 98.8 F (37.1 C), temperature source Oral, resp. rate 16, height 6' (1.829 m), weight 260 lb (117.9 kg),  last menstrual period 04/30/2016.Body mass index is 35.26 kg/m.  General Appearance: Fairly Groomed  Eye Contact:  Good  Speech:  Normal Rate  Volume:  Normal  Mood: remains depressed, states she has improved partially   Affect:  Constricted, but reactive, smiles at times appropriately   Thought Process:  Linear  Orientation:  Other:  denies hallucinations, no delusions expressed   Thought Content:  denies hallucinations, no delusions, not internally preoccupied  Suicidal Thoughts:  No at this time denies any suicidal ideations, denies any self  injurious ideations   Homicidal Thoughts:  No denies homicidal or violent ideations   Memory:  recent and remote grossly intact   Judgement:  Improving   Insight:  Fair  Psychomotor Activity:  Normal  Concentration:  Concentration: Good and Attention Span: Good  Recall:  Good  Fund of Knowledge:  Good  Language:  Good  Akathisia:  Negative  Handed:  Left  AIMS (if indicated):     Assets:  Desire for Improvement Resilience  ADL's:  Intact  Cognition:  WNL  Sleep:  Number of Hours: 6.15    Assessment - patient reports partially improved mood, but persistence of depression, anxiety, and poor sleep. She emphasizes anxiety as a significant symptom, and states she feels Neurontin is helpful and well tolerated . Has reported chronic ADHD type symptoms, mainly inattention and difficulty focusing, thus far tolerating Atomoxetine trial well .  Treatment Plan Summary: Daily contact with patient to assess and evaluate symptoms and progress in treatment, Medication management, Plan inpatient treatment  and medications as below   Encourage ongoing group and milieu participation to work on coping skills and symptom reduction Continue Zoloft 50 mgrs QDAY for depression , anxiety Continue Atomoxetine 25 mgrs QAM for ADHD symptoms D/C  Trazodone Start Ambien 5 mgrs QHS PRN for insomnia as needed  Increase  Neurontin to  200 mgrs TID  for anxiety Patient  reports NSAID, Ibuprofen is well tolerated, and more helpful than Acetaminophen for management of minor/ moderate pain . Treatment team working on disposition planning   Neita Garnet, MD 05/10/2016, 4:58 PM

## 2016-05-10 NOTE — Plan of Care (Signed)
Problem: Safety: Goal: Ability to disclose and discuss suicidal ideas will improve Outcome: Progressing Patient denies SI.  Problem: Medication: Goal: Compliance with prescribed medication regimen will improve Outcome: Progressing Patient is med compliant.

## 2016-05-11 DIAGNOSIS — F332 Major depressive disorder, recurrent severe without psychotic features: Secondary | ICD-10-CM

## 2016-05-11 LAB — URINE CULTURE

## 2016-05-11 MED ORDER — SERTRALINE HCL 50 MG PO TABS: 50.0000 mg | ORAL_TABLET | Freq: Every day | ORAL | 0 refills | Status: DC

## 2016-05-11 MED ORDER — NYSTATIN 100000 UNIT/GM EX CREA: TOPICAL_CREAM | Freq: Three times a day (TID) | CUTANEOUS | 0 refills | Status: DC | PRN

## 2016-05-11 MED ORDER — GABAPENTIN 100 MG PO CAPS: 200.0000 mg | ORAL_CAPSULE | Freq: Three times a day (TID) | ORAL | 0 refills | Status: DC

## 2016-05-11 MED ORDER — NICOTINE 14 MG/24HR TD PT24: 14.0000 mg | MEDICATED_PATCH | Freq: Every day | TRANSDERMAL | 0 refills | Status: DC | PRN

## 2016-05-11 MED ORDER — ATOMOXETINE HCL 25 MG PO CAPS: 25.0000 mg | ORAL_CAPSULE | Freq: Every day | ORAL | 0 refills | Status: DC

## 2016-05-11 MED ORDER — HYDROXYZINE HCL 25 MG PO TABS: 25.0000 mg | ORAL_TABLET | Freq: Four times a day (QID) | ORAL | 0 refills | Status: DC | PRN

## 2016-05-11 MED ORDER — ZOLPIDEM TARTRATE 5 MG PO TABS: 5.0000 mg | ORAL_TABLET | Freq: Every evening | ORAL | 0 refills | Status: DC | PRN

## 2016-05-11 NOTE — Progress Notes (Signed)
  [facility] Adult Case Management Discharge Plan :  Will you be returning to the same living situation after discharge:  Yes,  patient plans to return home At discharge, do you have transportation home?: Yes,  patient's car is at the hospital Do you have the ability to pay for your medications: Yes,  patient will be provided with prescriptions at discharge  Release of information consent forms completed and in the chart;  Patient's signature needed at discharge.  Patient to Follow up at: Follow-up Information    [facility] Follow up on 05/19/2016.   Why:  Assessment for therapy and medication management services with counselor Hildred Alamin on Friday August 18th at 9:30am. Please bring insurance card and call office if you need to reschedule.  Contact information: [employer]  [phone]          Next level of care provider has access to [place] and Suicide Prevention discussed: Yes,  with patient   Have you used any form of tobacco in the last 30 days? (Cigarettes, Smokeless Tobacco, Cigars, and/or Pipes): Yes  Has patient been referred to the Quitline?: Patient refused referral  Patient has been referred for addiction treatment: Yes  Akshat Minehart L Ola Fawver 05/11/2016, 10:06 AM
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
## 2016-05-11 NOTE — Plan of Care (Signed)
Problem: Education: Goal: Emotional status will improve Outcome: Progressing Patient reports decreased depressive symptoms.  She denies any self harm thoughts.

## 2016-05-11 NOTE — Tx Team (Addendum)
Interdisciplinary Treatment Plan Update (Adult) Date: 05/11/2016    Time Reviewed: 9:30 AM  Progress in Treatment: Attending groups: Yes Participating in groups: Yes Taking medication as prescribed: Yes Tolerating medication: Yes Family/Significant other contact made: No, patient has declined for collateral contact Patient understands diagnosis: Yes Discussing patient identified problems/goals with staff: Yes Medical problems stabilized or resolved: Yes Denies suicidal/homicidal ideation: Yes Issues/concerns per patient self-inventory: Yes Other:  New problem(s) identified: N/A  Discharge Plan or Barriers: Patient plans to return home to follow up with outpatient services.   Reason for Continuation of Hospitalization:  Depression Anxiety Medication Stabilization   Comments: N/A  Estimated length of stay: Discharge anticipated for today 05/11/16   Patient is a 40 year old female who presented to the hospital with depression and cocaine use. Primary triggers for admission include relationship stressor and financial stressors, and death of family members. Patient will benefit from crisis stabilization, medication evaluation, group therapy and psycho education in addition to case management for discharge planning. At discharge, it is recommended that Pt remain compliant with established discharge plan and continued treatment.   Review of initial/current patient goals per problem list:  1. Goal(s): Patient will participate in aftercare plan   Met: Yes   Target date: 3-5 days post admission date   As evidenced by: Patient will participate within aftercare plan AEB aftercare provider and housing plan at discharge being identified.  8/7: Goal not met: CSW assessing for appropriate referrals for pt and will have follow up secured prior to d/c.  8/10: Goal met. Patient plans to return home to follow up with outpatient services.    2. Goal (s): Patient will exhibit decreased  depressive symptoms and suicidal ideations.   Met: Adequate for discharge per MD   Target date: 3-5 days post admission date   As evidenced by: Patient will utilize self rating of depression at 3 or below and demonstrate decreased signs of depression or be deemed stable for discharge by MD.  8/7: Goal not met: Pt presents with flat affect and depressed mood.  Pt admitted with high levels of depression. Pt to show decreased sign of depression and a rating of 3 or less before d/c.    8/10: Adequate for discharge per MD. Patient reports improvement in her symptoms, denies SI. She reports feeling safe for discharge today.    4. Goal(s): Patient will demonstrate decreased signs of withdrawal due to substance abuse   Met: Yes   Target date: 3-5 days post admission date   As evidenced by: Patient will produce a CIWA/COWS score of 0, have stable vitals signs, and no symptoms of withdrawal  8/7: Goal met. No withdrawal symptoms reported at this time per medical chart.     Attendees: Patient:    Family:    Physician: Dr. Parke Poisson; Dr. Modesta Messing; Dr. Shea Evans 05/11/2016 9:30 AM  Nursing: Grayland Ormond, Mayra Neer, [place], RN 05/11/2016 9:30 AM  Clinical Social Worker: Erasmo Downer Lysle Yero, LCSW 05/11/2016 9:30 AM  Other: Peri Maris, LCSW; [place], LCSW  05/11/2016 9:30 AM  Other: 05/11/2016 9:30 AM  Other:  05/11/2016 9:30 AM  Other: Agustina Caroli, May Augustin, NP 05/11/2016 9:30 AM  Other:      Scribe for Treatment Team:  [name] Fossa, [place]

## 2016-05-11 NOTE — Progress Notes (Addendum)
Patient ID: Ashley Stevens, female   DOB: May 24, 1976, 40 y.o.   MRN: [id number] Discharge note:  Patient to be discharged home per MD order.  She will receive all personal belongings from room and unit.  Reviewed AVS/transition record with patient and she indicated understanding. She will follow up with [facility] on 05/19/16.  Patient denies SI/HI/AVH.  Patient rates her depression, hopelessness and anxiety as 5.  She is sleeping and eating well.  Patient is having a friend pick her up around 1400. Patient left at 1310.

## 2016-05-11 NOTE — [facility]HH Suicide Risk Assessment (Signed)
[facility] Discharge Suicide Risk Assessment   Principal Problem: Depression, Cocaine Abuse  Discharge Diagnoses:  Patient Active Problem List   Diagnosis Date Noted  . Major depressive disorder without psychotic features ([place]) [F32.9] 05/07/2016  . Cocaine use disorder, moderate, dependence ([place]) [F14.20] 05/07/2016  . Cannabis use disorder, moderate, dependence ([place]) [F12.20] 05/07/2016  . MDD (major depressive disorder) ([place]) [F32.9] 05/07/2016    Total Time spent with patient: 30 minutes  Musculoskeletal: Strength & Muscle Tone: within normal limits Gait & Station: normal Patient leans: N/A  Psychiatric Specialty Exam: ROS  Blood pressure 113/72, pulse 92, temperature 98.9 F (37.2 C), temperature source Oral, resp. rate 18, height 6' (1.829 m), weight 260 lb (117.9 kg), last menstrual period 04/30/2016.Body mass index is 35.26 kg/m.  General Appearance: improved grooming   Eye Contact::  Good  Speech:  Normal Rate409  Volume:  Normal  Mood:  improving mood, states she feels better today  Affect:  Appropriate and more reactive   Thought Process:  Linear  Orientation:  Full (Time, Place, and Person)  Thought Content:  denies hallucinations, no delusions, not internaly preoccupied   Suicidal Thoughts:  No  Homicidal Thoughts:  No  Memory:  recent and remote grossly intact   Judgement:  Other:  improved   Insight:  improved   Psychomotor Activity:  Normal  Concentration:  Good  Recall:  Good  Fund of Knowledge:Good  Language: Good  Akathisia:  Negative  Handed:  Right  AIMS (if indicated):     Assets:  Communication Skills Desire for Improvement Resilience  Sleep:  Number of Hours: 6  Cognition: WNL  ADL's:  Intact   Mental Status Per Nursing Assessment::   On Admission:  NA  Demographic Factors:  40 year old separated female , has two children  Loss Factors: death of loved ones over recent years - history of separation, SO was abusing drugs   Historical  Factors: History of depression, history of cocaine abuse   Risk Reduction Factors:   Sense of responsibility to family and Positive coping skills or problem solving skills  Continued Clinical Symptoms:  At this time patient reports improvement compared to admission - presents with improving mood and fuller more reactive affect, no thought disorder, no suicidal or self injurious ideations, future oriented . Denies medication side effects.   Cognitive Features That Contribute To Risk:  No gross cognitive deficits noted upon discharge. Is alert , attentive, and oriented x 3   Suicide Risk:  Mild:  Suicidal ideation of limited frequency, intensity, duration, and specificity.  There are no identifiable plans, no associated intent, mild dysphoria and related symptoms, good self-control (both objective and subjective assessment), few other risk factors, and identifiable protective factors, including available and accessible social support.  Follow-up Information    [facility] Follow up on 05/19/2016.   Why:  Assessment for therapy and medication management services with counselor Hildred Alamin on Friday August 18th at 9:30am. Please bring insurance card and call office if you need to reschedule.  Contact information: [place]  [phone]          Plan Of Care/Follow-up recommendations:  Activity:  as tolerated  Diet:  Regular  Tests:  NA Other:  See below   Patient is leaving unit in good spirits . Plans to return home. Follow up as above . Encouraged to abstain from illicit drugs, and to avoid people, places and situations she associates with drug use  in order to minimize risk of relapse   Neita Garnet, MD 05/11/2016, 1:38 PM

## 2016-06-07 ENCOUNTER — Emergency Department (HOSPITAL_COMMUNITY)
Admission: EM | Admit: 2016-06-07 | Discharge: 2016-06-07 | Discharge status: Against Medical Advice | Payer: No Typology Code available for payment source

## 2016-06-07 NOTE — ED Triage Notes (Signed)
Pt called,no answer.

## 2016-06-07 NOTE — ED Notes (Signed)
Pt called again from triage, no answer 

## 2016-09-28 ENCOUNTER — Encounter (HOSPITAL_COMMUNITY): Payer: Self-pay | Admitting: Emergency Medicine

## 2016-09-28 ENCOUNTER — Emergency Department (HOSPITAL_COMMUNITY): Payer: Medicaid Other

## 2016-09-28 ENCOUNTER — Emergency Department (HOSPITAL_COMMUNITY)
Admission: EM | Admit: 2016-09-28 | Discharge: 2016-09-28 | Disposition: A | Discharge status: Home / Self Care | Payer: Medicaid Other | Attending: Emergency Medicine | Admitting: Emergency Medicine

## 2016-09-28 DIAGNOSIS — F17210 Nicotine dependence, cigarettes, uncomplicated: Secondary | ICD-10-CM | POA: Insufficient documentation

## 2016-09-28 DIAGNOSIS — Z79899 Other long term (current) drug therapy: Secondary | ICD-10-CM | POA: Diagnosis not present

## 2016-09-28 DIAGNOSIS — R05 Cough: Secondary | ICD-10-CM | POA: Diagnosis present

## 2016-09-28 DIAGNOSIS — R059 Cough, unspecified: Secondary | ICD-10-CM

## 2016-09-28 DIAGNOSIS — J069 Acute upper respiratory infection, unspecified: Secondary | ICD-10-CM | POA: Diagnosis not present

## 2016-09-28 MED ORDER — BENZONATATE 100 MG PO CAPS: 100.0000 mg | ORAL_CAPSULE | Freq: Three times a day (TID) | ORAL | 0 refills | Status: DC | PRN

## 2016-09-28 MED ORDER — BENZONATATE 100 MG PO CAPS
100.0000 mg | ORAL_CAPSULE | Freq: Once | ORAL | Status: AC
  Administered 2016-09-28: 100 mg via ORAL
  Filled 2016-09-28: qty 1

## 2016-09-28 NOTE — ED Provider Notes (Signed)
[name] DEPT Provider Note   CSN: [id number] Arrival date & time: 09/28/16  [id number]     History   Chief Complaint Chief Complaint  Patient presents with  . URI    HPI Ashley Stevens is a 40 y.o. female.  The history is provided by the patient and medical records. No language interpreter was used.  URI   Associated symptoms include congestion and cough. Pertinent negatives include no chest pain and no neck pain.   Ashley Stevens is a 40 y.o. female who presents to the Emergency Department complaining of persistent productive cough, nasal congestion and generalized headache 5 days. Mucinex tried home relief. + 1/2 ppd smoker. No alleviating or aggravating factors noted. Denies fever, chest pain, shortness of breath.   History reviewed. No pertinent past medical history.  Patient Active Problem List   Diagnosis Date Noted  . Severe episode of recurrent major depressive disorder, without psychotic features (Ashley Stevens)   . Major depressive disorder without psychotic features 05/07/2016  . Cocaine use disorder, moderate, dependence (Ashley Stevens) 05/07/2016  . Cannabis use disorder, moderate, dependence (Ashley Stevens) 05/07/2016  . MDD (major depressive disorder) 05/07/2016    Past Surgical History:  Procedure Laterality Date  . c-section      OB History    No data available       Home Medications    Prior to Admission medications   Medication Sig Start Date End Date Taking? Authorizing Provider  atomoxetine (STRATTERA) 25 MG capsule Take 1 capsule (25 mg total) by mouth daily. For ADHD 05/11/16   Encarnacion Slates, NP  benzonatate (TESSALON) 100 MG capsule Take 1 capsule (100 mg total) by mouth 3 (three) times daily as needed for cough. 09/28/16   Ozella Almond Ward, PA-C  gabapentin (NEURONTIN) 100 MG capsule Take 2 capsules (200 mg total) by mouth 3 (three) times daily. For agitation 05/11/16   Encarnacion Slates, NP  hydrOXYzine (ATARAX/VISTARIL) 25 MG tablet Take 1 tablet (25 mg total) by  mouth every 6 (six) hours as needed for anxiety. 05/11/16   Encarnacion Slates, NP  nicotine (NICODERM CQ - DOSED IN MG/24 HOURS) 14 mg/24hr patch Place 1 patch (14 mg total) onto the skin daily as needed (going to try without first). For smoking cessation 05/11/16   Encarnacion Slates, NP  nystatin cream (MYCOSTATIN) Apply topically 3 (three) times daily as needed (for skin irritation). 05/11/16   Encarnacion Slates, NP  sertraline (ZOLOFT) 50 MG tablet Take 1 tablet (50 mg total) by mouth daily. For depression 05/11/16   Encarnacion Slates, NP  zolpidem (AMBIEN) 5 MG tablet Take 1 tablet (5 mg total) by mouth at bedtime as needed for sleep. 05/11/16   Encarnacion Slates, NP    Family History Family History  Problem Relation Age of Onset  . Heart failure Mother   . Kidney failure Brother   . Diabetes Other   . Hypertension Other     Social History Social History  Substance Use Topics  . Smoking status: Current Every Day Smoker    Types: Cigarettes  . Smokeless tobacco: Never Used  . Alcohol use No     Allergies   Patient has no known allergies.   Review of Systems Review of Systems  Constitutional: Negative for fever.  HENT: Positive for congestion.   Respiratory: Positive for cough. Negative for shortness of breath.   Cardiovascular: Negative for chest pain.  Musculoskeletal: Negative for neck pain.     Physical  Exam Updated Vital Signs BP 134/92   Pulse 86   Temp 97.7 F (36.5 C) (Oral)   Resp 22   Ht 6' (1.829 m)   Wt 130.6 kg   LMP 09/26/2016 (Exact Date)   SpO2 100%   BMI 39.06 kg/m   Physical Exam  Constitutional: She is oriented to person, place, and time. She appears well-developed and well-nourished. No distress.  HENT:  Head: Normocephalic and atraumatic.  OP with erythema, no exudates or tonsillar hypertrophy. + nasal congestion with mucosal edema. No focal areas of sinus tenderness.  Neck: Normal range of motion. Neck supple.  No meningeal signs.   Cardiovascular: Normal  rate, regular rhythm and normal heart sounds.   Pulmonary/Chest: Effort normal.  Lungs are clear to auscultation bilaterally - no w/r/r  Abdominal: Soft. She exhibits no distension. There is no tenderness.  Musculoskeletal: Normal range of motion.  Neurological: She is alert and oriented to person, place, and time.  Skin: Skin is warm and dry. She is not diaphoretic.  Nursing note and vitals reviewed.    ED Treatments / Results  Labs (all labs ordered are listed, but only abnormal results are displayed) Labs Reviewed - No data to display  EKG  EKG Interpretation None       Radiology Dg Chest 2 View  Result Date: 09/28/2016 CLINICAL DATA:  Cough, congestion and sore throat EXAM: CHEST  2 VIEW COMPARISON:  Chest radiograph 03/04/2016 FINDINGS: Cardiomediastinal contours are normal. No pneumothorax or pleural effusion. No focal airspace consolidation or pulmonary edema. IMPRESSION: Clear lungs. Electronically Signed   By: Ulyses Jarred M.D.   On: 09/28/2016 05:16    Procedures Procedures (including critical care time)  Medications Ordered in ED Medications  benzonatate (TESSALON) capsule 100 mg (100 mg Oral Given 09/28/16 0538)     Initial Impression / Assessment and Plan / ED Course  I have reviewed the triage vital signs and the nursing notes.  Pertinent labs & imaging results that were available during my care of the patient were reviewed by me and considered in my medical decision making (see chart for details).  Clinical Course    Ashley Stevens is afebrile, non-toxic appearing with a clear lung exam. Mild rhinorrhea and OP with erythema, no exudates. Likely viral URI. Patient is agreeable to symptomatic treatment with PCP follow up. Reasons to return to ER discussed and all questions answered. .   Blood pressure 134/92, pulse 86, temperature 97.7 F (36.5 C), temperature source Oral, resp. rate 22, height 6' (1.829 m), weight 130.6 kg, last menstrual period  09/26/2016, SpO2 100 %.   Final Clinical Impressions(s) / ED Diagnoses   Final diagnoses:  Cough  Upper respiratory tract infection, unspecified type    New Prescriptions New Prescriptions   BENZONATATE (TESSALON) 100 MG CAPSULE    Take 1 capsule (100 mg total) by mouth 3 (three) times daily as needed for cough.     [facility] Ward, PA-C 09/28/16 [id number]    Sherwood Gambler, MD 09/28/16 [phone]

## 2016-09-28 NOTE — ED Triage Notes (Signed)
Pt states she has not felt well since the 23rd  Pt states sh has congestion, cough, headache and diarrhea  Pt states her chest is sore from coughing so much

## 2016-09-28 NOTE — ED Notes (Signed)
Bed: WA03 Expected date:  Expected time:  Means of arrival:  Comments: 

## 2016-09-28 NOTE — Discharge Instructions (Signed)
1. Medications: flonase and mucinex for nasal congestion, tessalon for cough, continue usual home medications 2. Treatment: rest, drink plenty of fluids, take tylenol or ibuprofen for fever control 3. Follow Up: Please follow up with your primary doctor for discussion of your diagnoses and further evaluation after today's visit if symptoms persist longer than 10-14 days; Return to the ER for high fevers, difficulty breathing or other concerning symptoms

## 2016-09-28 NOTE — ED Notes (Signed)
Pt is c/o coughing, sore throat  and congestion x 5 days.

## 2016-10-18 ENCOUNTER — Emergency Department (HOSPITAL_COMMUNITY)
Admission: EM | Admit: 2016-10-18 | Discharge: 2016-10-18 | Disposition: A | Discharge status: Home / Self Care | Payer: Medicaid Other | Source: Home / Self Care

## 2016-10-18 ENCOUNTER — Encounter (HOSPITAL_COMMUNITY): Payer: Self-pay | Admitting: Emergency Medicine

## 2016-10-18 ENCOUNTER — Emergency Department (HOSPITAL_COMMUNITY): Payer: Medicaid Other

## 2016-10-18 ENCOUNTER — Emergency Department (HOSPITAL_COMMUNITY)
Admission: EM | Admit: 2016-10-18 | Discharge: 2016-10-18 | Disposition: A | Discharge status: Home / Self Care | Payer: Medicaid Other | Attending: Emergency Medicine | Admitting: Emergency Medicine

## 2016-10-18 DIAGNOSIS — Y929 Unspecified place or not applicable: Secondary | ICD-10-CM

## 2016-10-18 DIAGNOSIS — Z79899 Other long term (current) drug therapy: Secondary | ICD-10-CM | POA: Insufficient documentation

## 2016-10-18 DIAGNOSIS — Y999 Unspecified external cause status: Secondary | ICD-10-CM | POA: Insufficient documentation

## 2016-10-18 DIAGNOSIS — S61011A Laceration without foreign body of right thumb without damage to nail, initial encounter: Secondary | ICD-10-CM

## 2016-10-18 DIAGNOSIS — Z5321 Procedure and treatment not carried out due to patient leaving prior to being seen by health care provider: Secondary | ICD-10-CM

## 2016-10-18 DIAGNOSIS — F17210 Nicotine dependence, cigarettes, uncomplicated: Secondary | ICD-10-CM | POA: Insufficient documentation

## 2016-10-18 DIAGNOSIS — Y939 Activity, unspecified: Secondary | ICD-10-CM | POA: Insufficient documentation

## 2016-10-18 DIAGNOSIS — W208XXA Other cause of strike by thrown, projected or falling object, initial encounter: Secondary | ICD-10-CM | POA: Insufficient documentation

## 2016-10-18 MED ORDER — LIDOCAINE HCL (PF) 1 % IJ SOLN
5.0000 mL | Freq: Once | INTRAMUSCULAR | Status: AC
  Administered 2016-10-18: 5 mL
  Filled 2016-10-18: qty 30

## 2016-10-18 MED ORDER — GUAIFENESIN-DM 100-10 MG/5ML PO SYRP: 5.0000 mL | ORAL_SOLUTION | ORAL | Status: DC | PRN

## 2016-10-18 MED ORDER — IBUPROFEN 200 MG PO TABS
600.0000 mg | ORAL_TABLET | Freq: Once | ORAL | Status: AC
  Administered 2016-10-18: 600 mg via ORAL
  Filled 2016-10-18: qty 3

## 2016-10-18 MED ORDER — TETANUS-DIPHTH-ACELL PERTUSSIS 5-2.5-18.5 LF-MCG/0.5 IM SUSP
0.5000 mL | Freq: Once | INTRAMUSCULAR | Status: AC
  Administered 2016-10-18: 0.5 mL via INTRAMUSCULAR
  Filled 2016-10-18: qty 0.5

## 2016-10-18 MED ORDER — LIDOCAINE HCL (PF) 2 % IJ SOLN: 10.0000 mL | Freq: Once | INTRAMUSCULAR | Status: DC

## 2016-10-18 MED ORDER — ACETAMINOPHEN 325 MG PO TABS: 650.0000 mg | ORAL_TABLET | Freq: Once | ORAL | Status: DC

## 2016-10-18 NOTE — Discharge Instructions (Signed)
Read the information below.  You may return to the Emergency Department at any time for worsening condition or any new symptoms that concern you.  If you develop redness, swelling, pus draining from the wound, or fevers greater than 100.4, return to the ER immediately for a recheck.

## 2016-10-18 NOTE — ED Triage Notes (Signed)
Pt tried to catch a falling vase and it shattered in her right hand; approximate 0.75 inch laceration on internal part of right thumb; bleeding controlled; pt unsure of when last tetanus shot was

## 2016-10-18 NOTE — ED Notes (Signed)
Pt A&OX4, ambulatory at d/c with steady gait, NAD and stated she had all of her belongings with her at discharge.

## 2016-10-18 NOTE — ED Provider Notes (Signed)
[facility] DEPT Provider Note   CSN: [id number] Arrival date & time: 10/18/16  [id number]     History   Chief Complaint Chief Complaint  Patient presents with  . Extremity Laceration    HPI Ashley Stevens is a 41 y.o. female.  HPI   Right handed patient presents with right thumb laceration that occurred around 2am, just before her last arrival to the ED.  States she was attempting to catch a thin vase that was falling and it broke in her hand.  Pt was triaged initially just after the accident but needed to take her friend home, came directly back to ED - her tetanus was updated at the time.  The thumb is wrapped, states it is feeling very stiff but initially she was able to move it normally and sensation was normal.  Denies other injury.     History reviewed. No pertinent past medical history.  Patient Active Problem List   Diagnosis Date Noted  . Severe episode of recurrent major depressive disorder, without psychotic features ([place])   . Major depressive disorder without psychotic features 05/07/2016  . Cocaine use disorder, moderate, dependence ([place]) 05/07/2016  . Cannabis use disorder, moderate, dependence ([place]) 05/07/2016  . MDD (major depressive disorder) 05/07/2016    Past Surgical History:  Procedure Laterality Date  . c-section      OB History    No data available       Home Medications    Prior to Admission medications   Medication Sig Start Date End Date Taking? Authorizing Provider  atomoxetine (STRATTERA) 25 MG capsule Take 1 capsule (25 mg total) by mouth daily. For ADHD Patient not taking: Reported on 10/18/2016 05/11/16   Encarnacion Slates, NP  benzonatate (TESSALON) 100 MG capsule Take 1 capsule (100 mg total) by mouth 3 (three) times daily as needed for cough. Patient not taking: Reported on 10/18/2016 09/28/16   [facility] Ward, PA-C  gabapentin (NEURONTIN) 100 MG capsule Take 2 capsules (200 mg total) by mouth 3 (three) times daily. For  agitation Patient not taking: Reported on 10/18/2016 05/11/16   Encarnacion Slates, NP  hydrOXYzine (ATARAX/VISTARIL) 25 MG tablet Take 1 tablet (25 mg total) by mouth every 6 (six) hours as needed for anxiety. Patient not taking: Reported on 10/18/2016 05/11/16   Encarnacion Slates, NP  nicotine (NICODERM CQ - DOSED IN MG/24 HOURS) 14 mg/24hr patch Place 1 patch (14 mg total) onto the skin daily as needed (going to try without first). For smoking cessation Patient not taking: Reported on 10/18/2016 05/11/16   Encarnacion Slates, NP  nystatin cream (MYCOSTATIN) Apply topically 3 (three) times daily as needed (for skin irritation). Patient not taking: Reported on 10/18/2016 05/11/16   Encarnacion Slates, NP  sertraline (ZOLOFT) 50 MG tablet Take 1 tablet (50 mg total) by mouth daily. For depression Patient not taking: Reported on 10/18/2016 05/11/16   Encarnacion Slates, NP  zolpidem (AMBIEN) 5 MG tablet Take 1 tablet (5 mg total) by mouth at bedtime as needed for sleep. Patient not taking: Reported on 10/18/2016 05/11/16   Encarnacion Slates, NP    Family History Family History  Problem Relation Age of Onset  . Heart failure Mother   . Kidney failure Brother   . Diabetes Other   . Hypertension Other     Social History Social History  Substance Use Topics  . Smoking status: Current Every Day Smoker    Types: Cigarettes  . Smokeless tobacco:  Never Used  . Alcohol use No     Allergies   Patient has no known allergies.   Review of Systems Review of Systems  Constitutional: Negative for chills and fever.  Musculoskeletal: Negative for arthralgias, joint swelling and myalgias.  Skin: Positive for wound.  Allergic/Immunologic: Negative for immunocompromised state.  Neurological: Negative for weakness and numbness.  Hematological: Does not bruise/bleed easily.  Psychiatric/Behavioral: Negative for self-injury.     Physical Exam Updated Vital Signs BP 127/81 (BP Location: Left Arm)   Pulse 82   Temp 98 F (36.7  C) (Oral)   Resp 15   LMP 10/08/2016 (Approximate)   SpO2 99%   Physical Exam  Constitutional: She appears well-developed and well-nourished. No distress.  HENT:  Head: Normocephalic and atraumatic.  Neck: Neck supple.  Pulmonary/Chest: Effort normal.  Musculoskeletal:  Ulnar aspect of palmar right thumb with laceration, approximately 1.5 cm.  Hemostatic.  Full AROM of digit, distal sensation intact, capillary refill <3 seconds.    Neurological: She is alert.  Skin: She is not diaphoretic.  Nursing note and vitals reviewed.    ED Treatments / Results  Labs (all labs ordered are listed, but only abnormal results are displayed) Labs Reviewed - No data to display  EKG  EKG Interpretation None       Radiology Dg Finger Thumb Right  Result Date: 10/18/2016 CLINICAL DATA:  Laceration from broken glass EXAM: RIGHT THUMB 2+V COMPARISON:  Right hand October 13, 2012 FINDINGS: Frontal, oblique, and lateral views were obtained. There is an overlying bandage. No other radiopaque foreign body evident. There is no fracture or dislocation. Joint spaces appear normal. No erosive change. IMPRESSION: No fracture or dislocation. No apparent arthropathy. No radiopaque foreign body appreciable beyond overlying bandage. Electronically Signed   By: Lowella Grip III M.D.   On: 10/18/2016 07:46    Procedures Procedures (including critical care time)  LACERATION REPAIR Performed by: Clayton Bibles Authorized by: Clayton Bibles Consent: Verbal consent obtained. Risks and benefits: risks, benefits and alternatives were discussed Consent given by: patient Patient identity confirmed: provided demographic data Prepped and Draped in normal sterile fashion Wound explored  Laceration Location: right thumb  Laceration Length: 1.5cm  No Foreign Bodies seen or palpated  Anesthesia: local infiltration  Local anesthetic: lidocaine 1% no epinephrine  Anesthetic total: 3 ml  Irrigation method:  syringe Amount of cleaning: standard  Skin closure: 5-0 vicryl  Number of sutures: 5  Technique: simple interrupted   Patient tolerance: Patient tolerated the procedure well with no immediate complications.   Medications Ordered in ED Medications  ibuprofen (ADVIL,MOTRIN) tablet 600 mg (600 mg Oral Given 10/18/16 0727)  lidocaine (PF) (XYLOCAINE) 1 % injection 5 mL (5 mLs Infiltration Given 10/18/16 0727)     Initial Impression / Assessment and Plan / ED Course  I have reviewed the triage vital signs and the nursing notes.  Pertinent labs & imaging results that were available during my care of the patient were reviewed by me and considered in my medical decision making (see chart for details).  Clinical Course     Afebrile, nontoxic patient with injury to her right thumb while attempting to catch a vase.  Neurovascularly intact.   Xray negative for FB.  Repaired in department.  Absorbable sutures placed.  D/C home.  Discussed result, findings, treatment, and follow up  with patient.  Pt given return precautions.  Pt verbalizes understanding and agrees with plan.      Final Clinical Impressions(s) /  ED Diagnoses   Final diagnoses:  Thumb laceration, right, initial encounter    New Prescriptions New Prescriptions   No medications on file     Clayton Bibles, PA-C 10/18/16 [place], MD 10/19/16 2218

## 2016-10-18 NOTE — ED Notes (Signed)
Pt called for VS recheck; no answer x2

## 2016-10-18 NOTE — ED Notes (Signed)
See PAs notes for secondary assessment.  

## 2016-10-18 NOTE — ED Triage Notes (Signed)
Pt checked back in after taking friend home;  Pt tried to catch a falling vase and it shattered in her right hand; approximate 0.75 inch laceration on internal part of right thumb; bleeding controlled; tetanus shot given earlier tonight

## 2016-11-02 ENCOUNTER — Encounter (HOSPITAL_COMMUNITY): Payer: Self-pay | Admitting: *Deleted

## 2016-11-02 ENCOUNTER — Emergency Department (HOSPITAL_COMMUNITY)
Admission: EM | Admit: 2016-11-02 | Discharge: 2016-11-03 | Disposition: A | Discharge status: Home / Self Care | Payer: Medicaid Other | Attending: Emergency Medicine | Admitting: Emergency Medicine

## 2016-11-02 DIAGNOSIS — R45851 Suicidal ideations: Secondary | ICD-10-CM | POA: Diagnosis not present

## 2016-11-02 DIAGNOSIS — Z79899 Other long term (current) drug therapy: Secondary | ICD-10-CM | POA: Diagnosis not present

## 2016-11-02 DIAGNOSIS — F329 Major depressive disorder, single episode, unspecified: Secondary | ICD-10-CM | POA: Diagnosis not present

## 2016-11-02 DIAGNOSIS — F1420 Cocaine dependence, uncomplicated: Secondary | ICD-10-CM

## 2016-11-02 DIAGNOSIS — F1414 Cocaine abuse with cocaine-induced mood disorder: Secondary | ICD-10-CM | POA: Diagnosis not present

## 2016-11-02 DIAGNOSIS — F32A Depression, unspecified: Secondary | ICD-10-CM

## 2016-11-02 DIAGNOSIS — Z5900 Homelessness unspecified: Secondary | ICD-10-CM

## 2016-11-02 DIAGNOSIS — F17210 Nicotine dependence, cigarettes, uncomplicated: Secondary | ICD-10-CM | POA: Insufficient documentation

## 2016-11-02 DIAGNOSIS — F1220 Cannabis dependence, uncomplicated: Secondary | ICD-10-CM | POA: Diagnosis present

## 2016-11-02 HISTORY — DX: Major depressive disorder, single episode, unspecified: F32.9

## 2016-11-02 HISTORY — DX: Depression, unspecified: F32.A

## 2016-11-02 HISTORY — DX: Anxiety disorder, unspecified: F41.9

## 2016-11-02 LAB — POC URINE PREG, ED: Preg Test, Ur: NEGATIVE
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
## 2016-11-02 NOTE — ED Notes (Signed)
Pt requesting to shower.  Shower items provided.

## 2016-11-03 DIAGNOSIS — F1414 Cocaine abuse with cocaine-induced mood disorder: Secondary | ICD-10-CM | POA: Diagnosis present

## 2016-11-03 LAB — COMPREHENSIVE METABOLIC PANEL
ALT: 7 U/L — AB (ref 14–54)
ANION GAP: 7 (ref 5–15)
AST: 19 U/L (ref 15–41)
Albumin: 3.5 g/dL (ref 3.5–5.0)
Alkaline Phosphatase: 77 U/L (ref 38–126)
BUN: 9 mg/dL (ref 6–20)
CALCIUM: 9.1 mg/dL (ref 8.9–10.3)
CHLORIDE: 102 mmol/L (ref 101–111)
CO2: 29 mmol/L (ref 22–32)
CREATININE: 1.02 mg/dL — AB (ref 0.44–1.00)
Glucose, Bld: 129 mg/dL — ABNORMAL HIGH (ref 65–99)
Potassium: 3.8 mmol/L (ref 3.5–5.1)
SODIUM: 138 mmol/L (ref 135–145)
Total Bilirubin: 0.5 mg/dL (ref 0.3–1.2)
Total Protein: 6.6 g/dL (ref 6.5–8.1)

## 2016-11-03 LAB — CBC WITH DIFFERENTIAL/PLATELET
BASOS ABS: 0 10*3/uL (ref 0.0–0.1)
Basophils Relative: 0 %
EOS ABS: 0.1 10*3/uL (ref 0.0–0.7)
EOS PCT: 1 %
HCT: 40.4 % (ref 36.0–46.0)
HEMOGLOBIN: 13.2 g/dL (ref 12.0–15.0)
LYMPHS PCT: 23 %
Lymphs Abs: 2.8 10*3/uL (ref 0.7–4.0)
MCH: 27.5 pg (ref 26.0–34.0)
MCHC: 32.7 g/dL (ref 30.0–36.0)
MCV: 84.2 fL (ref 78.0–100.0)
Monocytes Absolute: 1 10*3/uL (ref 0.1–1.0)
Monocytes Relative: 8 %
NEUTROS PCT: 68 %
Neutro Abs: 8 10*3/uL — ABNORMAL HIGH (ref 1.7–7.7)
PLATELETS: 366 10*3/uL (ref 150–400)
RBC: 4.8 MIL/uL (ref 3.87–5.11)
RDW: 14.3 % (ref 11.5–15.5)
WBC: 11.9 10*3/uL — AB (ref 4.0–10.5)

## 2016-11-03 LAB — RAPID URINE DRUG SCREEN, HOSP PERFORMED
AMPHETAMINES: NOT DETECTED
BENZODIAZEPINES: NOT DETECTED
Barbiturates: NOT DETECTED
Cocaine: POSITIVE — AB
OPIATES: NOT DETECTED
TETRAHYDROCANNABINOL: POSITIVE — AB

## 2016-11-03 LAB — ETHANOL: Alcohol, Ethyl (B): <5 mg/dL (ref <5)

## 2016-11-03 MED ORDER — ZOLPIDEM TARTRATE 5 MG PO TABS: 5.0000 mg | ORAL_TABLET | Freq: Every evening | ORAL | Status: DC | PRN

## 2016-11-03 MED ORDER — ONDANSETRON HCL 4 MG PO TABS: 4.0000 mg | ORAL_TABLET | Freq: Three times a day (TID) | ORAL | Status: DC | PRN

## 2016-11-03 MED ORDER — LORAZEPAM 1 MG PO TABS: 1.0000 mg | ORAL_TABLET | Freq: Three times a day (TID) | ORAL | Status: DC | PRN

## 2016-11-03 MED ORDER — HYDROXYZINE HCL 25 MG PO TABS
25.0000 mg | ORAL_TABLET | Freq: Two times a day (BID) | ORAL | Status: DC
  Administered 2016-11-03: 25 mg via ORAL
  Filled 2016-11-03: qty 1

## 2016-11-03 MED ORDER — SERTRALINE HCL 50 MG PO TABS: 50.0000 mg | ORAL_TABLET | Freq: Every day | ORAL | 0 refills | Status: DC

## 2016-11-03 MED ORDER — ACETAMINOPHEN 325 MG PO TABS: 650.0000 mg | ORAL_TABLET | ORAL | Status: DC | PRN

## 2016-11-03 MED ORDER — SERTRALINE HCL 50 MG PO TABS
50.0000 mg | ORAL_TABLET | Freq: Every day | ORAL | Status: DC
  Administered 2016-11-03: 50 mg via ORAL
  Filled 2016-11-03: qty 1

## 2016-11-03 NOTE — ED Notes (Signed)
When writer informed pt that she was being discharged, pt became tearful stating that she is "worried about being discharged" going on to say that she is concerned that she doesn't 'want to go back out there.'
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
## 2016-11-03 NOTE — BH Assessment (Signed)
[place] Assessment Progress Note  Per Corena Pilgrim, MD, this pt does not require psychiatric hospitalization at this time.  Pt is to be discharged from [facility] with recommendation to follow up with [facility].  She is also to be provided with area resources for the homeless.  This has been included in pt's discharge instructions.  Pt's nurse, Diane, has been notified.  Jalene Mullet, [place] Triage Specialist [phone]

## 2016-11-03 NOTE — ED Notes (Signed)
Pt discharged home. Discharged instructions read to pt who verbalized understanding. All belongings returned to pt who signed for same. Security escorted pt to the ED exit.
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
## 2016-11-03 NOTE — BHH Counselor (Addendum)
Pt can not be roused. Margaretha Sheffield, RN attempted to rouse pt however pt continued to sleep. Resubmit TTS consult when the pt is alert. Discussed with Margaretha Sheffield, RN.    Edd Fabian, MS, [facility], [facility] Triage Specialist [phone]

## 2016-11-03 NOTE — ED Notes (Signed)
Attempted to wake pt, pt would mumble, and go back to sleep.

## 2016-11-03 NOTE — ED Notes (Signed)
SBAR Report received from previous nurse. Pt received calm and visible on unit. Pt denies current SI/ HI, A/V H, depression, anxiety, or pain at this time, and appears otherwise stable and free of distress. Pt asked for a [place] sandwich or a cup of ravioli, pt refused once informed we only have ham and no ravioli cups. Pt reminded of camera surveillance, q 15 min rounds, and rules of the milieu. Will continue to assess.

## 2016-11-03 NOTE — ED Notes (Signed)
TTS assessment in progress. 

## 2016-11-03 NOTE — Discharge Instructions (Signed)
For your ongoing mental health needs, you are advised to follow up with [facility].  New and returning patients are seen at their walk-in clinic.  Walk-in hours are Monday - Friday from 8:00 am - 3:00 pm.  Walk-in patients are seen on a first come, first served basis.  Try to arrive as early as possible for he best chance of being seen the same day: ° °     [facility] °     [address] °     [place], [place] [address] °     [phone] ° °For your shelter needs, contact the following service providers: ° °     [facility] (operated by [facility]) °     [address] °     [place], [place] [address] °     [phone] ° °     [facility] °     [address] °     [place], [place] [address] °     [phone] ° °For day shelter and other supportive services for the homeless, contact the [facility] ([facility]): ° °     [facility] °     [address] °     [place], [place] [address] °     [phone] ° °For transitional housing, contact one of the following agencies.  They provide longer term housing than a shelter, but there is an application process: ° °     [facility] °     [address] °     [place], [place] [address] °     [phone] ° °     [facility] °     [facility] °     [address] °     [place], [place] [address] °     [phone] °

## 2016-11-03 NOTE — BHH Suicide Risk Assessment (Signed)
Suicide Risk Assessment  Discharge Assessment   [facility] Discharge Suicide Risk Assessment   Principal Problem: Cocaine abuse with cocaine-induced mood disorder [facility]) Discharge Diagnoses:  Patient Active Problem List   Diagnosis Date Noted  . Cocaine abuse with cocaine-induced mood disorder [facility]) [F14.14] 11/03/2016    Priority: High  . Cocaine use disorder, moderate, dependence ([place]) [F14.20] 05/07/2016    Priority: High  . Cannabis use disorder, moderate, dependence ([place]) [F12.20] 05/07/2016    Total Time spent with patient: 45 minutes  Musculoskeletal: Strength & Muscle Tone: within normal limits Gait & Station: normal Patient leans: N/A  Psychiatric Specialty Exam:   Blood pressure 129/78, pulse 81, temperature 98.7 F (37.1 C), temperature source Oral, resp. rate 18, height 6' (1.829 m), weight 117.9 kg (260 lb), last menstrual period 10/08/2016, SpO2 100 %.Body mass index is 35.26 kg/m.  General Appearance: Disheveled  Eye Contact::  Good  Speech:  Normal Rate409  Volume:  Normal  Mood:  Irritable  Affect:  Congruent  Thought Process:  Coherent and Descriptions of Associations: Intact  Orientation:  Full (Time, Place, and Person)  Thought Content:  WDL and Logical  Suicidal Thoughts:  No  Homicidal Thoughts:  No  Memory:  Immediate;   Good Recent;   Good Remote;   Good  Judgement:  Fair  Insight:  Fair  Psychomotor Activity:  Normal  Concentration:  Good  Recall:  Good  Fund of Knowledge:Fair  Language: Good  Akathisia:  No  Handed:  Right  AIMS (if indicated):     Assets:  Leisure Time Physical Health Resilience  Sleep:     Cognition: WNL  ADL's:  Intact   Mental Status Per Nursing Assessment::   On Admission:     Demographic Factors:  NA  Loss Factors: Legal issues  Historical Factors: NA  Risk Reduction Factors:   Sense of responsibility to family, Positive social support and Positive therapeutic relationship  Continued Clinical Symptoms:   Irritable   Cognitive Features That Contribute To Risk:  None    Suicide Risk:  Minimal: No identifiable suicidal ideation.  Patients presenting with no risk factors but with morbid ruminations; may be classified as minimal risk based on the severity of the depressive symptoms    Plan Of Care/Follow-up recommendations:  Activity:  as tolerated Diet:  heart healthy diet  Sosaia Pittinger, NP 11/03/2016, 10:09 AM

## 2016-12-26 ENCOUNTER — Emergency Department (HOSPITAL_COMMUNITY)
Admission: EM | Admit: 2016-12-26 | Discharge: 2016-12-26 | Disposition: A | Discharge status: Home / Self Care | Payer: Medicaid Other | Attending: Emergency Medicine | Admitting: Emergency Medicine

## 2016-12-26 ENCOUNTER — Encounter (HOSPITAL_COMMUNITY): Payer: Self-pay | Admitting: Emergency Medicine

## 2016-12-26 DIAGNOSIS — F17210 Nicotine dependence, cigarettes, uncomplicated: Secondary | ICD-10-CM | POA: Insufficient documentation

## 2016-12-26 DIAGNOSIS — K130 Diseases of lips: Secondary | ICD-10-CM | POA: Diagnosis not present

## 2016-12-26 DIAGNOSIS — R220 Localized swelling, mass and lump, head: Secondary | ICD-10-CM | POA: Diagnosis present

## 2016-12-26 MED ORDER — BENZOCAINE 20 % MT AERO
INHALATION_SPRAY | Freq: Once | OROMUCOSAL | Status: AC
  Administered 2016-12-26: 1 via OROMUCOSAL
  Filled 2016-12-26: qty 57

## 2016-12-26 MED ORDER — BUPIVACAINE-EPINEPHRINE (PF) 0.5% -1:200000 IJ SOLN
1.8000 mL | Freq: Once | INTRAMUSCULAR | Status: AC
  Administered 2016-12-26: 1.8 mL
  Filled 2016-12-26: qty 1.8

## 2016-12-26 MED ORDER — AMOXICILLIN-POT CLAVULANATE 875-125 MG PO TABS
1.0000 | ORAL_TABLET | Freq: Once | ORAL | Status: AC
  Administered 2016-12-26: 1 via ORAL
  Filled 2016-12-26: qty 1

## 2016-12-26 MED ORDER — OXYCODONE-ACETAMINOPHEN 5-325 MG PO TABS
1.0000 | ORAL_TABLET | Freq: Once | ORAL | Status: AC
  Administered 2016-12-26: 1 via ORAL
  Filled 2016-12-26: qty 1

## 2016-12-26 MED ORDER — PENICILLIN V POTASSIUM 500 MG PO TABS: 500.0000 mg | ORAL_TABLET | Freq: Four times a day (QID) | ORAL | 0 refills | Status: AC
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
## 2016-12-26 NOTE — Discharge Instructions (Signed)
You may take penicillin as prescribed for infection. We recommend that you swish with warm water after meals to prevent food particles from getting stuck in your wound. Apply warm compresses to promote drainage. Follow-up for wound recheck to ensure resolution of symptoms. You may return sooner for new or concerning symptoms.
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
## 2017-03-03 ENCOUNTER — Encounter (HOSPITAL_COMMUNITY): Payer: Self-pay | Admitting: Oncology

## 2017-03-03 DIAGNOSIS — F17210 Nicotine dependence, cigarettes, uncomplicated: Secondary | ICD-10-CM | POA: Diagnosis not present

## 2017-03-03 DIAGNOSIS — Z79899 Other long term (current) drug therapy: Secondary | ICD-10-CM | POA: Diagnosis not present

## 2017-03-03 DIAGNOSIS — R600 Localized edema: Secondary | ICD-10-CM | POA: Diagnosis not present

## 2017-03-03 NOTE — ED Triage Notes (Signed)
Pt c/o intermittent b/l LE edema for, "Months."  Per pt her mom died from complications of CHF at 3.  Pt has non pitting edema.  States that it hurts to ambulate.  Pt rates pain 8/10, throbbing in nature.

## 2017-03-04 ENCOUNTER — Emergency Department (HOSPITAL_COMMUNITY)
Admission: EM | Admit: 2017-03-04 | Discharge: 2017-03-04 | Disposition: A | Discharge status: Home / Self Care | Payer: Medicaid Other | Attending: Emergency Medicine | Admitting: Emergency Medicine

## 2017-03-04 ENCOUNTER — Emergency Department (HOSPITAL_COMMUNITY): Payer: Medicaid Other

## 2017-03-04 DIAGNOSIS — R609 Edema, unspecified: Secondary | ICD-10-CM

## 2017-03-04 LAB — CBC WITH DIFFERENTIAL/PLATELET
BASOS ABS: 0.1 10*3/uL (ref 0.0–0.1)
Basophils Relative: 0 %
EOS ABS: 0.2 10*3/uL (ref 0.0–0.7)
EOS PCT: 2 %
HCT: 36.3 % (ref 36.0–46.0)
HEMOGLOBIN: 11.9 g/dL — AB (ref 12.0–15.0)
Lymphocytes Relative: 33 %
Lymphs Abs: 3.9 10*3/uL (ref 0.7–4.0)
MCH: 27.2 pg (ref 26.0–34.0)
MCHC: 32.8 g/dL (ref 30.0–36.0)
MCV: 82.9 fL (ref 78.0–100.0)
MONOS PCT: 8 %
Monocytes Absolute: 1 10*3/uL (ref 0.1–1.0)
NEUTROS ABS: 6.7 10*3/uL (ref 1.7–7.7)
Neutrophils Relative %: 57 %
PLATELETS: 285 10*3/uL (ref 150–400)
RBC: 4.38 MIL/uL (ref 3.87–5.11)
RDW: 14.3 % (ref 11.5–15.5)
WBC: 11.8 10*3/uL — ABNORMAL HIGH (ref 4.0–10.5)

## 2017-03-04 LAB — BASIC METABOLIC PANEL
ANION GAP: 7 (ref 5–15)
BUN: 13 mg/dL (ref 6–20)
CALCIUM: 8.6 mg/dL — AB (ref 8.9–10.3)
CO2: 26 mmol/L (ref 22–32)
Chloride: 106 mmol/L (ref 101–111)
Creatinine, Ser: 1.02 mg/dL — ABNORMAL HIGH (ref 0.44–1.00)
GFR calc Af Amer: >60 mL/min (ref >60)
GFR calc non Af Amer: >60 mL/min (ref >60)
GLUCOSE: 93 mg/dL (ref 65–99)
Potassium: 3.7 mmol/L (ref 3.5–5.1)
Sodium: 139 mmol/L (ref 135–145)

## 2017-03-04 NOTE — Discharge Instructions (Signed)
The testing today did not show any problems with heart or lungs.  The swelling will likely improve, with weight loss.  Also, try to avoid added salt, since salt tends to make you retaining fluid.  Follow-up with your primary care doctor for further evaluation and treatment.

## 2017-03-04 NOTE — ED Notes (Signed)
Pt is alert and oriented x 4 and is verbally responsive, Pt states that she has been having swelling in her legs for the past few weeks and sam a MD but was given pain medications for management,. PT has 3+ edema non pitting noted to bilat. Legs and feet. Pt states that symptoms became worse and made it difficult and painful to walk.

## 2017-03-04 NOTE — ED Provider Notes (Signed)
[facility] DEPT Provider Note   CSN: [id number] Arrival date & time: 03/03/17  2217  By signing my name below, I, Marcello Moores, attest that this documentation has been prepared under the direction and in the presence of Daleen Bo, MD. Electronically Signed: Marcello Moores, ED Scribe. 03/04/17. 3:46 AM.  History   Chief Complaint Chief Complaint  Patient presents with  . Leg Swelling   The history is provided by the patient. No language interpreter was used.    HPI Comments: Ashley Stevens is a 41 y.o. female who presents to the Emergency Department complaining of intermittent bilateral leg swelling in her legs and feet that she first noticed in January 2018, but began worsening in April of 2018. The pt has associated chest pain, SOB, and a "low-grade fever." She additionally reports that she gained approximately 30lbs.The pt also complains of left hip pain, numbness in her great toes, and pain in her "Achilles tendon. The pt denies taking any fluid-reducing medications. She also attests that her FHx includes HTN, HLD, and DM. The pt denies cough. There are no other known modifying factors.  Past Medical History:  Diagnosis Date  . Anxiety   . Depression     Patient Active Problem List   Diagnosis Date Noted  . Cocaine abuse with cocaine-induced mood disorder ([place]) 11/03/2016  . Cocaine use disorder, moderate, dependence ([place]) 05/07/2016  . Cannabis use disorder, moderate, dependence ([place]) 05/07/2016    Past Surgical History:  Procedure Laterality Date  . c-section      OB History    No data available       Home Medications    Prior to Admission medications   Medication Sig Start Date End Date Taking? Authorizing Provider  sertraline (ZOLOFT) 50 MG tablet Take 1 tablet (50 mg total) by mouth daily. 11/03/16  Yes Patrecia Pour, NP  atomoxetine (STRATTERA) 25 MG capsule Take 1 capsule (25 mg total) by mouth daily. For ADHD Patient not taking: Reported on  10/18/2016 05/11/16   Lindell Spar I, NP  benzonatate (TESSALON) 100 MG capsule Take 1 capsule (100 mg total) by mouth 3 (three) times daily as needed for cough. Patient not taking: Reported on 10/18/2016 09/28/16   Ward, Ozella Almond, PA-C  gabapentin (NEURONTIN) 100 MG capsule Take 2 capsules (200 mg total) by mouth 3 (three) times daily. For agitation Patient not taking: Reported on 10/18/2016 05/11/16   Lindell Spar I, NP  hydrOXYzine (ATARAX/VISTARIL) 25 MG tablet Take 1 tablet (25 mg total) by mouth every 6 (six) hours as needed for anxiety. Patient not taking: Reported on 10/18/2016 05/11/16   Lindell Spar I, NP  nicotine (NICODERM CQ - DOSED IN MG/24 HOURS) 14 mg/24hr patch Place 1 patch (14 mg total) onto the skin daily as needed (going to try without first). For smoking cessation Patient not taking: Reported on 10/18/2016 05/11/16   Lindell Spar I, NP  zolpidem (AMBIEN) 5 MG tablet Take 1 tablet (5 mg total) by mouth at bedtime as needed for sleep. Patient not taking: Reported on 10/18/2016 05/11/16   Encarnacion Slates, NP    Family History Family History  Problem Relation Age of Onset  . Heart failure Mother   . Kidney failure Brother   . Diabetes Other   . Hypertension Other     Social History Social History  Substance Use Topics  . Smoking status: Current Every Day Smoker    Types: Cigarettes  . Smokeless tobacco: Never Used  . Alcohol  use No     Allergies   Patient has no known allergies.   Review of Systems Review of Systems  Constitutional: Positive for fever.  Respiratory: Positive for shortness of breath. Negative for cough.   Cardiovascular: Positive for chest pain and leg swelling.  Musculoskeletal: Positive for myalgias (left hip pain).  Neurological: Positive for numbness.  All other systems reviewed and are negative.    Physical Exam Updated Vital Signs BP 123/83 (BP Location: Left Arm)   Pulse 79   Temp 97.7 F (36.5 C) (Oral)   Resp 14   Ht 6' (1.829  m)   Wt 127 kg (280 lb)   LMP 03/03/2017 (Exact Date)   SpO2 100%   BMI 37.97 kg/m   Physical Exam  Constitutional: She is oriented to person, place, and time. She appears well-developed and well-nourished.  HENT:  Head: Normocephalic and atraumatic.  Eyes: Conjunctivae and EOM are normal. Pupils are equal, round, and reactive to light.  Neck: Normal range of motion and phonation normal. Neck supple.  Cardiovascular: Normal rate and regular rhythm.   Pulmonary/Chest: Effort normal and breath sounds normal. She exhibits no tenderness.  Abdominal: Soft. She exhibits no distension. There is no tenderness. There is no guarding.  Musculoskeletal: Normal range of motion. She exhibits edema.  2+ edema in lower legs  Neurological: She is alert and oriented to person, place, and time. She exhibits normal muscle tone.  Left great toe with decreased light touch sensation  Skin: Skin is warm and dry.  Psychiatric: She has a normal mood and affect. Her behavior is normal. Judgment and thought content normal.  Nursing note and vitals reviewed.    ED Treatments / Results   DIAGNOSTIC STUDIES: Oxygen Saturation is 100% on RA, normal by my interpretation.   COORDINATION OF CARE: 3:18 AM-Discussed next steps with pt. Pt verbalized understanding and is agreeable with the plan.   (all labs ordered are listed, but only abnormal results are displayed) Labs Reviewed  BASIC METABOLIC PANEL - Abnormal; Notable for the following:       Result Value   Creatinine, Ser 1.02 (*)    Calcium 8.6 (*)    All other components within normal limits  CBC WITH DIFFERENTIAL/PLATELET - Abnormal; Notable for the following:    WBC 11.8 (*)    Hemoglobin 11.9 (*)    All other components within normal limits    EKG  EKG Interpretation  Date/Time:  Sunday March 04 2017 03:15:30 EDT Ventricular Rate:  74 PR Interval:    QRS Duration: 85 QT Interval:  419 QTC Calculation: 465 R Axis:   77 Text  Interpretation:  Sinus rhythm since last tracing no significant change Confirmed by Daleen Bo [phone]) on 03/04/2017 4:14:05 AM       Radiology Dg Chest 2 View  Result Date: 03/04/2017 CLINICAL DATA:  Lower extremity edema. EXAM: CHEST  2 VIEW COMPARISON:  None. FINDINGS: The heart size and mediastinal contours are within normal limits. Both lungs are clear. The visualized skeletal structures are unremarkable. IMPRESSION: No active cardiopulmonary disease. Electronically Signed   By: Ulyses Jarred M.D.   On: 03/04/2017 04:32    Procedures Procedures (including critical care time)  Medications Ordered in ED Medications - No data to display   Initial Impression / Assessment and Plan / ED Course  I have reviewed the triage vital signs and the nursing notes.  Pertinent labs & imaging results that were available during my care of the patient  were reviewed by me and considered in my medical decision making (see chart for details).      Patient Vitals for the past 24 hrs:  BP Temp Temp src Pulse Resp SpO2 Height Weight  03/04/17 0426 - - - - - 100 % - -  03/04/17 0310 123/83 - - 79 14 100 % - -  03/03/17 2241 - - - - - - 6' (1.829 m) 127 kg (280 lb)  03/03/17 2240 (!) 143/93 97.7 F (36.5 C) Oral 86 20 100 % - -    4:41 AM Reevaluation with update and discussion. After initial assessment and treatment, an updated evaluation reveals no change in clinical status.  Findings discussed with patient all questions answered. Savanna Dooley L    Final Clinical Impressions(s) / ED Diagnoses   Final diagnoses:  None   Peripheral edema without findings for pulmonary edema, congestive heart failure, serious bacterial infection or metabolic instability.  Nursing Notes Reviewed/ Care Coordinated Applicable Imaging Reviewed Interpretation of Laboratory Data incorporated into ED treatment  The patient appears reasonably screened and/or stabilized for discharge and I doubt any other medical  condition or other [facility] requiring further screening, evaluation, or treatment in the ED at this time prior to discharge.  Plan: Home Medications-over-the-counter as needed; Home Treatments-rest, low-salt diet, elevate legs; return here if the recommended treatment, does not improve the symptoms; Recommended follow up-PCP, checkup in 1 or 2 weeks    New Prescriptions New Prescriptions   No medications on file   I personally performed the services described in this documentation, which was scribed in my presence. The recorded information has been reviewed and is accurate.      Daleen Bo, MD 03/04/17 [phone]

## 2017-03-13 ENCOUNTER — Encounter (HOSPITAL_COMMUNITY): Payer: Self-pay | Admitting: Emergency Medicine

## 2017-03-13 ENCOUNTER — Emergency Department (HOSPITAL_COMMUNITY): Payer: Medicaid Other

## 2017-03-13 ENCOUNTER — Emergency Department (HOSPITAL_COMMUNITY)
Admission: EM | Admit: 2017-03-13 | Discharge: 2017-03-13 | Disposition: A | Discharge status: Home / Self Care | Payer: Medicaid Other | Attending: Emergency Medicine | Admitting: Emergency Medicine

## 2017-03-13 DIAGNOSIS — F17210 Nicotine dependence, cigarettes, uncomplicated: Secondary | ICD-10-CM | POA: Diagnosis not present

## 2017-03-13 DIAGNOSIS — M7662 Achilles tendinitis, left leg: Secondary | ICD-10-CM | POA: Insufficient documentation

## 2017-03-13 DIAGNOSIS — Z79899 Other long term (current) drug therapy: Secondary | ICD-10-CM | POA: Insufficient documentation

## 2017-03-13 DIAGNOSIS — R609 Edema, unspecified: Secondary | ICD-10-CM

## 2017-03-13 DIAGNOSIS — M7989 Other specified soft tissue disorders: Secondary | ICD-10-CM | POA: Diagnosis present

## 2017-03-13 DIAGNOSIS — R600 Localized edema: Secondary | ICD-10-CM | POA: Insufficient documentation

## 2017-03-13 LAB — I-STAT CHEM 8, ED
BUN: 6 mg/dL (ref 6–20)
CALCIUM ION: 1.16 mmol/L (ref 1.15–1.40)
CHLORIDE: 103 mmol/L (ref 101–111)
Creatinine, Ser: 1.1 mg/dL — ABNORMAL HIGH (ref 0.44–1.00)
Glucose, Bld: 92 mg/dL (ref 65–99)
HEMATOCRIT: 38 % (ref 36.0–46.0)
Hemoglobin: 12.9 g/dL (ref 12.0–15.0)
Potassium: 4.1 mmol/L (ref 3.5–5.1)
SODIUM: 140 mmol/L (ref 135–145)
TCO2: 26 mmol/L (ref 0–100)

## 2017-03-13 LAB — I-STAT TROPONIN, ED: TROPONIN I, POC: 0 ng/mL (ref 0.00–0.08)

## 2017-03-13 NOTE — Discharge Instructions (Signed)
As discussed, follow the low sodium eating plan, RICE protocol and achille tendinitis instructions.  Follow up with orthopedics and a primary care provider. Return if symptoms worsen despite following these instructions in the meantime.

## 2017-03-13 NOTE — ED Triage Notes (Signed)
Patient c/o bilateral lower extremity swelling and pain worsening for two months. 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 Ambulatory to triage.

## 2017-03-13 NOTE — ED Provider Notes (Signed)
[facility] DEPT Provider Note   CSN: [id number] Arrival date & time: 03/13/17  1336     History   Chief Complaint Chief Complaint  Patient presents with  . Leg Swelling    HPI Ashley Stevens is a 41 y.o. female presenting with chronic bilateral leg swelling otherwise, left Achilles pain, left great toe numbness. She reports that she typically works through the weekend on her feet all day and notices some swelling by the end of the weekend which usually subsides. She has been reporting no relief this time and doesn't appear to be going down. She denies having followed up with primary care, or tried anything for pain or swelling. She does state that she has intermittently tried stockings. This is interfering with her daily life since she cannot exercise with the pain and swelling. She also reports increased shortness of breath on exertion and intermittent substernal heartburn type chest pain which she has had earlier today. Denies history of malignancy, DVT/PE, prolonged immobilization, recent surgery or trauma, hemoptysis, or unilateral  Calf swelling or pain.  HPI  Past Medical History:  Diagnosis Date  . Anxiety   . Depression     Patient Active Problem List   Diagnosis Date Noted  . Cocaine abuse with cocaine-induced mood disorder ([place]) 11/03/2016  . Cocaine use disorder, moderate, dependence ([place]) 05/07/2016  . Cannabis use disorder, moderate, dependence ([place]) 05/07/2016    Past Surgical History:  Procedure Laterality Date  . c-section      OB History    No data available       Home Medications    Prior to Admission medications   Medication Sig Start Date End Date Taking? Authorizing Provider  ALPRAZolam Duanne Moron) 1 MG tablet Take 1 mg by mouth 3 (three) times daily as needed for anxiety.  03/05/17  Yes [provider]  ibuprofen (ADVIL,MOTRIN) 200 MG tablet Take 400-600 mg by mouth every 6 (six) hours as needed for moderate pain.   Yes [provider]  sertraline (ZOLOFT) 100 MG tablet Take 100 mg by mouth daily. 03/05/17  Yes [provider]  atomoxetine (STRATTERA) 25 MG capsule Take 1 capsule (25 mg total) by mouth daily. For ADHD Patient not taking: Reported on 10/18/2016 05/11/16   Lindell Spar I, NP  benzonatate (TESSALON) 100 MG capsule Take 1 capsule (100 mg total) by mouth 3 (three) times daily as needed for cough. Patient not taking: Reported on 10/18/2016 09/28/16   Ward, Ozella Almond, PA-C  gabapentin (NEURONTIN) 100 MG capsule Take 2 capsules (200 mg total) by mouth 3 (three) times daily. For agitation Patient not taking: Reported on 10/18/2016 05/11/16   Lindell Spar I, NP  hydrOXYzine (ATARAX/VISTARIL) 25 MG tablet Take 1 tablet (25 mg total) by mouth every 6 (six) hours as needed for anxiety. Patient not taking: Reported on 10/18/2016 05/11/16   Lindell Spar I, NP  nicotine (NICODERM CQ - DOSED IN MG/24 HOURS) 14 mg/24hr patch Place 1 patch (14 mg total) onto the skin daily as needed (going to try without first). For smoking cessation Patient not taking: Reported on 10/18/2016 05/11/16   Lindell Spar I, NP  Oxycodone HCl 10 MG TABS Take 10 mg by mouth 3 (three) times daily. 03/05/17   [provider]  sertraline (ZOLOFT) 50 MG tablet Take 1 tablet (50 mg total) by mouth daily. Patient not taking: Reported on 03/13/2017 11/03/16   Patrecia Pour, NP  zolpidem (AMBIEN) 5 MG tablet Take 1 tablet (5  mg total) by mouth at bedtime as needed for sleep. Patient not taking: Reported on 10/18/2016 05/11/16   Encarnacion Slates, NP    Family History Family History  Problem Relation Age of Onset  . Heart failure Mother   . Kidney failure Brother   . Diabetes Other   . Hypertension Other     Social History Social History  Substance Use Topics  . Smoking status: Current Every Day Smoker    Types: Cigarettes  . Smokeless tobacco: Never Used  . Alcohol use No     Allergies   Patient has no known  allergies.   Review of Systems Review of Systems  Constitutional: Negative for chills and fever.  Eyes: Negative for pain and visual disturbance.  Respiratory: Positive for shortness of breath. Negative for cough, chest tightness, wheezing and stridor.   Cardiovascular: Positive for chest pain and leg swelling. Negative for palpitations.  Gastrointestinal: Negative for abdominal distention, abdominal pain, nausea and vomiting.  Genitourinary: Negative for dysuria, flank pain, frequency and hematuria.  Musculoskeletal: Positive for arthralgias and joint swelling. Negative for back pain.  Skin: Positive for color change. Negative for pallor and rash.       She reports color change in her feet are more red than before.  Neurological: Negative for seizures and syncope.     Physical Exam Updated Vital Signs BP (!) 149/92   Pulse 71   Temp 98.3 F (36.8 C) (Oral)   Resp 14   Ht 6' (1.829 m)   Wt 129.3 kg (285 lb)   LMP 03/03/2017 (Exact Date)   SpO2 100%   BMI 38.65 kg/m   Physical Exam  Constitutional: She appears well-developed and well-nourished. No distress.  HENT:  Head: Normocephalic and atraumatic.  Eyes: Conjunctivae are normal.  Neck: Neck supple.  Cardiovascular: Normal rate, regular rhythm, normal heart sounds and intact distal pulses.   No murmur heard. Pulmonary/Chest: Effort normal and breath sounds normal. No respiratory distress. She has no wheezes. She has no rales.  Musculoskeletal: Normal range of motion. She exhibits edema and tenderness. She exhibits no deformity.  Tenderness over achille left Bilateral 2+ pitting lower extremity edema Negative thompson No warmth, erythema or concern for infection  Neurological: She is alert. A sensory deficit is present.  Reports decreased light touch sensation to left foot, mainly great toe  Skin: Skin is warm and dry. No rash noted. She is not diaphoretic. There is erythema. No pallor.  Psychiatric: She has a normal  mood and affect.  Nursing note and vitals reviewed.    ED Treatments / Results  Labs (all labs ordered are listed, but only abnormal results are displayed) Labs Reviewed  I-STAT CHEM 8, ED - Abnormal; Notable for the following:       Result Value   Creatinine, Ser 1.10 (*)    All other components within normal limits  I-STAT TROPOININ, ED    EKG  EKG Interpretation  Date/Time:  Tuesday March 13 2017 15:01:25 EDT Ventricular Rate:  79 PR Interval:    QRS Duration: 84 QT Interval:  389 QTC Calculation: 446 R Axis:   80 Text Interpretation:  Sinus rhythm No significant change since last tracing Confirmed by Ashok Cordia  MD, Lennette Bihari ([id number]) on 03/13/2017 4:02:01 PM       Radiology Dg Foot Complete Left  Result Date: 03/13/2017 CLINICAL DATA:  41 year old presenting with two-month history of pain and swelling involving the insertion of the left Achilles onto the calcaneus. No known  injuries. EXAM: LEFT FOOT - COMPLETE 3+ VIEW COMPARISON:  None. FINDINGS: Soft tissue swelling overlying the distal left Achilles tendon, with likely edema involving the distal tendon itself. No underlying osseous abnormality involving the posterior calcaneus. Small plantar calcaneal spur. Left foot otherwise normal in appearance. Well preserved joint spaces. Well-preserved bone mineral density. IMPRESSION: 1. Soft tissue swelling overlying the distal Achilles tendon with likely edema in the distal tendon itself. 2. Small plantar calcaneal spur.  Otherwise normal left foot. Electronically Signed   By: Evangeline Dakin M.D.   On: 03/13/2017 15:33    Procedures Procedures (including critical care time)  Medications Ordered in ED Medications - No data to display   Initial Impression / Assessment and Plan / ED Course  I have reviewed the triage vital signs and the nursing notes.  Pertinent labs & imaging results that were available during my care of the patient were reviewed by me and considered in my medical  decision making (see chart for details).    Patient presents with bilateral chronic pitting edema. She was seen on 03/04/17 for same and recommended follow up with PCP, reduced salt intake, elevation. Patient has not been consistent with recommendations and has not followed up. She also reports sob on exertion and intermittent episodes of chest pain. Negative troponin, EKG remains unchanged from last tracing. Heart score: 1 Perc negative   Negative thompson test. Good plantar flexion with calf squeeze bilaterally. We'll have patient's left foot wrap for support and follow-up with orthopedics.  Discharge home with conservative management, rice protocol, orthopedics and primary care follow-up.  Provided patient education and sodium intake and the importance of primary care.  Discussed strict return precautions and advised to return to the emergency department if experiencing any new or worsening symptoms. Instructions were understood and patient agreed with discharge plan.  Final Clinical Impressions(s) / ED Diagnoses   Final diagnoses:  Peripheral edema  Achilles tendinitis of left lower extremity    New Prescriptions Discharge Medication List as of 03/13/2017  4:59 PM       Emeline General, PA-C 03/13/17 1844    Lajean Saver, MD 03/16/17 1740

## 2017-04-12 ENCOUNTER — Ambulatory Visit: Payer: Medicaid Other | Attending: Orthopedic Surgery | Admitting: Physical Therapy

## 2017-06-04 IMAGING — CR DG FINGER THUMB 2+V*R*
3 series · 3 of 3 positions shown · non-contrast
Comparison: Right hand October 13, 2012

CLINICAL DATA: Laceration from broken glass

EXAM:
RIGHT THUMB 2+V

[x finger pa right]
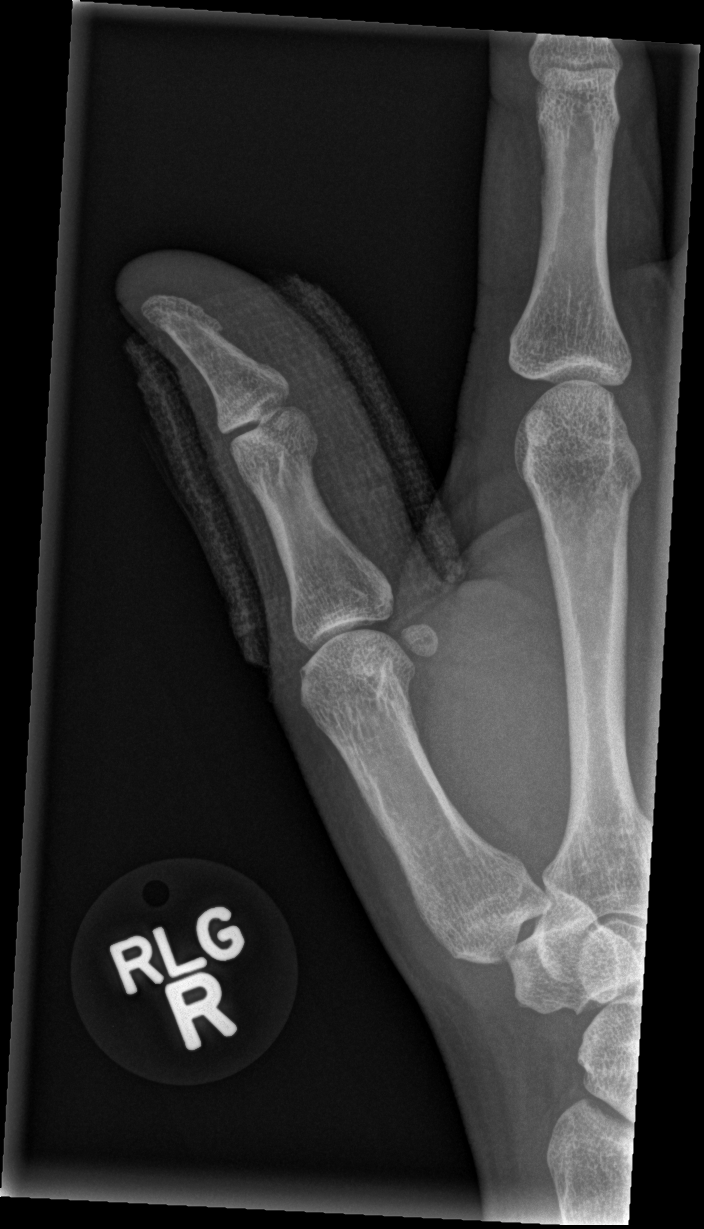

[x finger obl right]
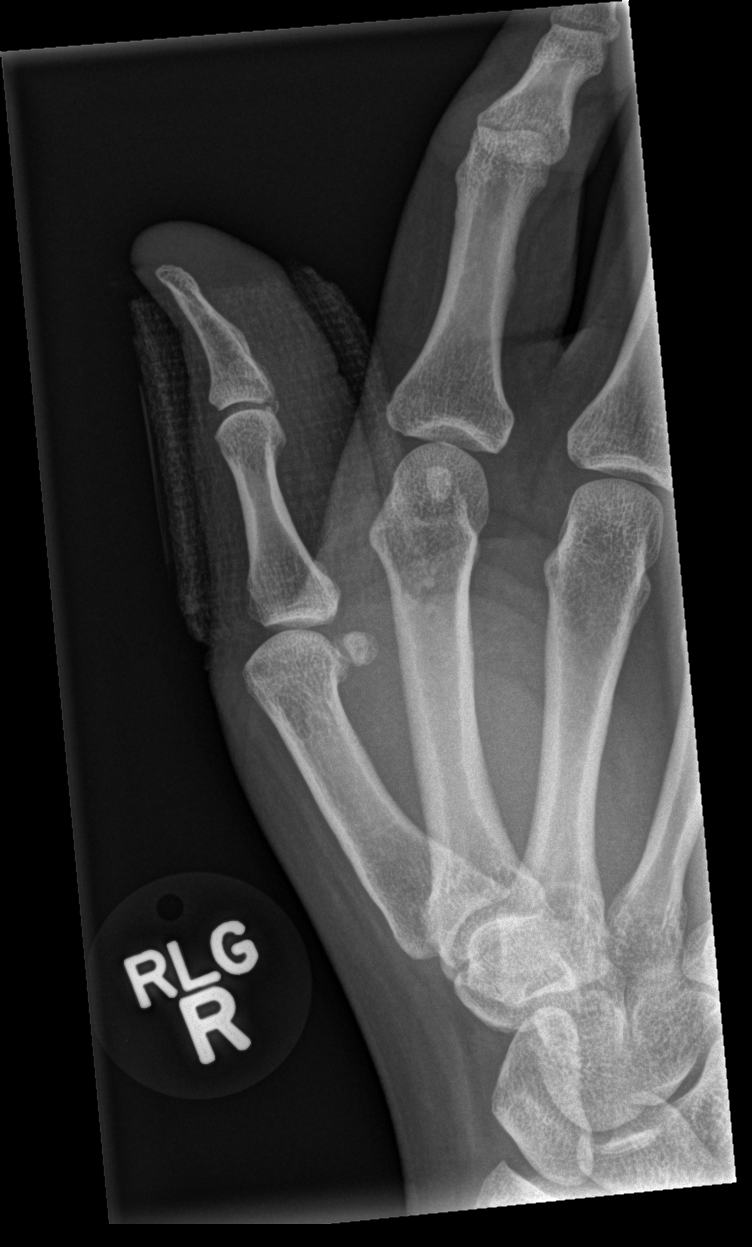

[x finger lat right]
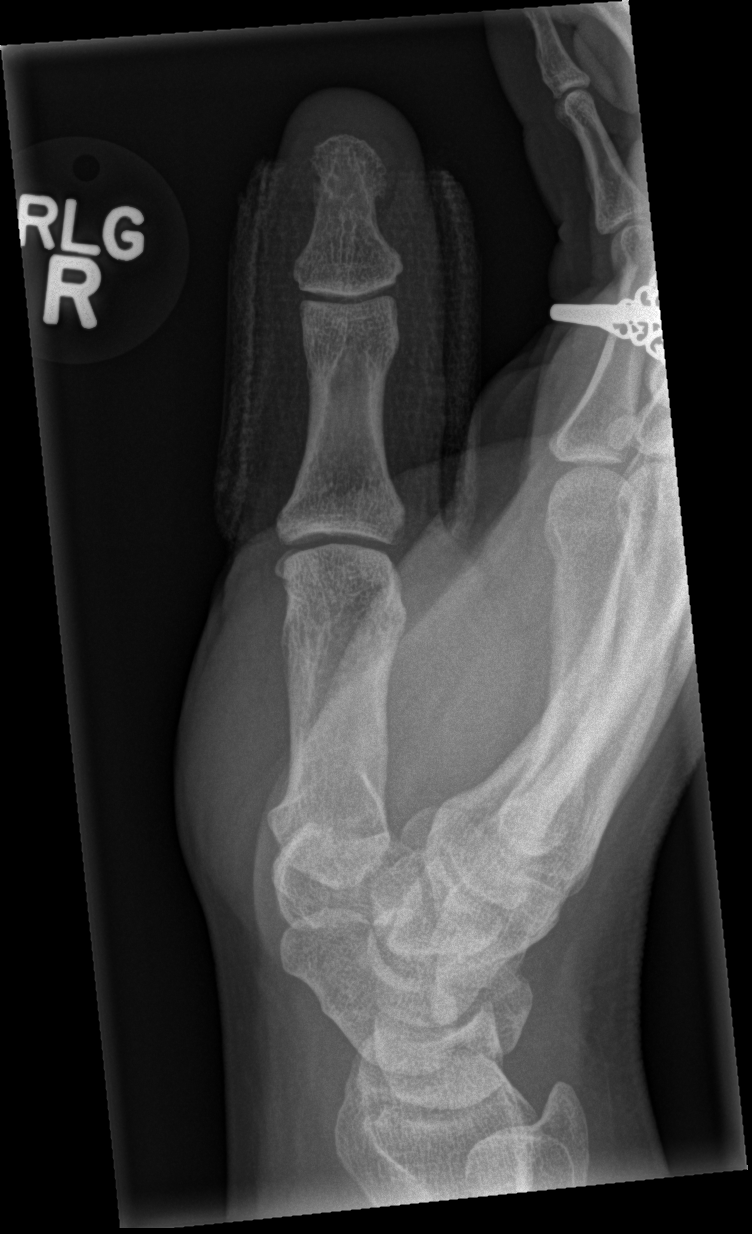

[3 of 3 positions shown; findings below may reference images not displayed]

FINDINGS: Frontal, oblique, and lateral views were obtained. There is an
overlying bandage. No other radiopaque foreign body evident. There
is no fracture or dislocation. Joint spaces appear normal. No
erosive change.
IMPRESSION: No fracture or dislocation. No apparent arthropathy. No radiopaque
foreign body appreciable beyond overlying bandage.

## 2017-07-09 ENCOUNTER — Emergency Department (HOSPITAL_COMMUNITY)
Admission: EM | Admit: 2017-07-09 | Discharge: 2017-07-09 | Disposition: A | Discharge status: Home / Self Care | Payer: Medicaid Other | Attending: Emergency Medicine | Admitting: Emergency Medicine

## 2017-07-09 ENCOUNTER — Encounter (HOSPITAL_COMMUNITY): Payer: Self-pay | Admitting: Emergency Medicine

## 2017-07-09 DIAGNOSIS — B029 Zoster without complications: Secondary | ICD-10-CM

## 2017-07-09 DIAGNOSIS — Z79899 Other long term (current) drug therapy: Secondary | ICD-10-CM | POA: Diagnosis not present

## 2017-07-09 DIAGNOSIS — R21 Rash and other nonspecific skin eruption: Secondary | ICD-10-CM | POA: Diagnosis present

## 2017-07-09 DIAGNOSIS — F17210 Nicotine dependence, cigarettes, uncomplicated: Secondary | ICD-10-CM | POA: Insufficient documentation

## 2017-07-09 HISTORY — DX: Zoster without complications: B02.9

## 2017-07-09 MED ORDER — HYDROCODONE-ACETAMINOPHEN 5-325 MG PO TABS: 1.0000 | ORAL_TABLET | ORAL | 0 refills | Status: DC | PRN

## 2017-07-09 MED ORDER — HYDROCODONE-ACETAMINOPHEN 5-325 MG PO TABS
1.0000 | ORAL_TABLET | Freq: Once | ORAL | Status: AC
Start: 2017-07-09 — End: 2017-07-09
  Administered 2017-07-09: 1 via ORAL
  Filled 2017-07-09: qty 1

## 2017-07-09 MED ORDER — VALACYCLOVIR HCL 1 G PO TABS: 1000.0000 mg | ORAL_TABLET | Freq: Three times a day (TID) | ORAL | 0 refills | Status: AC

## 2017-07-09 NOTE — ED Triage Notes (Signed)
Patient c/o painful rash on right side for couple days. States that she had shingles 3 times in same area and believes she getting them again. No open or drainage yet.

## 2017-07-09 NOTE — Discharge Instructions (Signed)
Take valtrex as prescribed until gone

## 2017-07-09 NOTE — ED Notes (Signed)
Bed: [facility] Expected date:  Expected time:  Means of arrival:  Comments: 

## 2017-07-09 NOTE — ED Provider Notes (Signed)
[facility] DEPT Provider Note   CSN: [id number] Arrival date & time: 07/09/17  0750     History   Chief Complaint Chief Complaint  Patient presents with  . Rash    HPI Ashley Stevens is a 41 y.o. female. Chief complaint is painful rash on right lower flank and back  HPI Ashley Stevens is a 41 year old female. She has had shingles twice. Her last episode was in 2015. She states that she has been dealing with her deceased father's estate and has been stressed and not sleeping. He states she does some pain around her right lower abdomen and today noticed the rash. No constitutional symptoms with fever fevers or prostration. It has become painful. No blisters yet.  Past Medical History:  Diagnosis Date  . Anxiety   . Depression   . Shingles     Patient Active Problem List   Diagnosis Date Noted  . Cocaine abuse with cocaine-induced mood disorder ([place]) 11/03/2016  . Cocaine use disorder, moderate, dependence ([place]) 05/07/2016  . Cannabis use disorder, moderate, dependence ([place]) 05/07/2016    Past Surgical History:  Procedure Laterality Date  . c-section      OB History    No data available       Home Medications    Prior to Admission medications   Medication Sig Start Date End Date Taking? Authorizing Provider  ALPRAZolam Duanne Moron) 1 MG tablet Take 1 mg by mouth 3 (three) times daily as needed for anxiety.  03/05/17   [provider]  atomoxetine (STRATTERA) 25 MG capsule Take 1 capsule (25 mg total) by mouth daily. For ADHD Patient not taking: Reported on 10/18/2016 05/11/16   Lindell Spar I, NP  benzonatate (TESSALON) 100 MG capsule Take 1 capsule (100 mg total) by mouth 3 (three) times daily as needed for cough. Patient not taking: Reported on 10/18/2016 09/28/16   Ward, Ozella Almond, PA-C  gabapentin (NEURONTIN) 100 MG capsule Take 2 capsules (200 mg total) by mouth 3 (three) times daily. For agitation Patient not taking: Reported on 10/18/2016 05/11/16   Lindell Spar I, NP  HYDROcodone-acetaminophen (NORCO/VICODIN) 5-325 MG tablet Take 1 tablet by mouth every 4 (four) hours as needed. 07/09/17   Tanna Furry, MD  hydrOXYzine (ATARAX/VISTARIL) 25 MG tablet Take 1 tablet (25 mg total) by mouth every 6 (six) hours as needed for anxiety. Patient not taking: Reported on 10/18/2016 05/11/16   Lindell Spar I, NP  ibuprofen (ADVIL,MOTRIN) 200 MG tablet Take 400-600 mg by mouth every 6 (six) hours as needed for moderate pain.    [provider]  nicotine (NICODERM CQ - DOSED IN MG/24 HOURS) 14 mg/24hr patch Place 1 patch (14 mg total) onto the skin daily as needed (going to try without first). For smoking cessation Patient not taking: Reported on 10/18/2016 05/11/16   Lindell Spar I, NP  Oxycodone HCl 10 MG TABS Take 10 mg by mouth 3 (three) times daily. 03/05/17   [provider]  sertraline (ZOLOFT) 100 MG tablet Take 100 mg by mouth daily. 03/05/17   [provider]  sertraline (ZOLOFT) 50 MG tablet Take 1 tablet (50 mg total) by mouth daily. Patient not taking: Reported on 03/13/2017 11/03/16   Patrecia Pour, NP  valACYclovir (VALTREX) 1000 MG tablet Take 1 tablet (1,000 mg total) by mouth 3 (three) times daily. 07/09/17 07/23/17  Tanna Furry, MD  zolpidem (AMBIEN) 5 MG tablet Take 1 tablet (5 mg total) by mouth at bedtime as needed for sleep.  Patient not taking: Reported on 10/18/2016 05/11/16   Encarnacion Slates, NP    Family History Family History  Problem Relation Age of Onset  . Heart failure Mother   . Kidney failure Brother   . Diabetes Other   . Hypertension Other     Social History Social History  Substance Use Topics  . Smoking status: Current Every Day Smoker    Types: Cigarettes  . Smokeless tobacco: Never Used  . Alcohol use No     Allergies   Patient has no known allergies.   Review of Systems Review of Systems  Constitutional: Negative for appetite change, chills, diaphoresis, fatigue and fever.  HENT: Negative  for mouth sores, sore throat and trouble swallowing.   Eyes: Negative for visual disturbance.  Respiratory: Negative for cough, chest tightness, shortness of breath and wheezing.   Cardiovascular: Negative for chest pain.  Gastrointestinal: Positive for abdominal pain. Negative for abdominal distention, diarrhea, nausea and vomiting.  Endocrine: Negative for polydipsia, polyphagia and polyuria.  Genitourinary: Negative for dysuria, frequency and hematuria.  Musculoskeletal: Negative for gait problem.  Skin: Positive for rash. Negative for color change and pallor.  Neurological: Negative for dizziness, syncope, light-headedness and headaches.  Hematological: Does not bruise/bleed easily.  Psychiatric/Behavioral: Negative for behavioral problems and confusion.     Physical Exam Updated Vital Signs BP (!) 131/91 (BP Location: Left Arm)   Pulse 82   Temp 98.3 F (36.8 C) (Oral)   Resp 16   Ht 6' (1.829 m)   Wt 117.9 kg (260 lb)   LMP 06/18/2017   SpO2 100%   BMI 35.26 kg/m   Physical Exam  Constitutional: She is oriented to person, place, and time. She appears well-developed and well-nourished. No distress.  HENT:  Head: Normocephalic.  Eyes: Pupils are equal, round, and reactive to light. Conjunctivae are normal. No scleral icterus.  Neck: Normal range of motion. Neck supple. No thyromegaly present.  Cardiovascular: Normal rate and regular rhythm.  Exam reveals no gallop and no friction rub.   No murmur heard. Pulmonary/Chest: Effort normal and breath sounds normal. No respiratory distress. She has no wheezes. She has no rales.  Abdominal: Soft. Bowel sounds are normal. She exhibits no distension. There is no tenderness. There is no rebound.  Dermatomal rash from right lower back and right lower abdomen. Does not cross the midline. Erythema but not vesicular yet  Musculoskeletal: Normal range of motion.  Neurological: She is alert and oriented to person, place, and time.  Skin:  Skin is warm and dry. No rash noted.  Psychiatric: She has a normal mood and affect. Her behavior is normal.     ED Treatments / Results  Labs (all labs ordered are listed, but only abnormal results are displayed) Labs Reviewed - No data to display  EKG  EKG Interpretation None       Radiology No results found.  Procedures Procedures (including critical care time)  Medications Ordered in ED Medications  HYDROcodone-acetaminophen (NORCO/VICODIN) 5-325 MG per tablet 1 tablet (1 tablet Oral Given 07/09/17 0830)     Initial Impression / Assessment and Plan / ED Course  I have reviewed the triage vital signs and the nursing notes.  Pertinent labs & imaging results that were available during my care of the patient were reviewed by me and considered in my medical decision making (see chart for details).   typical herpetic rash. We'll treat with Valtrex and Vicodin.   Final Clinical Impressions(s) / ED Diagnoses  Final diagnoses:  Herpes zoster without complication    New Prescriptions Discharge Medication List as of 07/09/2017  8:24 AM    START taking these medications   Details  HYDROcodone-acetaminophen (NORCO/VICODIN) 5-325 MG tablet Take 1 tablet by mouth every 4 (four) hours as needed., Starting Mon 07/09/2017, Print    valACYclovir (VALTREX) 1000 MG tablet Take 1 tablet (1,000 mg total) by mouth 3 (three) times daily., Starting Mon 07/09/2017, Until Mon 07/23/2017, Print         Tanna Furry, MD 07/09/17 [phone]

## 2017-09-11 ENCOUNTER — Other Ambulatory Visit: Payer: Self-pay

## 2017-09-11 ENCOUNTER — Encounter (HOSPITAL_COMMUNITY): Payer: Self-pay | Admitting: Emergency Medicine

## 2017-09-11 ENCOUNTER — Emergency Department (HOSPITAL_COMMUNITY)
Admission: EM | Admit: 2017-09-11 | Discharge: 2017-09-11 | Disposition: A | Discharge status: Home / Self Care | Payer: Medicaid Other | Attending: Emergency Medicine | Admitting: Emergency Medicine

## 2017-09-11 DIAGNOSIS — F17210 Nicotine dependence, cigarettes, uncomplicated: Secondary | ICD-10-CM | POA: Insufficient documentation

## 2017-09-11 DIAGNOSIS — R51 Headache: Secondary | ICD-10-CM | POA: Diagnosis not present

## 2017-09-11 DIAGNOSIS — R5381 Other malaise: Secondary | ICD-10-CM | POA: Diagnosis not present

## 2017-09-11 DIAGNOSIS — Z79899 Other long term (current) drug therapy: Secondary | ICD-10-CM | POA: Diagnosis not present

## 2017-09-11 DIAGNOSIS — R21 Rash and other nonspecific skin eruption: Secondary | ICD-10-CM | POA: Diagnosis not present

## 2017-09-11 MED ORDER — DEXAMETHASONE SODIUM PHOSPHATE 10 MG/ML IJ SOLN
10.0000 mg | Freq: Once | INTRAMUSCULAR | Status: AC
  Administered 2017-09-11: 10 mg via INTRAMUSCULAR
  Filled 2017-09-11: qty 1

## 2017-09-11 MED ORDER — HYDROXYZINE HCL 25 MG PO TABS: 25.0000 mg | ORAL_TABLET | Freq: Four times a day (QID) | ORAL | 0 refills | Status: DC

## 2017-09-11 MED ORDER — TRAMADOL HCL 50 MG PO TABS: 50.0000 mg | ORAL_TABLET | Freq: Four times a day (QID) | ORAL | 0 refills | Status: DC | PRN

## 2017-09-11 NOTE — ED Triage Notes (Signed)
Patient reports she has recurrent shingles to buttocks area.

## 2017-09-11 NOTE — Discharge Instructions (Addendum)
The steroid shot that you had today lasts for three days Please take Tramadol for pain. You can take Ibuprofen and Tylenol with this as well for extra pain relief Take Hydroxyzine for itching Follow up with your doctor

## 2017-09-11 NOTE — ED Notes (Addendum)
Pt reports that she has sores on her buttocks, that started 1 day ago. Pt reports that she has HA associated with her shingles. Pt reports 8/10 sore burning and itching. No blistering is noted at this time.

## 2017-09-11 NOTE — ED Provider Notes (Signed)
[facility] DEPT Provider Note   CSN: [id number] Arrival date & time: 09/11/17  1633     History   Chief Complaint Chief Complaint  Patient presents with  . Herpes Zoster    HPI Ashley Stevens is a 41 y.o. female who presents with a rash.  Past medical history significant for shingles.  The patient states that the rash developed 2 days ago.  Yesterday and today it worsened.  The rash is over the buttocks area.  She states that previously she was told this is shingles and was treated with pain medicine and Valtrex which resolved the rash.  She states that typically the rash will start like this and then spread.  She does not have any blisters.  The rash is painful and itchy.  She denies any new soaps, lotions, detergents.  She states that she does have generalized malaise and a headache.  She denies fevers, nausea or vomiting.  She has had the shingles vaccine.  HPI  Past Medical History:  Diagnosis Date  . Anxiety   . Depression   . Shingles     Patient Active Problem List   Diagnosis Date Noted  . Cocaine abuse with cocaine-induced mood disorder ([place]) 11/03/2016  . Cocaine use disorder, moderate, dependence ([place]) 05/07/2016  . Cannabis use disorder, moderate, dependence ([place]) 05/07/2016    Past Surgical History:  Procedure Laterality Date  . c-section      OB History    No data available       Home Medications    Prior to Admission medications   Medication Sig Start Date End Date Taking? Authorizing Provider  ALPRAZolam Duanne Moron) 1 MG tablet Take 1 mg by mouth 3 (three) times daily as needed for anxiety.  03/05/17   [provider]  HYDROcodone-acetaminophen (NORCO/VICODIN) 5-325 MG tablet Take 1 tablet by mouth every 4 (four) hours as needed. 07/09/17   Tanna Furry, MD  hydrOXYzine (ATARAX/VISTARIL) 25 MG tablet Take 1 tablet (25 mg total) by mouth every 6 (six) hours as needed for anxiety. Patient not taking: Reported on  10/18/2016 05/11/16   Lindell Spar I, NP  ibuprofen (ADVIL,MOTRIN) 200 MG tablet Take 400-600 mg by mouth every 6 (six) hours as needed for moderate pain.    [provider]  nicotine (NICODERM CQ - DOSED IN MG/24 HOURS) 14 mg/24hr patch Place 1 patch (14 mg total) onto the skin daily as needed (going to try without first). For smoking cessation Patient not taking: Reported on 10/18/2016 05/11/16   Lindell Spar I, NP  Oxycodone HCl 10 MG TABS Take 10 mg by mouth 3 (three) times daily. 03/05/17   [provider]  sertraline (ZOLOFT) 100 MG tablet Take 100 mg by mouth daily. 03/05/17   [provider]  sertraline (ZOLOFT) 50 MG tablet Take 1 tablet (50 mg total) by mouth daily. Patient not taking: Reported on 03/13/2017 11/03/16   Patrecia Pour, NP  zolpidem (AMBIEN) 5 MG tablet Take 1 tablet (5 mg total) by mouth at bedtime as needed for sleep. Patient not taking: Reported on 10/18/2016 05/11/16   Encarnacion Slates, NP    Family History Family History  Problem Relation Age of Onset  . Heart failure Mother   . Kidney failure Brother   . Diabetes Other   . Hypertension Other     Social History Social History   Tobacco Use  . Smoking status: Current Every Day Smoker    Types: Cigarettes  .  Smokeless tobacco: Never Used  Substance Use Topics  . Alcohol use: No  . Drug use: No    Comment: Pt denies     Allergies   Patient has no known allergies.   Review of Systems Review of Systems  Constitutional: Negative for fever.       +malaise  Skin: Positive for rash.     Physical Exam Updated Vital Signs BP (!) 137/97   Pulse 98   Temp 98.5 F (36.9 C) (Oral)   Ht 6' (1.829 m)   Wt 117.9 kg (260 lb)   LMP 08/26/2017   SpO2 98%   BMI 35.26 kg/m   Physical Exam  Constitutional: She is oriented to person, place, and time. She appears well-developed and well-nourished. No distress.  HENT:  Head: Normocephalic and atraumatic.  Eyes: Conjunctivae are normal.  Pupils are equal, round, and reactive to light. Right eye exhibits no discharge. Left eye exhibits no discharge. No scleral icterus.  Neck: Normal range of motion.  Cardiovascular: Normal rate.  Pulmonary/Chest: Effort normal. No respiratory distress.  Abdominal: She exhibits no distension.  Neurological: She is alert and oriented to person, place, and time.  Skin: Skin is warm and dry. Rash (Diffuse, erythematous, patchy and mildly raised rash across the entire buttocks. No vesicular lesions) noted. There is erythema.  Psychiatric: She has a normal mood and affect. Her behavior is normal.  Nursing note and vitals reviewed.    ED Treatments / Results  Labs (all labs ordered are listed, but only abnormal results are displayed) Labs Reviewed - No data to display  EKG  EKG Interpretation None       Radiology No results found.  Procedures Procedures (including critical care time)  Medications Ordered in ED Medications  dexamethasone (DECADRON) injection 10 mg (10 mg Intramuscular Given 09/11/17 1859)     Initial Impression / Assessment and Plan / ED Course  I have reviewed the triage vital signs and the nursing notes.  Pertinent labs & imaging results that were available during my care of the patient were reviewed by me and considered in my medical decision making (see chart for details).  41 year old female with a nonspecific rash.  Vital signs are normal.  She states that it similar to prior episodes of shingles.  The rash does not appear consistent with shingles since it it crosses the midline.  There are no vesicular lesions.  She had visit with Dr. Gilford Raid.  We will treat for a contact dermatitis.  IM Decadron was given.  She was given tramadol for pain and Atarax for itching. Return precautions given.  Final Clinical Impressions(s) / ED Diagnoses   Final diagnoses:  Rash    ED Discharge Orders    None       Recardo Evangelist, PA-C 09/11/17 1943      Isla Pence, MD 09/11/17 2207

## 2017-10-12 ENCOUNTER — Encounter (HOSPITAL_COMMUNITY): Payer: Self-pay

## 2017-10-12 ENCOUNTER — Emergency Department (HOSPITAL_COMMUNITY)
Admission: EM | Admit: 2017-10-12 | Discharge: 2017-10-12 | Disposition: A | Discharge status: Other Acute Inpatient Hospital | Payer: Medicaid Other | Attending: Emergency Medicine | Admitting: Emergency Medicine

## 2017-10-12 DIAGNOSIS — F32A Depression, unspecified: Secondary | ICD-10-CM

## 2017-10-12 DIAGNOSIS — T50902A Poisoning by unspecified drugs, medicaments and biological substances, intentional self-harm, initial encounter: Secondary | ICD-10-CM | POA: Insufficient documentation

## 2017-10-12 DIAGNOSIS — Y9389 Activity, other specified: Secondary | ICD-10-CM | POA: Insufficient documentation

## 2017-10-12 DIAGNOSIS — Z046 Encounter for general psychiatric examination, requested by authority: Secondary | ICD-10-CM | POA: Diagnosis not present

## 2017-10-12 DIAGNOSIS — Y999 Unspecified external cause status: Secondary | ICD-10-CM | POA: Diagnosis not present

## 2017-10-12 DIAGNOSIS — Z79899 Other long term (current) drug therapy: Secondary | ICD-10-CM | POA: Diagnosis not present

## 2017-10-12 DIAGNOSIS — R45851 Suicidal ideations: Secondary | ICD-10-CM | POA: Insufficient documentation

## 2017-10-12 DIAGNOSIS — F329 Major depressive disorder, single episode, unspecified: Secondary | ICD-10-CM | POA: Insufficient documentation

## 2017-10-12 DIAGNOSIS — F17210 Nicotine dependence, cigarettes, uncomplicated: Secondary | ICD-10-CM | POA: Insufficient documentation

## 2017-10-12 DIAGNOSIS — X838XXA Intentional self-harm by other specified means, initial encounter: Secondary | ICD-10-CM | POA: Insufficient documentation

## 2017-10-12 DIAGNOSIS — Y929 Unspecified place or not applicable: Secondary | ICD-10-CM | POA: Insufficient documentation

## 2017-10-12 HISTORY — DX: Suicidal ideations: R45.851

## 2017-10-12 LAB — RAPID URINE DRUG SCREEN, HOSP PERFORMED
Amphetamines: NOT DETECTED
Barbiturates: NOT DETECTED
Benzodiazepines: POSITIVE — AB
Cocaine: POSITIVE — AB
OPIATES: NOT DETECTED
Tetrahydrocannabinol: NOT DETECTED

## 2017-10-12 LAB — CBC
HCT: 45.1 % (ref 36.0–46.0)
Hemoglobin: 14.3 g/dL (ref 12.0–15.0)
MCH: 26.8 pg (ref 26.0–34.0)
MCHC: 31.7 g/dL (ref 30.0–36.0)
MCV: 84.5 fL (ref 78.0–100.0)
PLATELETS: 338 10*3/uL (ref 150–400)
RBC: 5.34 MIL/uL — ABNORMAL HIGH (ref 3.87–5.11)
RDW: 13.8 % (ref 11.5–15.5)
WBC: 9.3 10*3/uL (ref 4.0–10.5)

## 2017-10-12 LAB — COMPREHENSIVE METABOLIC PANEL
ALK PHOS: 67 U/L (ref 38–126)
ALT: 8 U/L — AB (ref 14–54)
AST: 17 U/L (ref 15–41)
Albumin: 3.2 g/dL — ABNORMAL LOW (ref 3.5–5.0)
Anion gap: 4 — ABNORMAL LOW (ref 5–15)
BILIRUBIN TOTAL: 0.6 mg/dL (ref 0.3–1.2)
BUN: 9 mg/dL (ref 6–20)
CALCIUM: 8.8 mg/dL — AB (ref 8.9–10.3)
CO2: 26 mmol/L (ref 22–32)
CREATININE: 1.09 mg/dL — AB (ref 0.44–1.00)
Chloride: 108 mmol/L (ref 101–111)
Glucose, Bld: 101 mg/dL — ABNORMAL HIGH (ref 65–99)
Potassium: 3.6 mmol/L (ref 3.5–5.1)
Sodium: 138 mmol/L (ref 135–145)
Total Protein: 6.6 g/dL (ref 6.5–8.1)

## 2017-10-12 LAB — ACETAMINOPHEN LEVEL: Acetaminophen (Tylenol), Serum: <10 ug/mL — ABNORMAL LOW (ref 10–30)

## 2017-10-12 LAB — I-STAT BETA HCG BLOOD, ED (MC, WL, AP ONLY)

## 2017-10-12 LAB — SALICYLATE LEVEL

## 2017-10-12 LAB — ETHANOL

## 2017-10-12 LAB — POC URINE PREG, ED: PREG TEST UR: NEGATIVE

## 2017-10-12 MED ORDER — NICOTINE 21 MG/24HR TD PT24: 21.0000 mg | MEDICATED_PATCH | Freq: Every day | TRANSDERMAL | Status: DC

## 2017-10-12 NOTE — ED Notes (Signed)
ED Provider at bedside. 

## 2017-10-12 NOTE — ED Notes (Signed)
EDP PLUNKETT STATED PT TOOK MEDICATION YESTERDAY 0800. SUPPORTIVE CARE.

## 2017-10-12 NOTE — ED Notes (Signed)
ATTEMPTED TO PLACE IV WITH BLOOD DRAW X 2 NOT SUCCESSFUL. CHARGE STACEY RN MADE AWARE. DELAY IN EKG. PERFORMING NOW

## 2017-10-12 NOTE — ED Notes (Signed)
ATTEMPTED TO GIVE REPORT TO [facility]. Kiaira Pointer RN STATES PT HAS BEEN ACCEPTED BACK TO [facility]. [place]. PT HAS BEEN ACCEPTED ONCE MEDICALLY CLEARED. EDP PLUNKETT MADE AWARE [facility] ACCEPTANCE.  THIS WRITER NEEDING VARICATION ON TRANSPORTATION FOR THIS PT.

## 2017-10-12 NOTE — ED Notes (Signed)
Bed: FP82 Expected date:  Expected time:  Means of arrival:  Comments: EMS-OD Xanax

## 2017-10-12 NOTE — ED Triage Notes (Signed)
Per [facility]. Pt resides at home. Pt request to be confidential. Pt admits to intentional overdose. Xanax unknown amount or mg. 0800 today. 40 tablets. Pt drove herself to [place] 12 noon and brought here for medical clearance. Pt is ambulatory however incontinent of urine. Pt alert and oriented. EKG NSR.

## 2017-10-12 NOTE — ED Notes (Signed)
PT CONTINUES TO AMBULATE IN UNIT WITHOUT DIFFICULTY. TOLERATING PO INTAKE

## 2017-10-12 NOTE — ED Provider Notes (Addendum)
[facility] DEPT Provider Note   CSN: [id number] Arrival date & time: 10/12/17  1238     History   Chief Complaint Chief Complaint  Patient presents with  . Drug Overdose  . Suicidal    HPI BRITZY GRAUL is a 42 y.o. female.  Patient is a 42 year old female with a history of anxiety, depression and cocaine abuse who is presenting today for medical clearance.  Patient states that she has been under a lot of stress recently and she just wanted to end her life.  Around 830 yesterday morning she took 40 Xanax, for Zoloft and some sleeping pill she had leftover.  She woke up this morning and was angry that she was still alive.  Her boyfriend stated he did not want to live with someone like that and dropped her off along the side of the road.  Patient states [facility] was close and she walked there and they sent her here for medical clearance.  She is still depressed and sad.  She still feels suicidal.  She denies any pain, cough, shortness of breath.  She is not taking any medications today.     The history is provided by the patient.  Drug Overdose  This is a new problem. The current episode started yesterday. The problem occurs constantly. The problem has been resolved. Pertinent negatives include no chest pain, no abdominal pain, no headaches and no shortness of breath. Nothing aggravates the symptoms. Nothing relieves the symptoms.    Past Medical History:  Diagnosis Date  . Anxiety   . Depression   . Shingles     Patient Active Problem List   Diagnosis Date Noted  . Cocaine abuse with cocaine-induced mood disorder ([place]) 11/03/2016  . Cocaine use disorder, moderate, dependence ([place]) 05/07/2016  . Cannabis use disorder, moderate, dependence ([place]) 05/07/2016    Past Surgical History:  Procedure Laterality Date  . c-section      OB History    No data available       Home Medications    Prior to Admission medications   Medication Sig  Start Date End Date Taking? Authorizing Provider  ALPRAZolam Duanne Moron) 1 MG tablet Take 1 mg by mouth 3 (three) times daily as needed for anxiety.  03/05/17   [provider]  HYDROcodone-acetaminophen (NORCO/VICODIN) 5-325 MG tablet Take 1 tablet by mouth every 4 (four) hours as needed. 07/09/17   Tanna Furry, MD  hydrOXYzine (ATARAX/VISTARIL) 25 MG tablet Take 1 tablet (25 mg total) by mouth every 6 (six) hours. 09/11/17   Recardo Evangelist, PA-C  ibuprofen (ADVIL,MOTRIN) 200 MG tablet Take 400-600 mg by mouth every 6 (six) hours as needed for moderate pain.    [provider]  nicotine (NICODERM CQ - DOSED IN MG/24 HOURS) 14 mg/24hr patch Place 1 patch (14 mg total) onto the skin daily as needed (going to try without first). For smoking cessation Patient not taking: Reported on 10/18/2016 05/11/16   Lindell Spar I, NP  Oxycodone HCl 10 MG TABS Take 10 mg by mouth 3 (three) times daily. 03/05/17   [provider]  sertraline (ZOLOFT) 100 MG tablet Take 100 mg by mouth daily. 03/05/17   [provider]  traMADol (ULTRAM) 50 MG tablet Take 1 tablet (50 mg total) by mouth every 6 (six) hours as needed. 09/11/17   Recardo Evangelist, PA-C    Family History Family History  Problem Relation Age of Onset  . Heart failure Mother   .  Kidney failure Brother   . Diabetes Other   . Hypertension Other     Social History Social History   Tobacco Use  . Smoking status: Current Every Day Smoker    Types: Cigarettes  . Smokeless tobacco: Never Used  Substance Use Topics  . Alcohol use: No  . Drug use: No    Comment: Pt denies     Allergies   Patient has no known allergies.   Review of Systems Review of Systems  Respiratory: Negative for shortness of breath.   Cardiovascular: Negative for chest pain.  Gastrointestinal: Negative for abdominal pain.  Neurological: Negative for headaches.  All other systems reviewed and are negative.    Physical Exam Updated  Vital Signs SpO2 99%   Physical Exam  Constitutional: She is oriented to person, place, and time. She appears well-developed and well-nourished. No distress.  HENT:  Head: Normocephalic and atraumatic.  Mouth/Throat: Oropharynx is clear and moist.  Eyes: Conjunctivae and EOM are normal. Pupils are equal, round, and reactive to light.  Neck: Normal range of motion. Neck supple.  Cardiovascular: Normal rate, regular rhythm and intact distal pulses.  Murmur heard.  Systolic murmur is present with a grade of 2/6. Pulmonary/Chest: Effort normal and breath sounds normal. No respiratory distress. She has no wheezes. She has no rales.  Abdominal: Soft. She exhibits no distension. There is no tenderness. There is no rebound and no guarding.  Musculoskeletal: Normal range of motion. She exhibits no edema or tenderness.  Neurological: She is alert and oriented to person, place, and time.  Skin: Skin is warm and dry. No rash noted. No erythema.  Psychiatric: Her affect is blunt. She is slowed. Thought content is not paranoid and not delusional. She exhibits a depressed mood. She expresses suicidal ideation. She expresses no homicidal ideation. She expresses suicidal plans.  Nursing note and vitals reviewed.    ED Treatments / Results  Labs (all labs ordered are listed, but only abnormal results are displayed) Labs Reviewed  COMPREHENSIVE METABOLIC PANEL - Abnormal; Notable for the following components:      Result Value   Glucose, Bld 101 (*)    Creatinine, Ser 1.09 (*)    Calcium 8.8 (*)    Albumin 3.2 (*)    ALT 8 (*)    Anion gap 4 (*)    All other components within normal limits  ACETAMINOPHEN LEVEL - Abnormal; Notable for the following components:   Acetaminophen (Tylenol), Serum <10 (*)    All other components within normal limits  CBC - Abnormal; Notable for the following components:   RBC 5.34 (*)    All other components within normal limits  RAPID URINE DRUG SCREEN, HOSP  PERFORMED - Abnormal; Notable for the following components:   Cocaine POSITIVE (*)    Benzodiazepines POSITIVE (*)    All other components within normal limits  ETHANOL  SALICYLATE LEVEL  I-STAT BETA HCG BLOOD, ED ([facility], [facility], [facility] ONLY)  POC URINE PREG, ED  CBG MONITORING, ED    EKG  EKG Interpretation  Date/Time:  Friday October 12 2017 14:00:05 EST Ventricular Rate:  76 PR Interval:  156 QRS Duration: 76 QT Interval:  412 QTC Calculation: 463 R Axis:   77 Text Interpretation:  Normal sinus rhythm Normal ECG No significant change since last tracing Confirmed by Blanchie Dessert [phone]) on 10/12/2017 2:03:52 PM       Radiology No results found.  Procedures Procedures (including critical care time)  Medications Ordered in  ED Medications - No data to display   Initial Impression / Assessment and Plan / ED Course  I have reviewed the triage vital signs and the nursing notes.  Pertinent labs & imaging results that were available during my care of the patient were reviewed by me and considered in my medical decision making (see chart for details).     Patient presenting today after overdosing on Xanax, her sleeping pills and Zoloft yesterday.  Patient states she woke up this morning and she was angry that she was not dead.  Patient was seen at [facility] and sent here for further evaluation.  Patient appears to be medically stable at this time.  She is a awake alert and oriented.  Heart and lung exams without acute concern.  Will check medical screening labs as well as acetaminophen and salicylate levels.  EKG pending  3:09 PM Labs wnl except for positive for benzos and cocaine.  EKG wnl.  Pt is medically clear  3:55 PM Pt transferred [facility] to [facility]  Final Clinical Impressions(s) / ED Diagnoses   Final diagnoses:  Suicidal ideation  Depression, unspecified depression type  Intentional drug overdose, initial encounter [facility])    ED Discharge Orders    None         Blanchie Dessert, MD 10/12/17 1510    Blanchie Dessert, MD 10/12/17 1556

## 2017-10-12 NOTE — ED Notes (Signed)
[facility] HERE FOR TRANSPORT. BELONGINGS GIVEN.

## 2017-11-09 ENCOUNTER — Other Ambulatory Visit: Payer: Self-pay

## 2017-11-09 ENCOUNTER — Inpatient Hospital Stay (HOSPITAL_COMMUNITY)
Admission: RE | Admit: 2017-11-09 | Discharge: 2017-11-15 | DRG: 885 | Disposition: A | Discharge status: Home / Self Care | Payer: Medicaid Other | Attending: Psychiatry | Admitting: Psychiatry

## 2017-11-09 ENCOUNTER — Encounter (HOSPITAL_COMMUNITY): Payer: Self-pay | Admitting: *Deleted

## 2017-11-09 DIAGNOSIS — F17210 Nicotine dependence, cigarettes, uncomplicated: Secondary | ICD-10-CM | POA: Diagnosis not present

## 2017-11-09 DIAGNOSIS — Z818 Family history of other mental and behavioral disorders: Secondary | ICD-10-CM

## 2017-11-09 DIAGNOSIS — Z79899 Other long term (current) drug therapy: Secondary | ICD-10-CM | POA: Diagnosis not present

## 2017-11-09 DIAGNOSIS — G4700 Insomnia, unspecified: Secondary | ICD-10-CM | POA: Diagnosis present

## 2017-11-09 DIAGNOSIS — F4010 Social phobia, unspecified: Secondary | ICD-10-CM | POA: Diagnosis not present

## 2017-11-09 DIAGNOSIS — G8929 Other chronic pain: Secondary | ICD-10-CM | POA: Diagnosis present

## 2017-11-09 DIAGNOSIS — Z653 Problems related to other legal circumstances: Secondary | ICD-10-CM

## 2017-11-09 DIAGNOSIS — F1210 Cannabis abuse, uncomplicated: Secondary | ICD-10-CM | POA: Diagnosis present

## 2017-11-09 DIAGNOSIS — F322 Major depressive disorder, single episode, severe without psychotic features: Secondary | ICD-10-CM | POA: Diagnosis not present

## 2017-11-09 DIAGNOSIS — Z915 Personal history of self-harm: Secondary | ICD-10-CM

## 2017-11-09 DIAGNOSIS — M792 Neuralgia and neuritis, unspecified: Secondary | ICD-10-CM | POA: Diagnosis present

## 2017-11-09 DIAGNOSIS — F1424 Cocaine dependence with cocaine-induced mood disorder: Secondary | ICD-10-CM | POA: Diagnosis present

## 2017-11-09 DIAGNOSIS — M545 Low back pain: Secondary | ICD-10-CM | POA: Diagnosis present

## 2017-11-09 DIAGNOSIS — R45851 Suicidal ideations: Secondary | ICD-10-CM | POA: Diagnosis present

## 2017-11-09 DIAGNOSIS — Z560 Unemployment, unspecified: Secondary | ICD-10-CM | POA: Diagnosis not present

## 2017-11-09 DIAGNOSIS — F419 Anxiety disorder, unspecified: Secondary | ICD-10-CM | POA: Diagnosis not present

## 2017-11-09 DIAGNOSIS — F332 Major depressive disorder, recurrent severe without psychotic features: Principal | ICD-10-CM | POA: Diagnosis present

## 2017-11-09 DIAGNOSIS — F1420 Cocaine dependence, uncomplicated: Secondary | ICD-10-CM | POA: Diagnosis present

## 2017-11-09 DIAGNOSIS — Z91410 Personal history of adult physical and sexual abuse: Secondary | ICD-10-CM | POA: Diagnosis not present

## 2017-11-09 MED ORDER — HYDROXYZINE HCL 25 MG PO TABS
25.0000 mg | ORAL_TABLET | Freq: Four times a day (QID) | ORAL | Status: DC
  Filled 2017-11-09 (×3): qty 1

## 2017-11-09 MED ORDER — ACETAMINOPHEN 325 MG PO TABS: 650.0000 mg | ORAL_TABLET | Freq: Four times a day (QID) | ORAL | Status: DC | PRN

## 2017-11-09 MED ORDER — SERTRALINE HCL 50 MG PO TABS
50.0000 mg | ORAL_TABLET | Freq: Every day | ORAL | Status: DC
  Administered 2017-11-10: 50 mg via ORAL
  Filled 2017-11-09 (×4): qty 1

## 2017-11-09 MED ORDER — ALUM & MAG HYDROXIDE-SIMETH 200-200-20 MG/5ML PO SUSP
30.0000 mL | ORAL | Status: DC | PRN
Start: 2017-11-09 — End: 2017-11-16

## 2017-11-09 MED ORDER — HYDROXYZINE HCL 25 MG PO TABS
25.0000 mg | ORAL_TABLET | Freq: Four times a day (QID) | ORAL | Status: DC | PRN
Start: 2017-11-09 — End: 2017-11-10

## 2017-11-09 MED ORDER — MAGNESIUM HYDROXIDE 400 MG/5ML PO SUSP: 30.0000 mL | Freq: Every day | ORAL | Status: DC | PRN

## 2017-11-09 MED ORDER — TRAZODONE HCL 50 MG PO TABS
50.0000 mg | ORAL_TABLET | Freq: Every evening | ORAL | Status: DC | PRN
  Administered 2017-11-14: 50 mg via ORAL
  Filled 2017-11-09 (×3): qty 1

## 2017-11-09 MED ORDER — NICOTINE 14 MG/24HR TD PT24: 14.0000 mg | MEDICATED_PATCH | Freq: Every day | TRANSDERMAL | Status: DC | PRN

## 2017-11-09 NOTE — Progress Notes (Signed)
Ashley Stevens is a 42 year old female pt admitted on voluntary basis after presenting as a walk-in. On admission, Ashley Stevens is cooperative but appears anxious and fidgety. She reports that she has been feeling depressed and suicidal however is able to contract for safety while in the hospital. She reports taking overdose of xanax and zoloft about a month ago and reports that she was hospitalized at [facility] for about 10 days. She reports she was getting medications through [facility] and she spoke about while she is here that she wants to get a proper assessment and find out what is wrong with her mentally. She reports that she has started to abuse cocaine about a year ago and spoke about how a friend introduced it to her. She also reports that she has been having troubles sleeping recently and has not been taking care of herself as she should. Ashley Stevens reports that she lives with her husband and reports that she will go back there upon discharge. Ashley Stevens was oriented to the unit and safety maintained.
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
## 2017-11-09 NOTE — Progress Notes (Signed)
Report received from admitting nurse. Patient denies SI/HI/AVH and pain at this time. Orders received and acknowledged.  Medications administered as per order. Patient verbally contracts for safety on the unit and agrees to come to staff before acting on any self harm thoughts/feelings and other needs or concerns. 15 min checks initiated for safety and patient oriented to the unit and the unit schedule. 

## 2017-11-09 NOTE — Tx Team (Signed)
Initial Treatment Plan 11/09/2017 7:08 PM Ashley Stevens EML:[id number]    PATIENT STRESSORS: Health problems Marital or family conflict Medication change or noncompliance Substance abuse   PATIENT STRENGTHS: Ability for insight Average or above average intelligence Capable of independent living Communication skills General fund of knowledge   PATIENT IDENTIFIED PROBLEMS: Depression Insomnia Suicidal thoughts Cocaine abuse "I want to know what's wrong with me mentally"                     DISCHARGE CRITERIA:  Ability to meet basic life and health needs Improved stabilization in mood, thinking, and/or behavior Reduction of life-threatening or endangering symptoms to within safe limits Verbal commitment to aftercare and medication compliance  PRELIMINARY DISCHARGE PLAN: Attend aftercare/continuing care group Return to previous living arrangement  PATIENT/FAMILY INVOLVEMENT: This treatment plan has been presented to and reviewed with the patient, Ashley Stevens, and/or family member, .  The patient and family have been given the opportunity to ask questions and make suggestions.  Isabel Freese, [place], [place] 11/09/2017, 7:08 PM
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
## 2017-11-10 DIAGNOSIS — F419 Anxiety disorder, unspecified: Secondary | ICD-10-CM

## 2017-11-10 DIAGNOSIS — R45851 Suicidal ideations: Secondary | ICD-10-CM

## 2017-11-10 DIAGNOSIS — F17210 Nicotine dependence, cigarettes, uncomplicated: Secondary | ICD-10-CM

## 2017-11-10 DIAGNOSIS — Z653 Problems related to other legal circumstances: Secondary | ICD-10-CM

## 2017-11-10 DIAGNOSIS — Z818 Family history of other mental and behavioral disorders: Secondary | ICD-10-CM

## 2017-11-10 DIAGNOSIS — F332 Major depressive disorder, recurrent severe without psychotic features: Principal | ICD-10-CM

## 2017-11-10 DIAGNOSIS — F4010 Social phobia, unspecified: Secondary | ICD-10-CM

## 2017-11-10 LAB — CBC
HCT: 38.1 % (ref 36.0–46.0)
HEMOGLOBIN: 12.2 g/dL (ref 12.0–15.0)
MCH: 26.5 pg (ref 26.0–34.0)
MCHC: 32 g/dL (ref 30.0–36.0)
MCV: 82.8 fL (ref 78.0–100.0)
PLATELETS: 307 10*3/uL (ref 150–400)
RBC: 4.6 MIL/uL (ref 3.87–5.11)
RDW: 14.3 % (ref 11.5–15.5)
WBC: 8.9 10*3/uL (ref 4.0–10.5)

## 2017-11-10 LAB — COMPREHENSIVE METABOLIC PANEL
ALK PHOS: 81 U/L (ref 38–126)
ALT: 8 U/L — AB (ref 14–54)
AST: 17 U/L (ref 15–41)
Albumin: 3.2 g/dL — ABNORMAL LOW (ref 3.5–5.0)
Anion gap: 4 — ABNORMAL LOW (ref 5–15)
BUN: 12 mg/dL (ref 6–20)
CO2: 26 mmol/L (ref 22–32)
CREATININE: 1.4 mg/dL — AB (ref 0.44–1.00)
Calcium: 8.8 mg/dL — ABNORMAL LOW (ref 8.9–10.3)
Chloride: 108 mmol/L (ref 101–111)
GFR, EST AFRICAN AMERICAN: 53 mL/min — AB (ref >60)
GFR, EST NON AFRICAN AMERICAN: 46 mL/min — AB (ref >60)
Glucose, Bld: 100 mg/dL — ABNORMAL HIGH (ref 65–99)
Potassium: 4.2 mmol/L (ref 3.5–5.1)
Sodium: 138 mmol/L (ref 135–145)
Total Bilirubin: 0.1 mg/dL — ABNORMAL LOW (ref 0.3–1.2)
Total Protein: 6.6 g/dL (ref 6.5–8.1)

## 2017-11-10 LAB — LIPID PANEL
Cholesterol: 202 mg/dL — ABNORMAL HIGH (ref 0–200)
HDL: 51 mg/dL (ref >40)
LDL CALC: 124 mg/dL — AB (ref 0–99)
Total CHOL/HDL Ratio: 4 RATIO
Triglycerides: 137 mg/dL (ref <150)
VLDL: 27 mg/dL (ref 0–40)

## 2017-11-10 LAB — HEMOGLOBIN A1C
HEMOGLOBIN A1C: 5.9 % — AB (ref 4.8–5.6)
MEAN PLASMA GLUCOSE: 122.63 mg/dL

## 2017-11-10 LAB — TSH: TSH: 2.318 u[IU]/mL (ref 0.350–4.500)

## 2017-11-10 LAB — ETHANOL: Alcohol, Ethyl (B): <10 mg/dL (ref <10)

## 2017-11-10 MED ORDER — SERTRALINE HCL 50 MG PO TABS
150.0000 mg | ORAL_TABLET | Freq: Every day | ORAL | Status: DC
  Administered 2017-11-11 – 2017-11-15 (×5): 150 mg via ORAL
  Filled 2017-11-10 (×9): qty 1

## 2017-11-10 MED ORDER — CHLORDIAZEPOXIDE HCL 25 MG PO CAPS
25.0000 mg | ORAL_CAPSULE | Freq: Four times a day (QID) | ORAL | Status: DC | PRN
  Filled 2017-11-10: qty 1

## 2017-11-10 MED ORDER — LOPERAMIDE HCL 2 MG PO CAPS: 2.0000 mg | ORAL_CAPSULE | ORAL | Status: AC | PRN

## 2017-11-10 MED ORDER — ADULT MULTIVITAMIN W/MINERALS CH
1.0000 | ORAL_TABLET | Freq: Every day | ORAL | Status: DC
  Administered 2017-11-10 – 2017-11-15 (×6): 1 via ORAL
  Filled 2017-11-10 (×10): qty 1

## 2017-11-10 MED ORDER — ARIPIPRAZOLE 2 MG PO TABS
2.0000 mg | ORAL_TABLET | Freq: Every day | ORAL | Status: DC
  Administered 2017-11-10 – 2017-11-12 (×3): 2 mg via ORAL
  Filled 2017-11-10 (×6): qty 1

## 2017-11-10 MED ORDER — HYDROXYZINE HCL 25 MG PO TABS: 25.0000 mg | ORAL_TABLET | Freq: Four times a day (QID) | ORAL | Status: AC | PRN

## 2017-11-10 MED ORDER — SERTRALINE HCL 100 MG PO TABS
100.0000 mg | ORAL_TABLET | Freq: Every day | ORAL | Status: DC
  Filled 2017-11-10 (×2): qty 1

## 2017-11-10 MED ORDER — GABAPENTIN 100 MG PO CAPS
100.0000 mg | ORAL_CAPSULE | Freq: Three times a day (TID) | ORAL | Status: DC
  Administered 2017-11-10 – 2017-11-12 (×6): 100 mg via ORAL
  Filled 2017-11-10 (×12): qty 1

## 2017-11-10 MED ORDER — VITAMIN B-1 100 MG PO TABS
100.0000 mg | ORAL_TABLET | Freq: Every day | ORAL | Status: DC
  Administered 2017-11-11 – 2017-11-15 (×5): 100 mg via ORAL
  Filled 2017-11-10 (×9): qty 1

## 2017-11-10 MED ORDER — IBUPROFEN 600 MG PO TABS
600.0000 mg | ORAL_TABLET | Freq: Four times a day (QID) | ORAL | Status: DC | PRN
  Administered 2017-11-10 – 2017-11-15 (×5): 600 mg via ORAL
  Filled 2017-11-10 (×5): qty 1

## 2017-11-10 MED ORDER — ONDANSETRON 4 MG PO TBDP: 4.0000 mg | ORAL_TABLET | Freq: Four times a day (QID) | ORAL | Status: AC | PRN

## 2017-11-10 NOTE — Progress Notes (Signed)
D. Pt presents with an anxious affect and depressed mood. Per pt's self inventory, pt rates her depression, hopelessness and anxiety a 10/10/9, respectively.Pt endorses passive SI- reports she "sometimes" has thoughts- pt verbally contracts for safety. A. Labs and vitals monitored. Pt compliant with medications. Pt supported emotionally and encouraged to express concerns and ask questions.   R. Pt remains safe with 15 minute checks. Will continue POC.

## 2017-11-10 NOTE — BHH Group Notes (Signed)
LCSW Group Therapy Note  11/10/2017 9:30-10:30AM - 300 Hall, 10:30-11:30 - 400 Hall, 11:30-12:00 - 500 Hall  Type of Therapy and Topic:  Group Therapy: Anger Cues and Responses  Participation Level:  Did Not Attend   Description of Group:   In this group, patients learned how to recognize the physical, cognitive, emotional, and behavioral responses they have to anger-provoking situations.  They identified a recent time they became angry and how they reacted.  They analyzed how their reaction was possibly beneficial and how it was possibly unhelpful.  The group discussed a variety of healthier coping skills that could help with such a situation in the future.  Deep breathing was practiced briefly.  Therapeutic Goals: 1. Patients will remember their last incident of anger and how they felt emotionally and physically, what their thoughts were at the time, and how they behaved. 2. Patients will identify how their behavior at that time worked for them, as well as how it worked against them. 3. Patients will explore possible new behaviors to use in future anger situations. 4. Patients will learn that anger itself is normal and cannot be eliminated, and that healthier reactions can assist with resolving conflict rather than worsening situations.  Summary of Patient Progress:  N/A  Therapeutic Modalities:   Cognitive Behavioral Therapy  Ashley Stevens  11/10/2017 8:33 AM

## 2017-11-10 NOTE — BHH Counselor (Signed)
PSA attempted with patient, but she stated she was "too exhausted" and asked if it could wait until tomorrow.  CSW to follow up.  Selmer Dominion, LCSW 11/10/2017, 4:27 PM

## 2017-11-10 NOTE — Progress Notes (Signed)
D.  Pt has remained in bed since night shift started.  Pt did not get up for evening wrap up group.  Minimal interaction, Pt remains isolative.  A  Support and encouragement offered  R.  Pt remains safe on the unit, will continue to monitor.

## 2017-11-10 NOTE — Progress Notes (Signed)
D   Pt has been in bed resting with eyes closed     She has no complaints   She did not attend AA group this evening  A    Verbal support given    Medications administered and effectivness monitored    Q 15 min checks  R    Pt is safe at present time

## 2017-11-10 NOTE — BHH Suicide Risk Assessment (Signed)
[facility] Admission Suicide Risk Assessment   Nursing information obtained from:   patient and chart  Demographic factors:   42 year old female, has 3 children, two are minors and are with their father, unemployed  Current Mental Status:   see below Loss Factors:   substance abuse, unemployment  Historical Factors:   history of depression, history of cocaine use disorder  Risk Reduction Factors:   resilience   Total Time spent with patient: 45 minutes Principal Problem:  Depression, Cocaine Use Disorder  Diagnosis:   Patient Active Problem List   Diagnosis Date Noted  . MDD (major depressive disorder), recurrent severe, without psychosis ([place]) [F33.2] 11/09/2017  . Cocaine abuse with cocaine-induced mood disorder ([place]) [F14.14] 11/03/2016  . Cocaine use disorder, moderate, dependence ([place]) [F14.20] 05/07/2016  . Cannabis use disorder, moderate, dependence ([place]) [F12.20] 05/07/2016    Continued Clinical Symptoms:  Alcohol Use Disorder Identification Test Final Score (AUDIT): 1 The "Alcohol Use Disorders Identification Test", Guidelines for Use in Primary Care, Second Edition.  World Pharmacologist [facility]). Score between 0-7:  no or low risk or alcohol related problems. Score between 8-15:  moderate risk of alcohol related problems. Score between 16-19:  high risk of alcohol related problems. Score 20 or above:  warrants further diagnostic evaluation for alcohol dependence and treatment.   CLINICAL FACTORS:  Patient is a 42 year old female, single,unemployed, lives with fiance, has three children.  Presented to hospital voluntarily due to depression, suicidal ideations of cutting wrist . Reports recently worsening depression, denies hallucinations or psychotic symptoms.  States that she had a prior suicide attempt by overdosing a few weeks ago. Reports history of Cocaine Abuse, and has been using cocaine regularly, which she states is in part an effort to self treat depression. Most  recent UDS is from 10/12/17 and is positive for BZDs and Cocaine. BAL is negative   Prior to admission was on Zoloft 100 mgrs daily, Seroquel 300 mgrs QHS, Neurontin 300 mgrs TID, Xanax 1 mgr TID PRN for anxiety. Reports she was taking less Xanax than prescribed .  Patient has history of prior psychiatric admission. In 2017 was admitted due to worsening depression,cocaine abuse.  Has been diagnosed with MDD and with Cocaine Dependence in the past .  Patient reports history of Chronic Pain, related to DD and bursitis.  Dx- Cocaine Use Disorder, Cocaine Induced Mood Disorder versus MDD   Plan- Inpatient admission. We discussed options - she states Zoloft has helped partially, and denies side effects. Seroquel has been associated with significant weight gain so she is interested in stopping and switching to another medication. Increase Zoloft to 100 mgrs QDAY, D/C Seroquel , Start Abilify 2 mgrs QDAY as antidepressant augmentation. Librium PRNs for potential BZD WDL         Musculoskeletal: Strength & Muscle Tone: within normal limits Gait & Station: normal Patient leans: N/A  Psychiatric Specialty Exam: Physical Exam  ROS no chest pain, no shortness of breath, no vomiting   Blood pressure (!) 132/96, pulse 90, temperature 98.9 F (37.2 C), temperature source Oral, resp. rate 20, height 6\' 1"  (1.854 m), weight (!) 142.9 kg (315 lb).Body mass index is 41.56 kg/m.  General Appearance: Fairly Groomed  Eye Contact:  Good  Speech:  Normal Rate  Volume:  Decreased  Mood:  depressed, but states feeling better today  Affect:  Constricted and but smiles briefly at times   Thought Process:  Linear and Descriptions of Associations: Intact  Orientation:  Other:  fully alert and attentive   Thought Content:  denies hallucinations, no delusions, not internally preoccupied   Suicidal Thoughts:  No currently denies suicidal or self injurious ideations, and contracts for safety on unit    Homicidal Thoughts:  No denies homicidal or violent ideations  Memory:  recent and remote grossly intact   Judgement:  Fair  Insight:  Fair  Psychomotor Activity:  Normal- no tremors, no diaphoresis, no restlessness or psychomotor agitation  Concentration:  Concentration: Good and Attention Span: Good  Recall:  Good  Fund of Knowledge:  Good  Language:  Good  Akathisia:  Negative  Handed:  Right  AIMS (if indicated):     Assets:  Communication Skills Desire for Improvement Resilience  ADL's:  Intact  Cognition:  WNL  Sleep:  Number of Hours: 6.75      COGNITIVE FEATURES THAT CONTRIBUTE TO RISK:  Closed-mindedness and Loss of executive function    SUICIDE RISK:   Moderate:  Frequent suicidal ideation with limited intensity, and duration, some specificity in terms of plans, no associated intent, good self-control, limited dysphoria/symptomatology, some risk factors present, and identifiable protective factors, including available and accessible social support.  PLAN OF CARE: Patient will be admitted to inpatient psychiatric unit for stabilization and safety. Will provide and encourage milieu participation. Provide medication management and maked adjustments as needed. Will also provide medication to minimize possible risk of BZD withdrawal.  Will follow daily.    I certify that inpatient services furnished can reasonably be expected to improve the patient's condition.   Jenne Campus, MD 11/10/2017, 12:36 PM
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
## 2017-11-10 NOTE — BHH Group Notes (Signed)
[place] Group Notes:  (Nursing)  Date:  11/10/2017  Time:  1:15 PM Type of Therapy:  Nurse Education  Participation Level:  Did Not Attend  Participation Quality:  Did not attend  Affect:  Did not attend  Cognitive:  Did not attend  Insight:  None  Engagement in Group:  None  Modes of Intervention:  Discussion and Education  Summary of Progress/Problems:  Waymond Cera 11/10/2017, 3:26 PM

## 2017-11-11 DIAGNOSIS — F322 Major depressive disorder, single episode, severe without psychotic features: Secondary | ICD-10-CM

## 2017-11-11 MED ORDER — LIDOCAINE 5 % EX PTCH
1.0000 | MEDICATED_PATCH | Freq: Every day | CUTANEOUS | Status: DC
  Administered 2017-11-11 – 2017-11-15 (×5): 1 via TRANSDERMAL
  Filled 2017-11-11 (×9): qty 1

## 2017-11-11 NOTE — Progress Notes (Signed)
[facility] MD Progress Note  11/11/2017 11:41 AM Ashley Stevens  MRN:  [id number]   Subjective: Ashley Stevens reports " I am feeling okay today, I am just having back pain" reports history or herniated disc   Objective: Ashley Stevens is awake, alert and oriented. Patient reports lower back pain. States overall her mood is improving. States she still is experiencing a lot of depression.  Denies suicidal or homicidal ideation. Reports passive thought and feeling down and depressed.  Denies auditory or visual hallucination and does not appear to be responding to internal stimuli. Abilify was initiated reports she is tolerating medication well. Discussed medication adjustment to Abilify 5 mg starting tomorrow. Patient reports she is medication compliant without mediation side effects. Patient hasn't attended any group sessions however reports he will attempted at attend today. .  Reports good appetite and states she is resting well.. Support, encouragement and reassurance was provided.    Principal Problem: <principal problem not specified> Diagnosis:   Patient Active Problem List   Diagnosis Date Noted  . MDD (major depressive disorder), recurrent severe, without psychosis ([place]) [F33.2] 11/09/2017  . Cocaine abuse with cocaine-induced mood disorder ([place]) [F14.14] 11/03/2016  . Cocaine use disorder, moderate, dependence ([place]) [F14.20] 05/07/2016  . Cannabis use disorder, moderate, dependence ([facility]) [F12.20] 05/07/2016   Total Time spent with patient: 20 minutes  Past Psychiatric History:   Past Medical History:  Past Medical History:  Diagnosis Date  . Anxiety   . Depression   . Shingles   . Suicidal ideations     Past Surgical History:  Procedure Laterality Date  . c-section     Family History:  Family History  Problem Relation Age of Onset  . Heart failure Mother   . Kidney failure Brother   . Diabetes Other   . Hypertension Other    Family Psychiatric  History:  Social History:   Social History   Substance and Sexual Activity  Alcohol Use No     Social History   Substance and Sexual Activity  Drug Use Yes  . Types: Marijuana, Cocaine   Comment: Pt denies    Social History   Socioeconomic History  . Marital status: Legally Separated    Spouse name: None  . Number of children: None  . Years of education: None  . Highest education level: None  Social Needs  . Financial resource strain: None  . Food insecurity - worry: None  . Food insecurity - inability: None  . Transportation needs - medical: None  . Transportation needs - non-medical: None  Occupational History  . None  Tobacco Use  . Smoking status: Current Every Day Smoker    Packs/day: 1.00    Types: Cigarettes  . Smokeless tobacco: Never Used  Substance and Sexual Activity  . Alcohol use: No  . Drug use: Yes    Types: Marijuana, Cocaine    Comment: Pt denies  . Sexual activity: Yes    Birth control/protection: Condom  Other Topics Concern  . None  Social History Narrative  . None   Additional Social History:    Pain Medications: See MAR Prescriptions: See MAR Over the Counter: See MAR History of alcohol / drug use?: Yes Longest period of sobriety (when/how long): (P) 2 weeks Negative Consequences of Use: (P) Financial, Legal Name of Substance 1: Cocaine 1 - Age of First Use: Ukn 1 - Amount (size/oz): 1/2 gram to a gram 1 - Frequency: 3 -4 times a day 1 - Duration: Ongoing  for past year 1 - Last Use / Amount: Yesterday Name of Substance 2: Cannabis 2 - Age of First Use: UTA 2 - Amount (size/oz): blunt 2 - Frequency: Monthly 2 - Duration: Ongoing 2 - Last Use / Amount: few days ago                Sleep: Fair  Appetite:  Fair  Current Medications: Current Facility-Administered Medications  Medication Dose Route Frequency Provider Last Rate Last Dose  . acetaminophen (TYLENOL) tablet 650 mg  650 mg Oral Q6H PRN Rankin, Shuvon B, NP      . alum & mag  hydroxide-simeth (MAALOX/MYLANTA) 200-200-20 MG/5ML suspension 30 mL  30 mL Oral Q4H PRN Rankin, Shuvon B, NP      . ARIPiprazole (ABILIFY) tablet 2 mg  2 mg Oral Daily Cobos, Myer Peer, MD   2 mg at 11/11/17 0751  . chlordiazePOXIDE (LIBRIUM) capsule 25 mg  25 mg Oral Q6H PRN Cobos, Fernando A, MD      . gabapentin (NEURONTIN) capsule 100 mg  100 mg Oral TID Cobos, Myer Peer, MD   100 mg at 11/11/17 0751  . hydrOXYzine (ATARAX/VISTARIL) tablet 25 mg  25 mg Oral Q6H PRN Cobos, Myer Peer, MD      . ibuprofen (ADVIL,MOTRIN) tablet 600 mg  600 mg Oral Q6H PRN Derrill Center, NP   600 mg at 11/11/17 0750  . lidocaine (LIDODERM) 5 % 1 patch  1 patch Transdermal Daily Ricky Ala N, NP      . loperamide (IMODIUM) capsule 2-4 mg  2-4 mg Oral PRN Cobos, Myer Peer, MD      . magnesium hydroxide (MILK OF MAGNESIA) suspension 30 mL  30 mL Oral Daily PRN Rankin, Shuvon B, NP      . multivitamin with minerals tablet 1 tablet  1 tablet Oral Daily Cobos, Myer Peer, MD   1 tablet at 11/11/17 0750  . nicotine (NICODERM CQ - dosed in mg/24 hours) patch 14 mg  14 mg Transdermal Daily PRN Rankin, Shuvon B, NP      . ondansetron (ZOFRAN-ODT) disintegrating tablet 4 mg  4 mg Oral Q6H PRN Cobos, Fernando A, MD      . sertraline (ZOLOFT) tablet 150 mg  150 mg Oral Daily Cobos, Myer Peer, MD   150 mg at 11/11/17 0750  . thiamine (VITAMIN B-1) tablet 100 mg  100 mg Oral Daily Cobos, Myer Peer, MD   100 mg at 11/11/17 0751  . traZODone (DESYREL) tablet 50 mg  50 mg Oral QHS PRN Rankin, Shuvon B, NP        Lab Results:  Results for orders placed or performed during the hospital encounter of 11/09/17 (from the past 48 hour(s))  CBC     Status: None   Collection Time: 11/10/17  6:46 AM  Result Value Ref Range   WBC 8.9 4.0 - 10.5 K/uL   RBC 4.60 3.87 - 5.11 MIL/uL   Hemoglobin 12.2 12.0 - 15.0 g/dL   HCT 38.1 36.0 - 46.0 %   MCV 82.8 78.0 - 100.0 fL   MCH 26.5 26.0 - 34.0 pg   MCHC 32.0 30.0 - 36.0 g/dL    RDW 14.3 11.5 - 15.5 %   Platelets 307 150 - 400 K/uL    Comment: Performed at [facility], [place] [address], [place], [place] [address]  Comprehensive metabolic panel     Status: Abnormal   Collection Time: 11/10/17  6:46 AM  Result Value Ref Range   Sodium 138 135 - 145 mmol/L   Potassium 4.2 3.5 - 5.1 mmol/L   Chloride 108 101 - 111 mmol/L   CO2 26 22 - 32 mmol/L   Glucose, Bld 100 (H) 65 - 99 mg/dL   BUN 12 6 - 20 mg/dL   Creatinine, Ser 1.40 (H) 0.44 - 1.00 mg/dL   Calcium 8.8 (L) 8.9 - 10.3 mg/dL   Total Protein 6.6 6.5 - 8.1 g/dL   Albumin 3.2 (L) 3.5 - 5.0 g/dL   AST 17 15 - 41 U/L   ALT 8 (L) 14 - 54 U/L   Alkaline Phosphatase 81 38 - 126 U/L   Total Bilirubin 0.1 (L) 0.3 - 1.2 mg/dL   GFR calc non Af Amer 46 (L) >60 mL/min   GFR calc Af Amer 53 (L) >60 mL/min    Comment: (NOTE) The eGFR has been calculated using the CKD EPI equation. This calculation has not been validated in all clinical situations. eGFR's persistently <60 mL/min signify possible Chronic Kidney Disease.    Anion gap 4 (L) 5 - 15    Comment: Performed at [facility] [place] [address] [place], [place] [address]  Hemoglobin A1c     Status: Abnormal   Collection Time: 11/10/17  6:46 AM  Result Value Ref Range   Hgb A1c MFr Bld 5.9 (H) 4.8 - 5.6 %    Comment: (NOTE) Pre diabetes:          5.7%-6.4% Diabetes:              >6.4% Glycemic control for   <7.0% adults with diabetes    Mean Plasma Glucose 122.63 mg/dL    Comment: Performed at [facility] [address], [place], [place] [address]  Ethanol     Status: None   Collection Time: 11/10/17  6:46 AM  Result Value Ref Range   Alcohol, Ethyl (B) <10 <10 mg/dL    Comment:        LOWEST DETECTABLE LIMIT FOR SERUM ALCOHOL IS 10 mg/dL FOR MEDICAL PURPOSES ONLY Performed at [facility] [address] [place],  [address]   Lipid panel     Status: Abnormal   Collection Time:  11/10/17  6:46 AM  Result Value Ref Range   Cholesterol 202 (H) 0 - 200 mg/dL   Triglycerides 137 <150 mg/dL   HDL 51 >40 mg/dL   Total CHOL/HDL Ratio 4.0 RATIO   VLDL 27 0 - 40 mg/dL   LDL Cholesterol 124 (H) 0 - 99 mg/dL    Comment:        Total Cholesterol/HDL:CHD Risk Coronary Heart Disease Risk Table                     Men   Women  1/2 Average Risk   3.4   3.3  Average Risk       5.0   4.4  2 X Average Risk   9.6   7.1  3 X Average Risk  23.4   11.0        Use the calculated Patient Ratio above and the CHD Risk Table to determine the patient's CHD Risk.        ATP III CLASSIFICATION (LDL):  <100     mg/dL   Optimal  100-129  mg/dL   Near or Above  Optimal  130-159  mg/dL   Borderline  160-189  mg/dL   High  >190     mg/dL   Very High Performed at [facility] [address], [place], [place] [address]   TSH     Status: None   Collection Time: 11/10/17  6:46 AM  Result Value Ref Range   TSH 2.318 0.350 - 4.500 uIU/mL    Comment: Performed by a 3rd Generation assay with a functional sensitivity of <=0.01 uIU/mL. Performed at [facility], [place] [address], [place], [place] [address]     Blood Alcohol level:  Lab Results  Component Value Date   ETH <10 11/10/2017   ETH <10 33/54/5625    Metabolic Disorder Labs: Lab Results  Component Value Date   HGBA1C 5.9 (H) 11/10/2017   MPG 122.63 11/10/2017   No results found for: PROLACTIN Lab Results  Component Value Date   CHOL 202 (H) 11/10/2017   TRIG 137 11/10/2017   HDL 51 11/10/2017   CHOLHDL 4.0 11/10/2017   VLDL 27 11/10/2017   LDLCALC 124 (H) 11/10/2017    Physical Findings: AIMS: Facial and Oral Movements Muscles of Facial Expression: None, normal Lips and Perioral Area: None, normal Jaw: None, normal Tongue: None, normal,Extremity Movements Upper (arms, wrists, hands, fingers): None, normal Lower (legs, knees, ankles, toes): None, normal,  Trunk Movements Neck, shoulders, hips: None, normal, Overall Severity Severity of abnormal movements (highest score from questions above): None, normal Incapacitation due to abnormal movements: None, normal Patient's awareness of abnormal movements (rate only patient's report): No Awareness, Dental Status Current problems with teeth and/or dentures?: No Does patient usually wear dentures?: No  CIWA:  CIWA-Ar Total: 0 COWS:     Musculoskeletal: Strength & Muscle Tone: within normal limits Gait & Station: normal Patient leans: N/A  Psychiatric Specialty Exam: Physical Exam  Constitutional: She is oriented to person, place, and time. She appears well-developed.  Neurological: She is alert and oriented to person, place, and time.  Psychiatric: She has a normal mood and affect. Her behavior is normal.    Review of Systems  Musculoskeletal: Positive for back pain.  Psychiatric/Behavioral: Positive for depression and suicidal ideas. The patient is nervous/anxious and has insomnia.   All other systems reviewed and are negative.   Blood pressure 133/86, pulse (!) 109, temperature 98.6 F (37 C), temperature source Oral, resp. rate 16, height 6' 1"  (1.854 m), weight (!) 142.9 kg (315 lb).Body mass index is 41.56 kg/m.  General Appearance: Casual  Eye Contact:  Fair  Speech:  Clear and Coherent  Volume:  Normal  Mood:  Depressed  Affect:  Appropriate  Thought Process:  Coherent  Orientation:  Full (Time, Place, and Person)  Thought Content:  Hallucinations: None  Suicidal Thoughts:  Yes.  with intent/plan  Homicidal Thoughts:  No  Memory:  Immediate;   Fair Recent;   Fair Remote;   Fair  Judgement:  Fair  Insight:  Fair  Psychomotor Activity:  Normal  Concentration:  Concentration: Fair  Recall:  [employer] of Knowledge:  Fair  Language:  Fair  Akathisia:  No  Handed:  Right  AIMS (if indicated):     Assets:  Communication Skills Desire for Improvement Social Support   ADL's:  Intact  Cognition:  WNL  Sleep:  Number of Hours: 6.75    Treatment Plan Summary: Daily contact with patient to assess and evaluate symptoms and progress in treatment and Medication management  Continue with current treatment plan 11/11/2017 except where noted  Initiated LiDerma Pactch 5% for lower back pain  Continue with Abilify 5 mg, Neurontin 100 mg for Zoloft 150 mg  mood stabilization. Continue with Trazodone 50 mg for insomnia Started on CWIA/ Librium Protocol Will continue to monitor vitals ,medication compliance and treatment side effects while patient is here.  Reviewed labs ,BAL - , UDS - pos for cocaine, thc  CSW will start working on disposition.  Patient to participate in therapeutic milieu  Derrill Center, NP 11/11/2017, 11:41 AM   Agree with NP Progress Note

## 2017-11-11 NOTE — BHH Group Notes (Signed)
[place] Group Notes:  (Nursing/MHT/Case Management/Adjunct)  Date:  11/11/2017  Time:  1:55 PM  Type of Therapy:  Psychoeducational Skills  Participation Level:  Did Not Attend  Participation Quality:  Did not attend  Affect:  Did not attend  Cognitive:  Did not attend  Insight:  None  Engagement in Group:  Did not attend  Modes of Intervention:  Did not attend  Summary of Progress/Problems: Pt did not attend Psychoeducational group with topic healthy support systems.   Ashley Stevens 11/11/2017, 1:55 PM
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
## 2017-11-11 NOTE — BHH Group Notes (Signed)
[place] Group Notes: (Clinical Social Work)   11/11/2017      Type of Therapy:  Group Therapy   Participation Level:  Did Not Attend despite MHT prompting   Selmer Dominion, LCSW 11/11/2017, 1:01 PM

## 2017-11-11 NOTE — Progress Notes (Signed)
D   Pt in bed resting with no complaints   She endorses depression and anxiety  Pt has minimal interaction and did not go to group this evening  A   Verbal support given   Medications offered   Q 15 min checks R   Pt remains safe

## 2017-11-11 NOTE — Progress Notes (Signed)
D. Pt remains isolative, staying in bed for most of the am. Pt does report sleeping well last night, but endorses low energy and poor concentration. Per pt's self inventory, pt rates her depression, hopelessness and anxiety a 10/10/9, respectivey. Pt administered prn med for back pain, 9/10- reporting no relief from med. Heat pack given to pt for back. Pt endorses passive SI but verbally contracts for safety. Pt denies A/V hallucinations. A. Labs and vitals monitored. Pt compliant with medications. Pt supported emotionally and encouraged to express concerns and ask questions.   R. Pt remains safe with 15 minute checks. Will continue POC.

## 2017-11-11 NOTE — BHH Group Notes (Signed)
[place] Group Notes:  (Nursing/MHT/Case Management/Adjunct)  Date:  11/11/2017  Time:  11:50 AM  Type of Therapy:  Orientation/Goals group  Participation Level:  Did Not Attend  Participation Quality:  Did Not Attend  Affect:  Did Not Attend  Cognitive:  Did Not Attend  Insight:  None  Engagement in Group:  Did Not Attend  Modes of Intervention:  Did Not Attend  Summary of Progress/Problems: Pt did not attend patient self inventory group/orientation group.   Benancio Deeds Shanta 11/11/2017, 11:50 AM

## 2017-11-11 NOTE — Progress Notes (Signed)
Patient spent much of the day in bed. Pt reports back pain relief(DD) from lidocaine patch.Took a shower and went to dinner after some encouragement.

## 2017-11-12 MED ORDER — ARIPIPRAZOLE 5 MG PO TABS
5.0000 mg | ORAL_TABLET | Freq: Every day | ORAL | Status: DC
  Administered 2017-11-13 – 2017-11-15 (×3): 5 mg via ORAL
  Filled 2017-11-12 (×5): qty 1

## 2017-11-12 MED ORDER — GABAPENTIN 300 MG PO CAPS
300.0000 mg | ORAL_CAPSULE | Freq: Three times a day (TID) | ORAL | Status: DC
  Administered 2017-11-12 – 2017-11-14 (×7): 300 mg via ORAL
  Filled 2017-11-12 (×12): qty 1

## 2017-11-12 NOTE — Progress Notes (Signed)
[facility] has [facility] scheduled for Wed 2/20 at 8am (this is the first day that she is eligible to return to [facility]). Per her request, [facility] referral also faxed 11/12/2017 1:12 PM.  Maxie Better, MSW, LCSW Clinical Social Worker 11/12/2017 1:12 PM

## 2017-11-12 NOTE — Progress Notes (Signed)
UA container given to patient for labs.

## 2017-11-12 NOTE — Progress Notes (Signed)
Recreation Therapy Notes  Date: 11/12/17 Time: 0930 Location: [address] Dayroom  Group Topic: Stress Management  Goal Area(s) Addresses:  Patient will verbalize importance of using healthy stress management.  Patient will identify positive emotions associated with healthy stress management.   Intervention: Stress Management  Activity :  Meditation.  LRT introduced the stress management technique of meditation.  LRT played a meditation that allowed patients to focus on finding forgiveness for people who may have wronged them.  Education:  Stress Management, Discharge Planning.   Education Outcome: Acknowledges edcuation/In group clarification offered/Needs additional education  Clinical Observations/Feedback: Pt did not attend group.    Victorino Sparrow, LRT/CTRS         Ria Comment, Doreene Forrey A 11/12/2017 10:47 AM

## 2017-11-12 NOTE — Progress Notes (Signed)
D: Pt was in bed in her room upon initial approach.  Pt presents with depressed affect and mood.  She reports "I don't really feel good" but would not elaborate.  Pt reports she slept well last night.  Pt denies HI, denies hallucinations, reports back pain of 7/10.  She endorses SI without a plan and she verbally contracts for safety.  Minimal interaction with peers.  Did not attend evening group.  A: Introduced self to pt.  Actively listened to pt and offered support and encouragement.  PRN medication administered for pain.  Heat packs provided for pain.  Q15 minute safety checks maintained.  R: Pt is safe on the unit.  Pt has not provided urine specimen.  Pt is compliant with medication.  Pt verbally contracts for safety.  Will continue to monitor and assess.

## 2017-11-12 NOTE — BHH Group Notes (Signed)
[place] Group Notes:  (Nursing/MHT/Case Management/Adjunct)  Date:  11/12/2017  Time:  4:00 pm  Type of Therapy:  Psychoeducational Skills  Participation Level:  Did Not Attend  Participation Quality:    Affect:    Cognitive:    Insight:    Engagement in Group:    Modes of Intervention:    Summary of Progress/Problems:  Patient did not attend.  [name] 11/12/2017, 6:25 PM

## 2017-11-12 NOTE — Plan of Care (Signed)
Nurse discussed anxiety, depression, coping skills with patient. 

## 2017-11-12 NOTE — Plan of Care (Signed)
Nurse discussed depression, anxiety, coping skills with patient.  

## 2017-11-12 NOTE — Progress Notes (Signed)
[facility] MD Progress Note  11/12/2017 12:00 PM Ashley Stevens  MRN:  [id number]   Subjective: Describes history of chronic back pain, and states she feels Neurontin has helped partially. Had been taking Neurontin prior to admission. Reports ongoing depression and anxiety. States, however, that today she is" starting to feel  a little better and at least wanting to be out of bed more". Also reports her appetite is improving . Does report some intermittent passive SI , " like sometimes  feeling I would be better off if I died ", but denies any actual plan or intention of hurting self or of suicide and contracts for safety on unit.   Objective: Case discussed with treatment team and have met with patient . Staff reports patient continues to present depressed, endorsing sadness, depression, intermittent SI. At this time, as above,  reports  some improvement , states " I have been feeling sad,irritable, but today I am a little better, with more energy to get out of bed". Describes  intermittent passive SI, without any  plan or intention , and is able to contract for safety on unit at this time. She is future oriented . Reports chronic pain , mostly lower back pain, which she states has responded partially to Neurontin , which she had been taking prior to admission without side effects. Tolerating medications well, denies side effects and describes feeling hopeful that Zoloft titration and Abilify trial are going to help her feel better. No disruptive or agitated behaviors on unit, limited group participation.    Principal Problem: depression Diagnosis:   Patient Active Problem List   Diagnosis Date Noted  . MDD (major depressive disorder), recurrent severe, without psychosis ([place]) [F33.2] 11/09/2017  . Cocaine abuse with cocaine-induced mood disorder ([place]) [F14.14] 11/03/2016  . Cocaine use disorder, moderate, dependence ([place]) [F14.20] 05/07/2016  . Cannabis use disorder, moderate, dependence ([facility])  [F12.20] 05/07/2016   Total Time spent with patient: 20 minutes  Past Psychiatric History:   Past Medical History:  Past Medical History:  Diagnosis Date  . Anxiety   . Depression   . Shingles   . Suicidal ideations     Past Surgical History:  Procedure Laterality Date  . c-section     Family History:  Family History  Problem Relation Age of Onset  . Heart failure Mother   . Kidney failure Brother   . Diabetes Other   . Hypertension Other    Family Psychiatric  History:  Social History:  Social History   Substance and Sexual Activity  Alcohol Use No     Social History   Substance and Sexual Activity  Drug Use Yes  . Types: Marijuana, Cocaine   Comment: Pt denies    Social History   Socioeconomic History  . Marital status: Legally Separated    Spouse name: None  . Number of children: None  . Years of education: None  . Highest education level: None  Social Needs  . Financial resource strain: None  . Food insecurity - worry: None  . Food insecurity - inability: None  . Transportation needs - medical: None  . Transportation needs - non-medical: None  Occupational History  . None  Tobacco Use  . Smoking status: Current Every Day Smoker    Packs/day: 1.00    Types: Cigarettes  . Smokeless tobacco: Never Used  Substance and Sexual Activity  . Alcohol use: No  . Drug use: Yes    Types: Marijuana, Cocaine    Comment:  Pt denies  . Sexual activity: Yes    Birth control/protection: Condom  Other Topics Concern  . None  Social History Narrative  . None   Additional Social History:    Pain Medications: See MAR Prescriptions: See MAR Over the Counter: See MAR History of alcohol / drug use?: Yes Longest period of sobriety (when/how long): (P) 2 weeks Negative Consequences of Use: (P) Financial, Legal Name of Substance 1: Cocaine 1 - Age of First Use: Ukn 1 - Amount (size/oz): 1/2 gram to a gram 1 - Frequency: 3 -4 times a day 1 - Duration: Ongoing  for past year 1 - Last Use / Amount: Yesterday Name of Substance 2: Cannabis 2 - Age of First Use: UTA 2 - Amount (size/oz): blunt 2 - Frequency: Monthly 2 - Duration: Ongoing 2 - Last Use / Amount: few days ago  Sleep: Fair  Appetite:  improving   Current Medications: Current Facility-Administered Medications  Medication Dose Route Frequency Provider Last Rate Last Dose  . acetaminophen (TYLENOL) tablet 650 mg  650 mg Oral Q6H PRN Rankin, Shuvon B, NP      . alum & mag hydroxide-simeth (MAALOX/MYLANTA) 200-200-20 MG/5ML suspension 30 mL  30 mL Oral Q4H PRN Rankin, Shuvon B, NP      . ARIPiprazole (ABILIFY) tablet 2 mg  2 mg Oral Daily Cobos, Myer Peer, MD   2 mg at 11/12/17 0811  . chlordiazePOXIDE (LIBRIUM) capsule 25 mg  25 mg Oral Q6H PRN Cobos, Fernando A, MD      . gabapentin (NEURONTIN) capsule 100 mg  100 mg Oral TID Cobos, Myer Peer, MD   100 mg at 11/12/17 1127  . hydrOXYzine (ATARAX/VISTARIL) tablet 25 mg  25 mg Oral Q6H PRN Cobos, Myer Peer, MD      . ibuprofen (ADVIL,MOTRIN) tablet 600 mg  600 mg Oral Q6H PRN Derrill Center, NP   600 mg at 11/11/17 1814  . lidocaine (LIDODERM) 5 % 1 patch  1 patch Transdermal Daily Derrill Center, NP   1 patch at 11/12/17 [phone]  . loperamide (IMODIUM) capsule 2-4 mg  2-4 mg Oral PRN Cobos, Myer Peer, MD      . magnesium hydroxide (MILK OF MAGNESIA) suspension 30 mL  30 mL Oral Daily PRN Rankin, Shuvon B, NP      . multivitamin with minerals tablet 1 tablet  1 tablet Oral Daily Cobos, Myer Peer, MD   1 tablet at 11/12/17 0814  . nicotine (NICODERM CQ - dosed in mg/24 hours) patch 14 mg  14 mg Transdermal Daily PRN Rankin, Shuvon B, NP      . ondansetron (ZOFRAN-ODT) disintegrating tablet 4 mg  4 mg Oral Q6H PRN Cobos, Fernando A, MD      . sertraline (ZOLOFT) tablet 150 mg  150 mg Oral Daily Cobos, Myer Peer, MD   150 mg at 11/12/17 0816  . thiamine (VITAMIN B-1) tablet 100 mg  100 mg Oral Daily Cobos, Myer Peer, MD   100 mg at  11/12/17 0814  . traZODone (DESYREL) tablet 50 mg  50 mg Oral QHS PRN Rankin, Shuvon B, NP        Lab Results:  No results found for this or any previous visit (from the past 48 hour(s)).  Blood Alcohol level:  Lab Results  Component Value Date   [facility] <10 11/10/2017   ETH <10 56/38/9373    Metabolic Disorder Labs: Lab Results  Component Value Date   HGBA1C 5.9 (H) 11/10/2017  MPG 122.63 11/10/2017   No results found for: PROLACTIN Lab Results  Component Value Date   CHOL 202 (H) 11/10/2017   TRIG 137 11/10/2017   HDL 51 11/10/2017   CHOLHDL 4.0 11/10/2017   VLDL 27 11/10/2017   LDLCALC 124 (H) 11/10/2017    Physical Findings: AIMS: Facial and Oral Movements Muscles of Facial Expression: None, normal Lips and Perioral Area: None, normal Jaw: None, normal Tongue: None, normal,Extremity Movements Upper (arms, wrists, hands, fingers): None, normal Lower (legs, knees, ankles, toes): None, normal, Trunk Movements Neck, shoulders, hips: None, normal, Overall Severity Severity of abnormal movements (highest score from questions above): None, normal Incapacitation due to abnormal movements: None, normal Patient's awareness of abnormal movements (rate only patient's report): No Awareness, Dental Status Current problems with teeth and/or dentures?: No Does patient usually wear dentures?: No  CIWA:  CIWA-Ar Total: 4 COWS:  COWS Total Score: 1  Musculoskeletal: Strength & Muscle Tone: within normal limits Gait & Station: normal Patient leans: N/A  Psychiatric Specialty Exam: Physical Exam  Constitutional: She is oriented to person, place, and time. She appears well-developed.  Neurological: She is alert and oriented to person, place, and time.  Psychiatric: She has a normal mood and affect. Her behavior is normal.    Review of Systems  Musculoskeletal: Positive for back pain.  Psychiatric/Behavioral: Positive for depression and suicidal ideas. The patient is  nervous/anxious and has insomnia.   All other systems reviewed and are negative. denies chest pain, no shortness of breath, no vomiting , no fever  Blood pressure (!) 142/91, pulse (!) 101, temperature 98.7 F (37.1 C), temperature source Oral, resp. rate 16, height 6' 1"  (1.854 m), weight (!) 142.9 kg (315 lb).Body mass index is 41.56 kg/m.  General Appearance: Fairly Groomed  Eye Contact:  Good  Speech:  Normal Rate  Volume:  Normal  Mood:  reports partially improved mood   Affect:  mildly constricted but reactive   Thought Process:  Linear and Descriptions of Associations: Intact  Orientation:  Other:  fully alert and attentive   Thought Content:  no hallucinations, no delusions, not internally preoccupied   Suicidal Thoughts:  intermittent passive SI, but denies suicidal plan or intention and contracts for safety on unit   Homicidal Thoughts:  No denies homicidal or violent ideations   Memory:  recent and remote grossly intact   Judgement:  Fair- improving   Insight:  Fair- improving   Psychomotor Activity:  Normal  Concentration:  Concentration: Good and Attention Span: Good  Recall:  Good  Fund of Knowledge:  Good  Language:  Good  Akathisia:  No  Handed:  Right  AIMS (if indicated):     Assets:  Communication Skills Desire for Improvement Social Support  ADL's:  Intact  Cognition:  WNL  Sleep:  Number of Hours: 4.75   Assessment - patient reports ongoing depression, fair sleep,intermittent passive SI ( denies active SI and contracts for safety on unit)but states that she is starting to feel better and that today she has spent more time out of bed and in milieu, that her appetite is improving . Reports ongoing chronic lower back pain, which she takes Neurontin for , without side effects.  Treatment Plan Summary: Daily contact with patient to assess and evaluate symptoms and progress in treatment and Medication management  Treatment Plan reviewed as below today 2/11   Encourage milieu and group participation to work on coping skills and symptom reduction Encourage efforts to work on  abstinence, sobriety Continue  LiDerma Pactch 5% for lower back pain  Increase Neurontin to 300 mgrs TID for back pain, anxiety Increase Abilify to 5 mgrs QDAY for mood and as antidepressant augmentation strategy- side effects discussed  Continue  Zoloft 150 mg  QDAY for depression, anxiety Continue with Trazodone 50 mg QHS for insomnia as needed  Treatment team working on disposition planning options   Jenne Campus, MD 11/12/2017, 12:00 PM   Patient ID: Ashley Stevens, female   DOB: [birth year], 42 y.o.   MRN: [id number]

## 2017-11-12 NOTE — Tx Team (Signed)
Interdisciplinary Treatment and Diagnostic Plan Update  11/12/2017 Time of Session: 0830AM Ashley Stevens MRN: [id number]  Principal Diagnosis: MDD, recurrent, severe, without psychosis  Secondary Diagnoses: Active Problems:   MDD (major depressive disorder), recurrent severe, without psychosis ([place])   Current Medications:  Current Facility-Administered Medications  Medication Dose Route Frequency Provider Last Rate Last Dose  . acetaminophen (TYLENOL) tablet 650 mg  650 mg Oral Q6H PRN Rankin, Shuvon B, NP      . alum & mag hydroxide-simeth (MAALOX/MYLANTA) 200-200-20 MG/5ML suspension 30 mL  30 mL Oral Q4H PRN Rankin, Shuvon B, NP      . ARIPiprazole (ABILIFY) tablet 2 mg  2 mg Oral Daily Cobos, Myer Peer, MD   2 mg at 11/12/17 0811  . chlordiazePOXIDE (LIBRIUM) capsule 25 mg  25 mg Oral Q6H PRN Cobos, Fernando A, MD      . gabapentin (NEURONTIN) capsule 100 mg  100 mg Oral TID Cobos, Myer Peer, MD   100 mg at 11/12/17 8185  . hydrOXYzine (ATARAX/VISTARIL) tablet 25 mg  25 mg Oral Q6H PRN Cobos, Myer Peer, MD      . ibuprofen (ADVIL,MOTRIN) tablet 600 mg  600 mg Oral Q6H PRN Derrill Center, NP   600 mg at 11/11/17 1814  . lidocaine (LIDODERM) 5 % 1 patch  1 patch Transdermal Daily Derrill Center, NP   1 patch at 11/12/17 [phone]  . loperamide (IMODIUM) capsule 2-4 mg  2-4 mg Oral PRN Cobos, Myer Peer, MD      . magnesium hydroxide (MILK OF MAGNESIA) suspension 30 mL  30 mL Oral Daily PRN Rankin, Shuvon B, NP      . multivitamin with minerals tablet 1 tablet  1 tablet Oral Daily Cobos, Myer Peer, MD   1 tablet at 11/12/17 0814  . nicotine (NICODERM CQ - dosed in mg/24 hours) patch 14 mg  14 mg Transdermal Daily PRN Rankin, Shuvon B, NP      . ondansetron (ZOFRAN-ODT) disintegrating tablet 4 mg  4 mg Oral Q6H PRN Cobos, Fernando A, MD      . sertraline (ZOLOFT) tablet 150 mg  150 mg Oral Daily Cobos, Myer Peer, MD   150 mg at 11/12/17 0816  . thiamine (VITAMIN B-1) tablet 100 mg   100 mg Oral Daily Cobos, Myer Peer, MD   100 mg at 11/12/17 0814  . traZODone (DESYREL) tablet 50 mg  50 mg Oral QHS PRN Rankin, Shuvon B, NP       PTA Medications: Medications Prior to Admission  Medication Sig Dispense Refill Last Dose  . ALPRAZolam (XANAX) 1 MG tablet Take 1 mg by mouth 3 (three) times daily as needed for anxiety.   Past Week at Unknown time  . gabapentin (NEURONTIN) 300 MG capsule Take 300 mg by mouth 3 (three) times daily.   Past Week at Unknown time  . oxyCODONE (OXY IR/ROXICODONE) 5 MG immediate release tablet Take 10 mg by mouth 3 (three) times daily as needed for severe pain.   Past Week at Unknown time  . QUEtiapine (SEROQUEL) 300 MG tablet Take 300 mg by mouth at bedtime.   Past Week at Unknown time  . sertraline (ZOLOFT) 100 MG tablet Take 100 mg by mouth daily.  5 03/12/2017 at Unknown time    Patient Stressors: Health problems Marital or family conflict Medication change or noncompliance Substance abuse  Patient Strengths: Ability for insight Average or above average intelligence Capable of independent living Communication skills General fund  of knowledge  Treatment Modalities: Medication Management, Group therapy, Case management,  1 to 1 session with clinician, Psychoeducation, Recreational therapy.   Physician Treatment Plan for Primary Diagnosis: MDD, recurrent, severe, without psychosis Long Term Goal(s): Improvement in symptoms so as ready for discharge Improvement in symptoms so as ready for discharge   Short Term Goals: Ability to identify changes in lifestyle to reduce recurrence of condition will improve Ability to verbalize feelings will improve Ability to demonstrate self-control will improve Ability to identify changes in lifestyle to reduce recurrence of condition will improve Ability to disclose and discuss suicidal ideas Compliance with prescribed medications will improve  Medication Management: Evaluate patient's response, side  effects, and tolerance of medication regimen.  Therapeutic Interventions: 1 to 1 sessions, Unit Group sessions and Medication administration.  Evaluation of Outcomes: Not Met  Physician Treatment Plan for Secondary Diagnosis: Active Problems:   MDD (major depressive disorder), recurrent severe, without psychosis ([place])  Long Term Goal(s): Improvement in symptoms so as ready for discharge Improvement in symptoms so as ready for discharge   Short Term Goals: Ability to identify changes in lifestyle to reduce recurrence of condition will improve Ability to verbalize feelings will improve Ability to demonstrate self-control will improve Ability to identify changes in lifestyle to reduce recurrence of condition will improve Ability to disclose and discuss suicidal ideas Compliance with prescribed medications will improve     Medication Management: Evaluate patient's response, side effects, and tolerance of medication regimen.  Therapeutic Interventions: 1 to 1 sessions, Unit Group sessions and Medication administration.  Evaluation of Outcomes: Not Met   RN Treatment Plan for Primary Diagnosis: MDD, recurrent, severe, without psychosis Long Term Goal(s): Knowledge of disease and therapeutic regimen to maintain health will improve  Short Term Goals: Ability to remain free from injury will improve, Ability to disclose and discuss suicidal ideas and Ability to identify and develop effective coping behaviors will improve  Medication Management: RN will administer medications as ordered by provider, will assess and evaluate patient's response and provide education to patient for prescribed medication. RN will report any adverse and/or side effects to prescribing provider.  Therapeutic Interventions: 1 on 1 counseling sessions, Psychoeducation, Medication administration, Evaluate responses to treatment, Monitor vital signs and CBGs as ordered, Perform/monitor CIWA, COWS, AIMS and Fall Risk  screenings as ordered, Perform wound care treatments as ordered.  Evaluation of Outcomes: Progressing   LCSW Treatment Plan for Primary Diagnosis: MDD, recurrent, severe, without psychosis Long Term Goal(s): Safe transition to appropriate next level of care at discharge, Engage patient in therapeutic group addressing interpersonal concerns.  Short Term Goals: Engage patient in aftercare planning with referrals and resources, Facilitate patient progression through stages of change regarding substance use diagnoses and concerns and Identify triggers associated with mental health/substance abuse issues  Therapeutic Interventions: Assess for all discharge needs, 1 to 1 time with Social worker, Explore available resources and support systems, Assess for adequacy in community support network, Educate family and significant other(s) on suicide prevention, Complete Psychosocial Assessment, Interpersonal group therapy.  Evaluation of Outcomes: Progressing   Progress in Treatment: Attending groups: Yes. Participating in groups: Yes. Taking medication as prescribed: Yes. Toleration medication: Yes. Family/Significant other contact made: No, will contact:  pt's fiance for collateral/to complete SPE. Patient understands diagnosis: Yes. Discussing patient identified problems/goals with staff: Yes. Medical problems stabilized or resolved: Yes. Denies suicidal/homicidal ideation: Yes. Issues/concerns per patient self-inventory: No. Other: n/a  New problem(s) identified: No, Describe:  n/a  New Short Term/Long  Term Goal(s): detox, medication management for mood stabilization; elimination of SI thoughts; development of comprehensive mental wellness/sobriety plan.   Patient Goal: "to get sober and get into treatment."  Discharge Plan or Barriers: CSW assessing. Pt was discharged from [facility] on 08/20/17 after four days in treatment. She is eligible for treatment at [facility] on 2/20-screening scheduled.  [facility] referral also made. [facility] [place] assessment made by patient prior to hospital admission.   Reason for Continuation of Hospitalization: Anxiety Depression Medication stabilization Suicidal ideation Withdrawal symptoms  Estimated Length of Stay: Thursday, 11/15/17  Attendees: Patient: Ashley Stevens  11/12/2017 10:26 AM  Physician:  Dr. Parke Poisson MD; Dr. Nancy Fetter MD 11/12/2017 10:26 AM  Nursing: August Albino RN 11/12/2017 10:26 AM  RN Care Manager:x 11/12/2017 10:26 AM  Social Worker: Press photographer, LCSW 11/12/2017 10:26 AM  Recreational Therapist: x 11/12/2017 10:26 AM  Other: Lindell Spar NP 11/12/2017 10:26 AM  Other:  11/12/2017 10:26 AM  Other: 11/12/2017 10:26 AM    Scribe for Treatment Team: Kimber Relic Smart, LCSW 11/12/2017 10:26 AM

## 2017-11-12 NOTE — Progress Notes (Signed)
D:  Patient's self inventory sheet, patient has fair sleep, no sleep medication given.  Poor appetite, low energy level, poor concentration.  Rated depression and hopeless 9, anxiety 10.  Withdrawals, cravings, agitation, irritability.  SI sometimes, contracts for safety, no plan.  Physical pain, back. A:  Medications administered per MD orders.  Emotional support and encouragement given patient. R: Patient stated she is "SI, that's why I came in here."  Denied HI.  Denied A/V hallucinations.  Safety maintained with 15 minute checks.

## 2017-11-13 LAB — URINALYSIS, COMPLETE (UACMP) WITH MICROSCOPIC
BACTERIA UA: NONE SEEN
Bilirubin Urine: NEGATIVE
GLUCOSE, UA: NEGATIVE mg/dL
HGB URINE DIPSTICK: NEGATIVE
Ketones, ur: 5 mg/dL — AB
LEUKOCYTES UA: NEGATIVE
NITRITE: NEGATIVE
PH: 5 (ref 5.0–8.0)
PROTEIN: NEGATIVE mg/dL
RBC / HPF: NONE SEEN RBC/hpf (ref 0–5)
Specific Gravity, Urine: 1.021 (ref 1.005–1.030)

## 2017-11-13 LAB — RAPID URINE DRUG SCREEN, HOSP PERFORMED
AMPHETAMINES: NOT DETECTED
BARBITURATES: NOT DETECTED
BENZODIAZEPINES: NOT DETECTED
COCAINE: NOT DETECTED
Opiates: NOT DETECTED
TETRAHYDROCANNABINOL: NOT DETECTED

## 2017-11-13 LAB — PREGNANCY, URINE: Preg Test, Ur: NEGATIVE

## 2017-11-13 NOTE — Progress Notes (Signed)
Recreation Therapy Notes  Animal-Assisted Activity (AAA) Program Checklist/Progress Notes Patient Eligibility Criteria Checklist & Daily Group note for Rec TxIntervention  Date: 2.12.19 Time: 2:45 pm  Location: [address]   AAA/T Program Assumption of Risk Form signed by Patient/ or Parent Legal Guardian Yes  Patient is free of allergies or sever asthma Yes  Patient reports no fear of animals Yes  Patient reports no history of cruelty to animals Yes  Patient understands his/her participation is voluntary Yes  Behavioral Response: Did not attend   Ranell Patrick, Recreation Therapy Intern  Ranell Patrick 11/13/2017 8:37 AM

## 2017-11-13 NOTE — [facility] Group Notes (Signed)
Adult Psychoeducational Group Note  Date:  11/13/2017 Time:  7:16 PM  Group Topic/Focus:  Activity  Participation Level:  Did Not Attend    Levora Dredge 11/13/2017, 7:16 PM

## 2017-11-13 NOTE — [facility] Group Notes (Signed)
[facility] Group Therapy 11/13/2017 1:15pm  Type of Therapy: Mental Health Association Presentation  Participation Level: DID NOT ATTEND. Invited. Chose to remain in bed.   Summary of Progress/Problems: [facility]) Speaker came to talk about his personal journey with mental health. The pt processed ways by which to relate to the speaker. [place] speaker provided handouts and educational information pertaining to groups and services offered by the [facility]. Pt was engaged in speaker's presentation and was receptive to resources provided.    [employer], LCSW 11/13/2017 3:53 PM

## 2017-11-13 NOTE — Progress Notes (Signed)
[facility] MD Progress Note  11/13/2017 1:38 PM [name]  MRN:  [id number]   Subjective:  Patient reports that she is doing better today. She denies any SI/HI/AVH and contracts for safety. She is asking about Naltrexone for cravings of cocaine and is stating that she has had a 30 lb. Weight gain in 2.5 weeks due to eating when she has cravings. She denies any withdrawal symptoms. She denies any medication side effects.   Objective: Patient's chart and findings reviewed and discussed with treatment team. Patient presents in her bed and is pleasant and cooperative. She will continue current medication regimen since Zoloft was just increased. Will stop the Librium Detox as she denies any use of benzodiazipines or alcohol, only cocaine.   Principal Problem: MDD (major depressive disorder), recurrent severe, without psychosis ([place]) Diagnosis:   Patient Active Problem List   Diagnosis Date Noted  . MDD (major depressive disorder), recurrent severe, without psychosis ([place]) [F33.2] 11/09/2017  . Cocaine abuse with cocaine-induced mood disorder ([place]) [F14.14] 11/03/2016  . Cocaine use disorder, moderate, dependence ([place]) [F14.20] 05/07/2016  . Cannabis use disorder, moderate, dependence ([facility]) [F12.20] 05/07/2016   Total Time spent with patient: 15 minutes  Past Psychiatric History: See H&P  Past Medical History:  Past Medical History:  Diagnosis Date  . Anxiety   . Depression   . Shingles   . Suicidal ideations     Past Surgical History:  Procedure Laterality Date  . c-section     Family History:  Family History  Problem Relation Age of Onset  . Heart failure Mother   . Kidney failure Brother   . Diabetes Other   . Hypertension Other    Family Psychiatric  History: See H&P Social History:  Social History   Substance and Sexual Activity  Alcohol Use No     Social History   Substance and Sexual Activity  Drug Use Yes  . Types: Marijuana, Cocaine   Comment: Pt denies     Social History   Socioeconomic History  . Marital status: Legally Separated    Spouse name: None  . Number of children: None  . Years of education: None  . Highest education level: None  Social Needs  . Financial resource strain: None  . Food insecurity - worry: None  . Food insecurity - inability: None  . Transportation needs - medical: None  . Transportation needs - non-medical: None  Occupational History  . None  Tobacco Use  . Smoking status: Current Every Day Smoker    Packs/day: 1.00    Types: Cigarettes  . Smokeless tobacco: Never Used  Substance and Sexual Activity  . Alcohol use: No  . Drug use: Yes    Types: Marijuana, Cocaine    Comment: Pt denies  . Sexual activity: Yes    Birth control/protection: Condom  Other Topics Concern  . None  Social History Narrative  . None   Additional Social History:    Pain Medications: See MAR Prescriptions: See MAR Over the Counter: See MAR History of alcohol / drug use?: Yes Longest period of sobriety (when/how long): (P) 2 weeks Negative Consequences of Use: (P) Financial, Legal Name of Substance 1: Cocaine 1 - Age of First Use: Ukn 1 - Amount (size/oz): 1/2 gram to a gram 1 - Frequency: 3 -4 times a day 1 - Duration: Ongoing for past year 1 - Last Use / Amount: Yesterday Name of Substance 2: Cannabis 2 - Age of First Use: UTA 2 -  Amount (size/oz): blunt 2 - Frequency: Monthly 2 - Duration: Ongoing 2 - Last Use / Amount: few days ago                Sleep: Good  Appetite:  Good  Current Medications: Current Facility-Administered Medications  Medication Dose Route Frequency Provider Last Rate Last Dose  . acetaminophen (TYLENOL) tablet 650 mg  650 mg Oral Q6H PRN Rankin, Shuvon B, NP      . alum & mag hydroxide-simeth (MAALOX/MYLANTA) 200-200-20 MG/5ML suspension 30 mL  30 mL Oral Q4H PRN Rankin, Shuvon B, NP      . ARIPiprazole (ABILIFY) tablet 5 mg  5 mg Oral Daily Cobos, Myer Peer, MD   5 mg at  11/13/17 0837  . gabapentin (NEURONTIN) capsule 300 mg  300 mg Oral TID Cobos, Myer Peer, MD   300 mg at 11/13/17 1202  . ibuprofen (ADVIL,MOTRIN) tablet 600 mg  600 mg Oral Q6H PRN Derrill Center, NP   600 mg at 11/12/17 2029  . lidocaine (LIDODERM) 5 % 1 patch  1 patch Transdermal Daily Derrill Center, NP   1 patch at 11/13/17 [phone]  . magnesium hydroxide (MILK OF MAGNESIA) suspension 30 mL  30 mL Oral Daily PRN Rankin, Shuvon B, NP      . multivitamin with minerals tablet 1 tablet  1 tablet Oral Daily Cobos, Myer Peer, MD   1 tablet at 11/13/17 0839  . nicotine (NICODERM CQ - dosed in mg/24 hours) patch 14 mg  14 mg Transdermal Daily PRN Rankin, Shuvon B, NP      . sertraline (ZOLOFT) tablet 150 mg  150 mg Oral Daily Cobos, Myer Peer, MD   150 mg at 11/13/17 0839  . thiamine (VITAMIN B-1) tablet 100 mg  100 mg Oral Daily Cobos, Myer Peer, MD   100 mg at 11/13/17 0839  . traZODone (DESYREL) tablet 50 mg  50 mg Oral QHS PRN Rankin, Shuvon B, NP        Lab Results: No results found for this or any previous visit (from the past 48 hour(s)).  Blood Alcohol level:  Lab Results  Component Value Date   ETH <10 11/10/2017   ETH <10 61/60/7371    Metabolic Disorder Labs: Lab Results  Component Value Date   HGBA1C 5.9 (H) 11/10/2017   MPG 122.63 11/10/2017   No results found for: PROLACTIN Lab Results  Component Value Date   CHOL 202 (H) 11/10/2017   TRIG 137 11/10/2017   HDL 51 11/10/2017   CHOLHDL 4.0 11/10/2017   VLDL 27 11/10/2017   LDLCALC 124 (H) 11/10/2017    Physical Findings: AIMS: Facial and Oral Movements Muscles of Facial Expression: None, normal Lips and Perioral Area: None, normal Jaw: None, normal Tongue: None, normal,Extremity Movements Upper (arms, wrists, hands, fingers): None, normal Lower (legs, knees, ankles, toes): None, normal, Trunk Movements Neck, shoulders, hips: None, normal, Overall Severity Severity of abnormal movements (highest score from  questions above): None, normal Incapacitation due to abnormal movements: None, normal Patient's awareness of abnormal movements (rate only patient's report): No Awareness, Dental Status Current problems with teeth and/or dentures?: No Does patient usually wear dentures?: No  CIWA:  CIWA-Ar Total: 1 COWS:  COWS Total Score: 2  Musculoskeletal: Strength & Muscle Tone: within normal limits Gait & Station: normal Patient leans: N/A  Psychiatric Specialty Exam: Physical Exam  Nursing note and vitals reviewed. Constitutional: She is oriented to person, place, and time. She appears well-developed and  well-nourished.  Cardiovascular: Normal rate.  Respiratory: Effort normal.  Musculoskeletal: Normal range of motion.  Neurological: She is alert and oriented to person, place, and time.  Skin: Skin is warm.    Review of Systems  Constitutional: Negative.   HENT: Negative.   Eyes: Negative.   Respiratory: Negative.   Cardiovascular: Negative.   Gastrointestinal: Negative.   Genitourinary: Negative.   Musculoskeletal: Negative.   Skin: Negative.   Neurological: Negative.   Endo/Heme/Allergies: Negative.   Psychiatric/Behavioral: Positive for depression. Negative for hallucinations and suicidal ideas. The patient is nervous/anxious.     Blood pressure 130/77, pulse 97, temperature 99.5 F (37.5 C), resp. rate 16, height 6\' 1"  (1.854 m), weight (!) 142.9 kg (315 lb).Body mass index is 41.56 kg/m.  General Appearance: Casual  Eye Contact:  Good  Speech:  Clear and Coherent and Normal Rate  Volume:  Normal  Mood:  Anxious and Depressed  Affect:  Congruent  Thought Process:  Goal Directed and Descriptions of Associations: Intact  Orientation:  Full (Time, Place, and Person)  Thought Content:  WDL  Suicidal Thoughts:  No  Homicidal Thoughts:  No  Memory:  Immediate;   Good Recent;   Good Remote;   Good  Judgement:  Good  Insight:  Good  Psychomotor Activity:  Normal   Concentration:  Concentration: Good and Attention Span: Good  Recall:  Good  Fund of Knowledge:  Good  Language:  Good  Akathisia:  No  Handed:  Right  AIMS (if indicated):     Assets:  Communication Skills Desire for Improvement Financial Resources/Insurance Housing Physical Health Social Support Transportation  ADL's:  Intact  Cognition:  WNL  Sleep:  Number of Hours: 4.25   Problems Addressed: MDD severe Cocaine use disorder  Treatment Plan Summary: Daily contact with patient to assess and evaluate symptoms and progress in treatment, Medication management and Plan is to:  -Continue Abilify 5 mg PO Daily for mood stability -Continue Neurontin 300 mg PO TID for neuropathic pain -Stop Librium PRN Detox Protocol -Continue Zoloft 150 mg PO Daily for mood stability -Encourage group therapy participation  Lewis Shock, FNP 11/13/2017, 1:38 PM

## 2017-11-13 NOTE — BHH Group Notes (Signed)
[facility] Group Notes:  (Nursing/MHT/Case Management/Adjunct)  Date:  11/13/2017  Time:  3:30 p.m.  Type of Therapy:  Psychoeducational Skills  Participation Level:  Did Not Attend  Participation Quality:    Affect:    Cognitive:    Insight:    Engagement in Group:    Modes of Intervention:    Summary of Progress/Problems:  Ashley Stevens 11/13/2017, 5:42 PM

## 2017-11-13 NOTE — Progress Notes (Signed)
D: Pt was in her room with visitor upon initial approach.  Pt presents with appropriate affect and depressed mood.  She reports she had a good visit with her husband tonight.  Goal was to "make a group and I did."  Pt denies SI/HI, denies hallucinations, denies pain.  Pt reports she may be going to [facility] after she leaves [facility] and she plans to find out more about the program there tomorrow.  Pt has been more visible in milieu tonight compared to last night.  She did not attend evening group.     A: Introduced self to pt.  Actively listened to pt and offered support and encouragement.   Q15 minute safety checks maintained.  R: Pt is safe on the unit.  Pt verbally contracts for safety.  Will continue to monitor and assess.

## 2017-11-13 NOTE — Plan of Care (Signed)
Nurse discussed depression, anxiety, coping skills with patient.  

## 2017-11-13 NOTE — Plan of Care (Signed)
  Progressing Safety: Periods of time without injury will increase 11/13/2017 0254 - Progressing by Karie Kirks, RN Note Pt has not harmed self or others tonight.  She denies SI/HI and verbally contracts for safety.

## 2017-11-13 NOTE — Progress Notes (Signed)
D:  Patient's self inventory sheet, patient stated she has fair sleep, no sleep medication given.  Fair appetite, low energy level, poor concentration.  Rated depression, hopeless and anxiety 8.  Withdrawals, cravings, agitation, irritability.  Denied SI.  Physical problems, pain, headaches.  Physical pain, back, left ankle, worst pain in past 24 hours #9.  Pain medication not helpful.  No discharge plans.  Would like to discharge to [facility] if they will accept her. A:  Medications administered per MD Orders.  Emotional support and encouragement given patient. R:  Denied HI.  Denied A/V hallucinations.  SI, no plan, will not attempt SI while a patient at [facility].  Contracts for safety.

## 2017-11-14 MED ORDER — GABAPENTIN 300 MG PO CAPS
600.0000 mg | ORAL_CAPSULE | Freq: Every day | ORAL | Status: DC
  Administered 2017-11-14: 600 mg via ORAL
  Filled 2017-11-14 (×4): qty 2

## 2017-11-14 MED ORDER — GABAPENTIN 300 MG PO CAPS
300.0000 mg | ORAL_CAPSULE | Freq: Two times a day (BID) | ORAL | Status: DC
  Administered 2017-11-15: 300 mg via ORAL
  Filled 2017-11-14 (×5): qty 1

## 2017-11-14 MED ORDER — RAMELTEON 8 MG PO TABS
8.0000 mg | ORAL_TABLET | Freq: Every day | ORAL | Status: DC
  Administered 2017-11-14: 8 mg via ORAL
  Filled 2017-11-14 (×4): qty 1

## 2017-11-14 NOTE — Plan of Care (Signed)
Patient verbalizes understanding of information, education provided. 

## 2017-11-14 NOTE — BHH Suicide Risk Assessment (Signed)
[facility] INPATIENT:  Family/Significant Other Suicide Prevention Education  Suicide Prevention Education:  Contact Attempts: Antonietta Barcelona [phone]  has been identified by the patient as the family member/significant other with whom the patient will be residing, and identified as the person(s) who will aid the patient in the event of a mental health crisis.  With written consent from the patient, two attempts were made to provide suicide prevention education, prior to and/or following the patient's discharge.  We were unsuccessful in providing suicide prevention education.  A suicide education pamphlet was given to the patient to share with family/significant other.  Date and time of first attempt: 11/14/17 1:58pm Date and time of second attempt: Contact was made on 11/15/17.  He will be picking the pt up at [place] 11/14/2017, 1:58 PM

## 2017-11-14 NOTE — Progress Notes (Signed)
Pt declined at [facility] per [facility] in admissions due to primary mental health issues-recent SI attempts/general acuity. Pt has [facility] screening scheduled for Wed, 2/20--this is the soonest that she is eligible due to 90 day probationary period. Dr. Sanjuana Letters MD made aware of above.   Maxie Better, MSW, LCSW Clinical Social Worker 11/14/2017 11:42 AM

## 2017-11-14 NOTE — Progress Notes (Signed)
[facility] MD Progress Note  11/14/2017 3:42 PM Ashley Stevens  MRN:  [id number] Subjective:    42 y.o AAF, Divorced. Lives with her partner. Background history of SUD and Mood Disorder. Presented to the unit in company of her significant other. Patient has been feeling down lately. She had expressed suicidal thoughts. Significantly she overdosed a month ago and was hospitalized at [facility]. She missed a court date and had to do some jail time. Patient is on probation. No UDS until yesterday. No substance detected.  Chart reviewed today. Patient discussed at team today.  Staff reports that she isolates self on the unit. She has not been attending most of the unit activities. She was looking forward to rehab at [facility] but got denied today. She is not eligible for [facility] until 11/21/17. Patient has not voiced any suicidal thoughts lately. She has not been observed to be internally stimulated lately.   Seen today. Patient has process the disappointment of not getting into [facility] well. Says she met with peer support and it was fruitful. She wants to pursue training in that field. She plans to attend the groups they offer as an outpatient. Patient says she has been feeling better with Abilify. She is no longer feeling down. Energy levels are good though she is not sleeping well at night. We explored options that could help her sleep better. She has band reactions with antihistamines and anticholinergics. We have agreed to try Ramelteon and Gabapentin at bedtime. No suicidal thoughts. No thoughts of violence. No homicidal thoughts. She feels comfortable with discharge tomorrow.   Principal Problem: MDD (major depressive disorder), recurrent severe, without psychosis ([place]) Diagnosis:   Patient Active Problem List   Diagnosis Date Noted  . MDD (major depressive disorder), recurrent severe, without psychosis ([place]) [F33.2] 11/09/2017  . Cocaine abuse with cocaine-induced mood disorder ([place]) [F14.14]  11/03/2016  . Cocaine use disorder, moderate, dependence ([place]) [F14.20] 05/07/2016  . Cannabis use disorder, moderate, dependence ([facility]) [F12.20] 05/07/2016   Total Time spent with patient: 20 minutes  Past Psychiatric History: As in H&P  Past Medical History:  Past Medical History:  Diagnosis Date  . Anxiety   . Depression   . Shingles   . Suicidal ideations     Past Surgical History:  Procedure Laterality Date  . c-section     Family History:  Family History  Problem Relation Age of Onset  . Heart failure Mother   . Kidney failure Brother   . Diabetes Other   . Hypertension Other    Family Psychiatric  History: As in H&P Social History:  Social History   Substance and Sexual Activity  Alcohol Use No     Social History   Substance and Sexual Activity  Drug Use Yes  . Types: Marijuana, Cocaine   Comment: Pt denies    Social History   Socioeconomic History  . Marital status: Legally Separated    Spouse name: None  . Number of children: None  . Years of education: None  . Highest education level: None  Social Needs  . Financial resource strain: None  . Food insecurity - worry: None  . Food insecurity - inability: None  . Transportation needs - medical: None  . Transportation needs - non-medical: None  Occupational History  . None  Tobacco Use  . Smoking status: Current Every Day Smoker    Packs/day: 1.00    Types: Cigarettes  . Smokeless tobacco: Never Used  Substance and Sexual Activity  .  Alcohol use: No  . Drug use: Yes    Types: Marijuana, Cocaine    Comment: Pt denies  . Sexual activity: Yes    Birth control/protection: Condom  Other Topics Concern  . None  Social History Narrative  . None   Additional Social History:    Pain Medications: See MAR Prescriptions: See MAR Over the Counter: See MAR History of alcohol / drug use?: Yes Longest period of sobriety (when/how long): (P) 2 weeks Negative Consequences of Use: (P) Financial,  Legal Name of Substance 1: Cocaine 1 - Age of First Use: Ukn 1 - Amount (size/oz): 1/2 gram to a gram 1 - Frequency: 3 -4 times a day 1 - Duration: Ongoing for past year 1 - Last Use / Amount: Yesterday Name of Substance 2: Cannabis 2 - Age of First Use: UTA 2 - Amount (size/oz): blunt 2 - Frequency: Monthly 2 - Duration: Ongoing 2 - Last Use / Amount: few days ago                Sleep: Poor  Appetite:  Good  Current Medications: Current Facility-Administered Medications  Medication Dose Route Frequency Provider Last Rate Last Dose  . acetaminophen (TYLENOL) tablet 650 mg  650 mg Oral Q6H PRN Rankin, Shuvon B, NP      . alum & mag hydroxide-simeth (MAALOX/MYLANTA) 200-200-20 MG/5ML suspension 30 mL  30 mL Oral Q4H PRN Rankin, Shuvon B, NP      . ARIPiprazole (ABILIFY) tablet 5 mg  5 mg Oral Daily Cobos, Myer Peer, MD   5 mg at 11/14/17 0856  . gabapentin (NEURONTIN) capsule 300 mg  300 mg Oral TID Cobos, Myer Peer, MD   300 mg at 11/14/17 1159  . ibuprofen (ADVIL,MOTRIN) tablet 600 mg  600 mg Oral Q6H PRN Derrill Center, NP   600 mg at 11/12/17 2029  . lidocaine (LIDODERM) 5 % 1 patch  1 patch Transdermal Daily Derrill Center, NP   1 patch at 11/14/17 0856  . magnesium hydroxide (MILK OF MAGNESIA) suspension 30 mL  30 mL Oral Daily PRN Rankin, Shuvon B, NP      . multivitamin with minerals tablet 1 tablet  1 tablet Oral Daily Cobos, Myer Peer, MD   1 tablet at 11/14/17 0856  . nicotine (NICODERM CQ - dosed in mg/24 hours) patch 14 mg  14 mg Transdermal Daily PRN Rankin, Shuvon B, NP      . sertraline (ZOLOFT) tablet 150 mg  150 mg Oral Daily Cobos, Myer Peer, MD   150 mg at 11/14/17 0855  . thiamine (VITAMIN B-1) tablet 100 mg  100 mg Oral Daily Cobos, Myer Peer, MD   100 mg at 11/14/17 0855  . traZODone (DESYREL) tablet 50 mg  50 mg Oral QHS PRN Rankin, Shuvon B, NP   50 mg at 11/14/17 0113    Lab Results:  Results for orders placed or performed during the hospital  encounter of 11/09/17 (from the past 48 hour(s))  Urinalysis, Complete w Microscopic     Status: Abnormal   Collection Time: 11/13/17  6:19 PM  Result Value Ref Range   Color, Urine YELLOW YELLOW   APPearance CLEAR CLEAR   Specific Gravity, Urine 1.021 1.005 - 1.030   pH 5.0 5.0 - 8.0   Glucose, UA NEGATIVE NEGATIVE mg/dL   Hgb urine dipstick NEGATIVE NEGATIVE   Bilirubin Urine NEGATIVE NEGATIVE   Ketones, ur 5 (A) NEGATIVE mg/dL   Protein, ur NEGATIVE NEGATIVE mg/dL  Nitrite NEGATIVE NEGATIVE   Leukocytes, UA NEGATIVE NEGATIVE   RBC / HPF NONE SEEN 0 - 5 RBC/hpf   WBC, UA 0-5 0 - 5 WBC/hpf   Bacteria, UA NONE SEEN NONE SEEN   Squamous Epithelial / LPF 0-5 (A) NONE SEEN   Mucus PRESENT    Hyaline Casts, UA PRESENT     Comment: Performed at [facility], [place] [address], [place], [place] [address]  Urine rapid drug screen (hosp performed)not at [facility]     Status: None   Collection Time: 11/13/17  6:19 PM  Result Value Ref Range   Opiates NONE DETECTED NONE DETECTED   Cocaine NONE DETECTED NONE DETECTED   Benzodiazepines NONE DETECTED NONE DETECTED   Amphetamines NONE DETECTED NONE DETECTED   Tetrahydrocannabinol NONE DETECTED NONE DETECTED   Barbiturates NONE DETECTED NONE DETECTED    Comment: (NOTE) DRUG SCREEN FOR MEDICAL PURPOSES ONLY.  IF CONFIRMATION IS NEEDED FOR ANY PURPOSE, NOTIFY LAB WITHIN 5 DAYS. LOWEST DETECTABLE LIMITS FOR URINE DRUG SCREEN Drug Class                     Cutoff (ng/mL) Amphetamine and metabolites    1000 Barbiturate and metabolites    200 Benzodiazepine                 438 Tricyclics and metabolites     300 Opiates and metabolites        300 Cocaine and metabolites        300 THC                            50 Performed at [facility], [place] [address] [place], [place] [address]     Blood Alcohol level:  Lab Results  Component Value Date   ETH <10 11/10/2017   ETH <10 97/28/2060    Metabolic  Disorder Labs: Lab Results  Component Value Date   HGBA1C 5.9 (H) 11/10/2017   MPG 122.63 11/10/2017   No results found for: PROLACTIN Lab Results  Component Value Date   CHOL 202 (H) 11/10/2017   TRIG 137 11/10/2017   HDL 51 11/10/2017   CHOLHDL 4.0 11/10/2017   VLDL 27 11/10/2017   LDLCALC 124 (H) 11/10/2017    Physical Findings: AIMS: Facial and Oral Movements Muscles of Facial Expression: None, normal Lips and Perioral Area: None, normal Jaw: None, normal Tongue: None, normal,Extremity Movements Upper (arms, wrists, hands, fingers): None, normal Lower (legs, knees, ankles, toes): None, normal, Trunk Movements Neck, shoulders, hips: None, normal, Overall Severity Severity of abnormal movements (highest score from questions above): None, normal Incapacitation due to abnormal movements: None, normal Patient's awareness of abnormal movements (rate only patient's report): No Awareness, Dental Status Current problems with teeth and/or dentures?: No Does patient usually wear dentures?: No  CIWA:  CIWA-Ar Total: 1 COWS:  COWS Total Score: 2  Musculoskeletal: Strength & Muscle Tone: within normal limits Gait & Station: normal Patient leans: N/A  Psychiatric Specialty Exam: Physical Exam  Constitutional: She is oriented to person, place, and time. She appears well-developed and well-nourished.  HENT:  Head: Normocephalic and atraumatic.  Respiratory: Effort normal.  Neurological: She is alert and oriented to person, place, and time.  Psychiatric:  As above.    ROS  Blood pressure 131/79, pulse (!) 102, temperature 98.4 F (36.9 C), temperature source Oral, resp. rate 16, height 6' 1"  (1.854 m), weight Marland Kitchen)  142.9 kg (315 lb).Body mass index is 41.56 kg/m.  General Appearance: Overweight, neatly dressed. Calm and cooperative. Good relatedness. Appropriate behavior.   Eye Contact:  Good  Speech:  Clear and Coherent and Normal Rate  Volume:  Normal  Mood:  Euthymic   Affect:  Appropriate and Full Range  Thought Process:  Linear  Orientation:  Full (Time, Place, and Person)  Thought Content:  Future oriented. No delusional theme. No preoccupation with violent thoughts. No negative ruminations. No obsession.  No hallucination in any modality.   Suicidal Thoughts:  No  Homicidal Thoughts:  No  Memory:  Immediate;   Good Recent;   Good Remote;   Good  Judgement:  Good  Insight:  Good  Psychomotor Activity:  Normal  Concentration:  Concentration: Good and Attention Span: Good  Recall:  Good  Fund of Knowledge:  Good  Language:  Good  Akathisia:  Negative  Handed:    AIMS (if indicated):     Assets:  Communication Skills Desire for Improvement Intimacy Resilience  ADL's:  Intact  Cognition:  WNL  Sleep:  Number of Hours: 3.5     Treatment Plan Summary: Substance related mood disorder. Patient has improved on current medications. Sleep remains a challenge. She consented to use of Ramelteon after we reviewed the risks and benefits. We plan to evaluate her further. Hopeful discharge tomorrow if she maintains progress.      Artist [place], MD 11/14/2017, 3:42 PM

## 2017-11-14 NOTE — Progress Notes (Signed)
D: Patient observed isolative to room today. Slept off and on but has been actively working on discharge plan. Still interested in [facility] however found out from peer support specialist that she was not accepted. 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 I want to get back to that and I think I'd like to look into being a peer support specialist."  Patient's affect and mood flat, anxious and worried however brightens and relaxes with staff support. Per self inventory and discussions with writer, rates depression at an 8/10 and anxiety at an 8/10. Rates sleep as poor, appetite as good, energy as low and concentration as poor.  States goal for today is "discharge plan, meet with [place]." Denies pain, physical complaints.   A: Medicated per orders, no prns requested or required. Level III obs in place for safety. Emotional support offered and self inventory reviewed. Encouraged completion of Suicide Safety Plan and programming participation. Discussed POC with MD, SW. EKG completed and on chart.  R: Patient verbalizes understanding of POC. Patient denies SI/HI/AVH and remains safe on level III obs. Will continue to monitor closely and make verbal contact frequently.

## 2017-11-14 NOTE — Progress Notes (Signed)
Recreation Therapy Notes  Date: 11/14/17 Time: 0930 Location: 300 [address] Dayroom  Group Topic: Stress Management  Goal Area(s) Addresses:  Patient will verbalize importance of using healthy stress management.  Patient will identify positive emotions associated with healthy stress management.   Intervention: Stress Management  Activity :  LRT introduced the stress management technique of guided imagery.  LRT read a script to guide patients to envision their peaceful place. Patients were to follow along as LRT read script.  Education:  Stress Management, Discharge Planning.   Education Outcome: Acknowledges edcuation/In group clarification offered/Needs additional education  Clinical Observations/Feedback: Pt did not attend group.    Numa Schroeter, LRT/CTRS         Joscelin Fray A 11/14/2017 11:32 AM 

## 2017-11-14 NOTE — BHH Group Notes (Signed)
Pt was invited but did not attend group. 

## 2017-11-14 NOTE — BHH Group Notes (Signed)
LCSW Group Therapy Note   11/14/2017 1:15pm   Type of Therapy and Topic:  Group Therapy:  Overcoming Obstacles   Participation Level:  DID NOT ATTEND. Invited. Chose to remain in bed.    Description of Group:    In this group patients will be encouraged to explore what they see as obstacles to their own wellness and recovery. They will be guided to discuss their thoughts, feelings, and behaviors related to these obstacles. The group will process together ways to cope with barriers, with attention given to specific choices patients can make. Each patient will be challenged to identify changes they are motivated to make in order to overcome their obstacles. This group will be process-oriented, with patients participating in exploration of their own experiences as well as giving and receiving support and challenge from other group members.   Therapeutic Goals: 1. Patient will identify personal and current obstacles as they relate to admission. 2. Patient will identify barriers that currently interfere with their wellness or overcoming obstacles.  3. Patient will identify feelings, thought process and behaviors related to these barriers. 4. Patient will identify two changes they are willing to make to overcome these obstacles:      Summary of Patient Progress   x   Therapeutic Modalities:   Cognitive Behavioral Therapy Solution Focused Therapy Motivational Interviewing Relapse Prevention Therapy  Kimber Relic Smart, LCSW 11/14/2017 12:41 PM

## 2017-11-14 NOTE — Progress Notes (Signed)
Patient did attend the evening speaker NA meeting.  

## 2017-11-15 MED ORDER — GABAPENTIN 300 MG PO CAPS: 300.0000 mg | ORAL_CAPSULE | Freq: Two times a day (BID) | ORAL | 0 refills | Status: DC

## 2017-11-15 MED ORDER — ARIPIPRAZOLE 5 MG PO TABS: 5.0000 mg | ORAL_TABLET | Freq: Every day | ORAL | 0 refills | Status: DC

## 2017-11-15 MED ORDER — TRAZODONE HCL 50 MG PO TABS: 50.0000 mg | ORAL_TABLET | Freq: Every evening | ORAL | 0 refills | Status: DC | PRN

## 2017-11-15 MED ORDER — RAMELTEON 8 MG PO TABS: 8.0000 mg | ORAL_TABLET | Freq: Every day | ORAL | 0 refills | Status: DC

## 2017-11-15 MED ORDER — SERTRALINE HCL 50 MG PO TABS: 150.0000 mg | ORAL_TABLET | Freq: Every day | ORAL | 0 refills | Status: DC

## 2017-11-15 MED ORDER — GABAPENTIN 300 MG PO CAPS: 600.0000 mg | ORAL_CAPSULE | Freq: Every day | ORAL | 0 refills | Status: DC

## 2017-11-15 NOTE — Progress Notes (Signed)
Patient ID: Ashley Stevens, female   DOB: November 28, 1975, 42 y.o.   MRN: [id number]   D: Patient calm and cooperate on approach. Pt mood and affect appeared depressed and flat. Pt reports goal is to continue working on discharge tomorrow. Pt reports she is tolerating medication well. Pt denies SI/HI/AVH. Pt attended and participated in evening NA group. Cooperative with assessment. No acute distressed noted at this time.   A: Medications administered as prescribed. Support and encouragement provided as needed. Pt encouraged to discuss feelings and come to staff with any question or concerns.   R: Patient remains safe and complaint with medications.
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
## 2017-11-15 NOTE — Progress Notes (Signed)
Pt d/c from the hospital. All items returned. D/C instructions given and prescriptions given. Pt denies si and hi. 

## 2017-11-15 NOTE — BHH Suicide Risk Assessment (Signed)
[facility] Discharge Suicide Risk Assessment   Principal Problem: MDD (major depressive disorder), recurrent severe, without psychosis ([place]) Discharge Diagnoses:  Patient Active Problem List   Diagnosis Date Noted  . MDD (major depressive disorder), recurrent severe, without psychosis ([place]) [F33.2] 11/09/2017  . Cocaine abuse with cocaine-induced mood disorder ([place]) [F14.14] 11/03/2016  . Cocaine use disorder, moderate, dependence ([place]) [F14.20] 05/07/2016  . Cannabis use disorder, moderate, dependence ([facility]) [F12.20] 05/07/2016    Total Time spent with patient: 45 minutes  Musculoskeletal: Strength & Muscle Tone: within normal limits Gait & Station: normal Patient leans: N/A  Psychiatric Specialty Exam: Review of Systems  Constitutional: Negative.   HENT: Negative.   Eyes: Negative.   Respiratory: Negative.   Cardiovascular: Negative.   Gastrointestinal: Negative.   Genitourinary: Negative.   Musculoskeletal: Negative.   Skin: Negative.   Neurological: Negative.   Endo/Heme/Allergies: Negative.   Psychiatric/Behavioral: Negative for depression, hallucinations, memory loss, substance abuse and suicidal ideas. The patient is not nervous/anxious and does not have insomnia.     Blood pressure 107/75, pulse 100, temperature 98.3 F (36.8 C), temperature source Oral, resp. rate 16, height 6\' 1"  (1.854 m), weight (!) 142.9 kg (315 lb).Body mass index is 41.56 kg/m.  General Appearance: Neatly dressed, pleasant, engaging well and cooperative. Appropriate behavior. Not in any distress. Good relatedness. Not internally stimulated.  Eye Contact::  Good  Speech:  Spontaneous, normal prosody. Normal tone and rate.   Volume:  Normal  Mood:  Euthymic  Affect:  Appropriate and Full Range  Thought Process:  Linear  Orientation:  Full (Time, Place, and Person)  Thought Content:  No delusional theme. No preoccupation with violent thoughts. No negative ruminations. No obsession.  No hallucination  in any modality.   Suicidal Thoughts:  No  Homicidal Thoughts:  No  Memory:  Immediate;   Good Recent;   Good Remote;   Good  Judgement:  Good  Insight:  Good  Psychomotor Activity:  Normal  Concentration:  Good  Recall:  Good  Fund of Knowledge:Good  Language: Good  Akathisia:  Negative  Handed:    AIMS (if indicated):     Assets:  Communication Skills Desire for Improvement Housing Intimacy Resilience  Sleep:  Number of Hours: 6  Cognition: WNL  ADL's:  Intact   Clinical Assessment::   42 y.o AAF, Divorced. Lives with her partner. Background history of SUD and Mood Disorder. Presented to the unit in company of her significant other. Patient has been feeling down lately. She had expressed suicidal thoughts. Significantly she overdosed a month ago and was hospitalized at [facility]. She missed a court date and had to do some jail time. Patient is on probation. No UDS until yesterday. No substance detected.  Seen today. Slept better with new medication. She is tolerating her medications well. She has been in contact with her partner. He would be picking her up later today. Reports that she is in good spirits. Not feeling depressed. Reports normal energy and interest.She is able to think clearly. She is able to focus on task. Her thoughts are not crowded or racing. No evidence of mania. No hallucination in any modality. She is not making any delusional statement. No passivity of will/thought. He is fully in touch with reality. No thoughts of suicide. No thoughts of homicide. No violent thoughts. No overwhelming anxiety. No access to weapons. Denies any new stressors at home.   Nursing staff reports that patient has been appropriate on the unit. Patient has  been interacting well with peers. No behavioral issues. Patient has not voiced any suicidal thoughts. Patient has not been observed to be internally stimulated. Patient has been adherent with treatment recommendations. Patient has  been tolerating their medication well.   Patient was discussed at team. Team members feels that patient is back to her baseline level of function. Team agrees with plan to discharge patient today.   Demographic Factors:  Divorced or widowed, Low socioeconomic status and Unemployed  Loss Factors: Legal issues  Historical Factors: Impulsivity  Risk Reduction Factors:   Living with another person, especially a relative, Positive social support, Positive therapeutic relationship and Positive coping skills or problem solving skills  Continued Clinical Symptoms:  As above   Cognitive Features That Contribute To Risk:  None    Suicide Risk:  Minimal: No identifiable suicidal ideation.  Patient is not having any thoughts of suicide at this time. Modifiable risk factors targeted during this admission includes depression and substance use. Demographical and historical risk factors cannot be modified. Patient is now engaging well. Patient is reliable and is future oriented. We have buffered patient's support structures. At this point, patient is at low risk of suicide. Patient is aware of the effects of psychoactive substances on decision making process. Patient has been provided with emergency contacts. Patient acknowledges to use resources provided if unforseen circumstances changes their current risk stratification.    Follow-up Information    [facility] Follow up on 11/21/2017.   Why:  Screening for possible admission on Wed, 2/20 at 8:00AM. (this is the 1st day you will be eligible for return to this program). Please bring; 30 day supply of medications, photo ID/proof of [employer] residency, and clothing (no leggings). Thank you Contact information: [address] [place] [address] [phone]        [facility] Follow up.   Specialty:  Professional Counselor Why:  Please walk in within 3 days of hospital discharge to be assessed for  outpatient mental health services including medication management, counseling, and group therapy. Walk in hours: 8am-12pm. Thank you.  Contact information: [address] [place] [address] [phone]           Plan Of Care/Follow-up recommendations:  1. Continue current psychotropic medications 2. Mental health and addiction follow up as arranged.  3. Discharge in care of her fiancee  4. Provided limited quantity of prescriptions   [place], MD 11/15/2017, 10:27 AM

## 2017-11-15 NOTE — Progress Notes (Signed)
  [facility] Adult Case Management Discharge Plan :  Will you be returning to the same living situation after discharge:  Yes,  home with fiance At discharge, do you have transportation home?: Yes,  fiance/bus Do you have the ability to pay for your medications: Yes,  [facility]  Release of information consent forms completed and submitted to medical records by CSW.  Patient to Follow up at: Follow-up Information    [facility] Follow up on 11/21/2017.   Why:  Screening for possible admission on Wed, 2/20 at 8:00AM. (this is the 1st day you will be eligible for return to this program). Please bring; 30 day supply of medications, photo ID/proof of [employer] residency, and clothing (no leggings). Thank you Contact information: [address] [place] [address] [phone]        [facility] Follow up.   Specialty:  Professional Counselor Why:  Please walk in within 3 days of hospital discharge to be assessed for outpatient mental health services including medication management, counseling, and group therapy. Walk in hours: 8am-12pm. Thank you.  Contact information: [place] [phone]           Next level of care provider has access to [place] and Suicide Prevention discussed: Yes,  SPE completed with pt; contact attempts made with pt's fiance. SPI pamphlet and Mobile Crisis information provided.   Have you used any form of tobacco in the last 30 days? (Cigarettes, Smokeless Tobacco, Cigars, and/or Pipes): Yes  Has patient been referred to the Quitline?: Patient refused referral  Patient has been referred for addiction treatment: Yes  [employer], LCSW 11/15/2017, 9:25 AM

## 2018-06-12 ENCOUNTER — Emergency Department (HOSPITAL_COMMUNITY): Payer: No Typology Code available for payment source

## 2018-06-12 ENCOUNTER — Encounter (HOSPITAL_COMMUNITY): Payer: Self-pay | Admitting: Emergency Medicine

## 2018-06-12 ENCOUNTER — Emergency Department (HOSPITAL_COMMUNITY)
Admission: EM | Admit: 2018-06-12 | Discharge: 2018-06-12 | Disposition: A | Discharge status: Home / Self Care | Payer: No Typology Code available for payment source | Attending: Emergency Medicine | Admitting: Emergency Medicine

## 2018-06-12 DIAGNOSIS — S0502XA Injury of conjunctiva and corneal abrasion without foreign body, left eye, initial encounter: Secondary | ICD-10-CM | POA: Diagnosis not present

## 2018-06-12 DIAGNOSIS — Y929 Unspecified place or not applicable: Secondary | ICD-10-CM | POA: Insufficient documentation

## 2018-06-12 DIAGNOSIS — Z79899 Other long term (current) drug therapy: Secondary | ICD-10-CM | POA: Insufficient documentation

## 2018-06-12 DIAGNOSIS — F17210 Nicotine dependence, cigarettes, uncomplicated: Secondary | ICD-10-CM | POA: Insufficient documentation

## 2018-06-12 DIAGNOSIS — Y939 Activity, unspecified: Secondary | ICD-10-CM | POA: Diagnosis not present

## 2018-06-12 DIAGNOSIS — M791 Myalgia, unspecified site: Secondary | ICD-10-CM | POA: Diagnosis not present

## 2018-06-12 DIAGNOSIS — M7918 Myalgia, other site: Secondary | ICD-10-CM

## 2018-06-12 DIAGNOSIS — Y999 Unspecified external cause status: Secondary | ICD-10-CM | POA: Diagnosis not present

## 2018-06-12 DIAGNOSIS — Z23 Encounter for immunization: Secondary | ICD-10-CM | POA: Insufficient documentation

## 2018-06-12 MED ORDER — FLUORESCEIN SODIUM 1 MG OP STRP
1.0000 | ORAL_STRIP | Freq: Once | OPHTHALMIC | Status: AC
  Administered 2018-06-12: 1 via OPHTHALMIC
  Filled 2018-06-12: qty 1

## 2018-06-12 MED ORDER — NAPROXEN 500 MG PO TABS: 500.0000 mg | ORAL_TABLET | Freq: Two times a day (BID) | ORAL | 0 refills | Status: DC

## 2018-06-12 MED ORDER — KETOROLAC TROMETHAMINE 30 MG/ML IJ SOLN
30.0000 mg | Freq: Once | INTRAMUSCULAR | Status: AC
  Administered 2018-06-12: 30 mg via INTRAVENOUS
  Filled 2018-06-12: qty 1

## 2018-06-12 MED ORDER — TETRACAINE HCL 0.5 % OP SOLN
2.0000 [drp] | Freq: Once | OPHTHALMIC | Status: AC
  Administered 2018-06-12: 2 [drp] via OPHTHALMIC
  Filled 2018-06-12: qty 4

## 2018-06-12 MED ORDER — ERYTHROMYCIN 5 MG/GM OP OINT: TOPICAL_OINTMENT | OPHTHALMIC | 0 refills | Status: DC

## 2018-06-12 MED ORDER — CYCLOBENZAPRINE HCL 10 MG PO TABS
5.0000 mg | ORAL_TABLET | Freq: Once | ORAL | Status: AC
Start: 2018-06-12 — End: 2018-06-12
  Administered 2018-06-12: 5 mg via ORAL
  Filled 2018-06-12: qty 1

## 2018-06-12 MED ORDER — TETANUS-DIPHTH-ACELL PERTUSSIS 5-2.5-18.5 LF-MCG/0.5 IM SUSP
0.5000 mL | Freq: Once | INTRAMUSCULAR | Status: AC
  Administered 2018-06-12: 0.5 mL via INTRAMUSCULAR
  Filled 2018-06-12: qty 0.5

## 2018-06-12 MED ORDER — CYCLOBENZAPRINE HCL 5 MG PO TABS: 5.0000 mg | ORAL_TABLET | Freq: Two times a day (BID) | ORAL | 0 refills | Status: DC | PRN

## 2018-06-12 NOTE — Discharge Instructions (Addendum)
You will be very sore in the next 1 to 2 days.  Take medications as prescribed.

## 2018-06-12 NOTE — ED Notes (Signed)
Pt ambulated to the restroom with steady gait. Pt tolerated well.

## 2018-06-12 NOTE — ED Notes (Signed)
Pt requesting a wrap for her ankle.  This RN gave pt an ACE bandage.  Pt refused a w/c and ambulated independently to lobby. Pt has been D/C.

## 2018-06-12 NOTE — ED Notes (Signed)
Patient transported to X-ray 
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
## 2018-06-16 ENCOUNTER — Emergency Department (HOSPITAL_COMMUNITY)
Admission: EM | Admit: 2018-06-16 | Discharge: 2018-06-16 | Disposition: A | Discharge status: Home / Self Care | Payer: No Typology Code available for payment source | Attending: Emergency Medicine | Admitting: Emergency Medicine

## 2018-06-16 ENCOUNTER — Encounter (HOSPITAL_COMMUNITY): Payer: Self-pay | Admitting: Emergency Medicine

## 2018-06-16 ENCOUNTER — Emergency Department (HOSPITAL_COMMUNITY): Payer: No Typology Code available for payment source

## 2018-06-16 DIAGNOSIS — Y999 Unspecified external cause status: Secondary | ICD-10-CM | POA: Diagnosis not present

## 2018-06-16 DIAGNOSIS — F17210 Nicotine dependence, cigarettes, uncomplicated: Secondary | ICD-10-CM | POA: Diagnosis not present

## 2018-06-16 DIAGNOSIS — Y939 Activity, unspecified: Secondary | ICD-10-CM | POA: Diagnosis not present

## 2018-06-16 DIAGNOSIS — Y929 Unspecified place or not applicable: Secondary | ICD-10-CM | POA: Insufficient documentation

## 2018-06-16 DIAGNOSIS — Z79899 Other long term (current) drug therapy: Secondary | ICD-10-CM | POA: Insufficient documentation

## 2018-06-16 DIAGNOSIS — K625 Hemorrhage of anus and rectum: Secondary | ICD-10-CM | POA: Diagnosis not present

## 2018-06-16 DIAGNOSIS — S3992XA Unspecified injury of lower back, initial encounter: Secondary | ICD-10-CM | POA: Diagnosis present

## 2018-06-16 DIAGNOSIS — S39012A Strain of muscle, fascia and tendon of lower back, initial encounter: Secondary | ICD-10-CM | POA: Diagnosis not present

## 2018-06-16 LAB — COMPREHENSIVE METABOLIC PANEL
ALK PHOS: 71 U/L (ref 38–126)
ALT: 12 U/L (ref 0–44)
ANION GAP: 5 (ref 5–15)
AST: 19 U/L (ref 15–41)
Albumin: 3.3 g/dL — ABNORMAL LOW (ref 3.5–5.0)
BILIRUBIN TOTAL: 0.3 mg/dL (ref 0.3–1.2)
BUN: 11 mg/dL (ref 6–20)
CALCIUM: 8.7 mg/dL — AB (ref 8.9–10.3)
CO2: 25 mmol/L (ref 22–32)
Chloride: 109 mmol/L (ref 98–111)
Creatinine, Ser: 1.16 mg/dL — ABNORMAL HIGH (ref 0.44–1.00)
GFR calc non Af Amer: 57 mL/min — ABNORMAL LOW (ref >60)
Glucose, Bld: 92 mg/dL (ref 70–99)
Potassium: 4 mmol/L (ref 3.5–5.1)
SODIUM: 139 mmol/L (ref 135–145)
TOTAL PROTEIN: 6.4 g/dL — AB (ref 6.5–8.1)

## 2018-06-16 LAB — CBC WITH DIFFERENTIAL/PLATELET
Basophils Absolute: 0 10*3/uL (ref 0.0–0.1)
Basophils Relative: 0 %
Eosinophils Absolute: 0.2 10*3/uL (ref 0.0–0.7)
Eosinophils Relative: 3 %
HEMATOCRIT: 41.5 % (ref 36.0–46.0)
HEMOGLOBIN: 13.2 g/dL (ref 12.0–15.0)
LYMPHS ABS: 2.7 10*3/uL (ref 0.7–4.0)
LYMPHS PCT: 33 %
MCH: 26.6 pg (ref 26.0–34.0)
MCHC: 31.8 g/dL (ref 30.0–36.0)
MCV: 83.5 fL (ref 78.0–100.0)
MONOS PCT: 11 %
Monocytes Absolute: 0.9 10*3/uL (ref 0.1–1.0)
NEUTROS PCT: 53 %
Neutro Abs: 4.4 10*3/uL (ref 1.7–7.7)
Platelets: 332 10*3/uL (ref 150–400)
RBC: 4.97 MIL/uL (ref 3.87–5.11)
RDW: 14.7 % (ref 11.5–15.5)
WBC: 8.3 10*3/uL (ref 4.0–10.5)

## 2018-06-16 LAB — URINALYSIS, ROUTINE W REFLEX MICROSCOPIC
BILIRUBIN URINE: NEGATIVE
Glucose, UA: NEGATIVE mg/dL
Hgb urine dipstick: NEGATIVE
KETONES UR: NEGATIVE mg/dL
Leukocytes, UA: NEGATIVE
NITRITE: NEGATIVE
PH: 6 (ref 5.0–8.0)
PROTEIN: NEGATIVE mg/dL
Specific Gravity, Urine: 1.017 (ref 1.005–1.030)

## 2018-06-16 LAB — OCCULT BLOOD X 1 CARD TO LAB, STOOL: FECAL OCCULT BLD: POSITIVE — AB

## 2018-06-16 LAB — HCG, SERUM, QUALITATIVE: Preg, Serum: NEGATIVE

## 2018-06-16 MED ORDER — METHOCARBAMOL 500 MG PO TABS: 1000.0000 mg | ORAL_TABLET | Freq: Three times a day (TID) | ORAL | 0 refills | Status: DC | PRN

## 2018-06-16 MED ORDER — METHOCARBAMOL 500 MG PO TABS
1000.0000 mg | ORAL_TABLET | Freq: Once | ORAL | Status: AC
  Administered 2018-06-16: 1000 mg via ORAL
  Filled 2018-06-16: qty 2

## 2018-06-16 MED ORDER — LANSOPRAZOLE 30 MG PO CPDR: 30.0000 mg | DELAYED_RELEASE_CAPSULE | Freq: Every day | ORAL | 0 refills | Status: DC
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
## 2018-06-16 NOTE — ED Notes (Signed)
Bed: WA01 Expected date:  Expected time:  Means of arrival:  Comments: 41 yo F/Left flank pain rectal bleeding
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
## 2018-06-17 ENCOUNTER — Encounter: Payer: Self-pay | Admitting: Gastroenterology

## 2018-07-17 ENCOUNTER — Encounter (HOSPITAL_COMMUNITY): Payer: Self-pay | Admitting: Emergency Medicine

## 2018-07-17 ENCOUNTER — Ambulatory Visit (HOSPITAL_COMMUNITY)
Admission: EM | Admit: 2018-07-17 | Discharge: 2018-07-17 | Disposition: A | Discharge status: Home / Self Care | Payer: Self-pay | Attending: Family Medicine | Admitting: Family Medicine

## 2018-07-17 ENCOUNTER — Ambulatory Visit (INDEPENDENT_AMBULATORY_CARE_PROVIDER_SITE_OTHER): Payer: Self-pay

## 2018-07-17 DIAGNOSIS — M79644 Pain in right finger(s): Secondary | ICD-10-CM

## 2018-07-17 DIAGNOSIS — M79671 Pain in right foot: Secondary | ICD-10-CM

## 2018-07-17 DIAGNOSIS — M79645 Pain in left finger(s): Secondary | ICD-10-CM

## 2018-07-17 DIAGNOSIS — R6884 Jaw pain: Secondary | ICD-10-CM

## 2018-07-17 NOTE — Discharge Instructions (Signed)
You may use over the counter Tylenol as needed for discomfort.
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
## 2018-07-25 ENCOUNTER — Ambulatory Visit: Payer: Self-pay | Admitting: Gastroenterology

## 2018-07-25 ENCOUNTER — Encounter

## 2018-07-25 NOTE — Progress Notes (Deleted)
Referring Provider: No ref. provider found Primary Care Physician:  Patient, No Pcp Per   Reason for Consultation:  ***   IMPRESSION:  ***  PLAN: ***   HPI: Ashley Stevens is a 42 y.o. female    Past Medical History:  Diagnosis Date  . Anxiety   . Depression   . Shingles   . Suicidal ideations     Past Surgical History:  Procedure Laterality Date  . c-section      Prior to Admission medications   Medication Sig Start Date End Date Taking? Authorizing Provider  ARIPiprazole (ABILIFY) 5 MG tablet Take 1 tablet (5 mg total) by mouth daily. For mood control Patient not taking: Reported on 06/16/2018 11/16/17   Money, Lowry Ram, FNP  cyclobenzaprine (FLEXERIL) 5 MG tablet Take 1 tablet (5 mg total) by mouth 2 (two) times daily as needed for muscle spasms. Patient not taking: Reported on 07/17/2018 06/12/18   Horton, Barbette Hair, MD  erythromycin ophthalmic ointment Place a 1/2 inch ribbon of ointment into the lower eyelid. 06/12/18   Horton, Barbette Hair, MD  gabapentin (NEURONTIN) 300 MG capsule Take 2 capsules (600 mg total) by mouth at bedtime. 11/15/17   Money, Lowry Ram, FNP  gabapentin (NEURONTIN) 300 MG capsule Take 1 capsule (300 mg total) by mouth 2 (two) times daily. 11/15/17   Money, Lowry Ram, FNP  lansoprazole (PREVACID) 30 MG capsule Take 1 capsule (30 mg total) by mouth daily at 12 noon. 06/16/18   Julianne Rice, MD  methocarbamol (ROBAXIN) 500 MG tablet Take 2 tablets (1,000 mg total) by mouth every 8 (eight) hours as needed for muscle spasms. Patient not taking: Reported on 07/17/2018 06/16/18   Julianne Rice, MD  QUEtiapine (SEROQUEL) 300 MG tablet Take 300 mg by mouth at bedtime.    [provider]  ramelteon (ROZEREM) 8 MG tablet Take 1 tablet (8 mg total) by mouth at bedtime. For sleep Patient not taking: Reported on 06/16/2018 11/15/17   Money, Lowry Ram, FNP  sertraline (ZOLOFT) 50 MG tablet Take 3 tablets (150 mg total) by mouth daily. For mood  control 11/16/17   Money, Lowry Ram, FNP  traZODone (DESYREL) 50 MG tablet Take 1 tablet (50 mg total) by mouth at bedtime as needed for sleep. Patient not taking: Reported on 07/17/2018 11/15/17   Money, Lowry Ram, FNP    Current Outpatient Medications  Medication Sig Dispense Refill  . ARIPiprazole (ABILIFY) 5 MG tablet Take 1 tablet (5 mg total) by mouth daily. For mood control (Patient not taking: Reported on 06/16/2018) 30 tablet 0  . cyclobenzaprine (FLEXERIL) 5 MG tablet Take 1 tablet (5 mg total) by mouth 2 (two) times daily as needed for muscle spasms. (Patient not taking: Reported on 07/17/2018) 10 tablet 0  . erythromycin ophthalmic ointment Place a 1/2 inch ribbon of ointment into the lower eyelid. 1 g 0  . gabapentin (NEURONTIN) 300 MG capsule Take 2 capsules (600 mg total) by mouth at bedtime. 60 capsule 0  . gabapentin (NEURONTIN) 300 MG capsule Take 1 capsule (300 mg total) by mouth 2 (two) times daily. 60 capsule 0  . lansoprazole (PREVACID) 30 MG capsule Take 1 capsule (30 mg total) by mouth daily at 12 noon. 30 capsule 0  . methocarbamol (ROBAXIN) 500 MG tablet Take 2 tablets (1,000 mg total) by mouth every 8 (eight) hours as needed for muscle spasms. (Patient not taking: Reported on 07/17/2018) 30 tablet 0  . QUEtiapine (SEROQUEL) 300 MG tablet  Take 300 mg by mouth at bedtime.    . ramelteon (ROZEREM) 8 MG tablet Take 1 tablet (8 mg total) by mouth at bedtime. For sleep (Patient not taking: Reported on 06/16/2018) 30 tablet 0  . sertraline (ZOLOFT) 50 MG tablet Take 3 tablets (150 mg total) by mouth daily. For mood control 90 tablet 0  . traZODone (DESYREL) 50 MG tablet Take 1 tablet (50 mg total) by mouth at bedtime as needed for sleep. (Patient not taking: Reported on 07/17/2018) 30 tablet 0   No current facility-administered medications for this visit.     Allergies as of 07/25/2018 - Review Complete 07/17/2018  Allergen Reaction Noted  . Ibuprofen  06/12/2018  . Percocet  [oxycodone-acetaminophen] Nausea And Vomiting 06/12/2018  . Tylenol [acetaminophen]  06/12/2018    Family History  Problem Relation Age of Onset  . Heart failure Mother   . Kidney failure Brother   . Diabetes Other   . Hypertension Other     Social History   Socioeconomic History  . Marital status: Legally Separated    Spouse name: Not on file  . Number of children: Not on file  . Years of education: Not on file  . Highest education level: Not on file  Occupational History  . Not on file  Social Needs  . Financial resource strain: Not on file  . Food insecurity:    Worry: Not on file    Inability: Not on file  . Transportation needs:    Medical: Not on file    Non-medical: Not on file  Tobacco Use  . Smoking status: Current Every Day Smoker    Packs/day: 1.00    Types: Cigarettes  . Smokeless tobacco: Never Used  Substance and Sexual Activity  . Alcohol use: No  . Drug use: Yes    Types: Marijuana, Cocaine    Comment: Pt denies  . Sexual activity: Yes    Birth control/protection: Condom  Lifestyle  . Physical activity:    Days per week: Not on file    Minutes per session: Not on file  . Stress: Not on file  Relationships  . Social connections:    Talks on phone: Not on file    Gets together: Not on file    Attends religious service: Not on file    Active member of club or organization: Not on file    Attends meetings of clubs or organizations: Not on file    Relationship status: Not on file  . Intimate partner violence:    Fear of current or ex partner: Not on file    Emotionally abused: Not on file    Physically abused: Not on file    Forced sexual activity: Not on file  Other Topics Concern  . Not on file  Social History Narrative  . Not on file    Review of Systems: 12 system ROS is negative except as noted above.   Physical Exam: Vital signs were reviewed. General:   Alert, well-nourished, pleasant and cooperative in NAD Head:  Normocephalic  and atraumatic. Eyes:  Sclera clear, no icterus.   Conjunctiva pink. Mouth:  No deformity or lesions.   Neck:  Supple; no thyromegaly. Lungs:  Clear throughout to auscultation.   No wheezes.  Heart:  Regular rate and rhythm; no murmurs Abdomen:  Soft, nontender, normal bowel sounds. No rebound or guarding. No hepatosplenomegaly Rectal:  Deferred  Msk:  Symmetrical without gross deformities. Extremities:  No gross deformities or edema. Neurologic:  Alert and  oriented x4;  grossly nonfocal Skin:  No rash or bruise. Psych:  Alert and cooperative. Normal mood and affect.   Shanece Cochrane L. Tarri Glenn Md, MPH [facility] 07/25/2018, 6:55 AM
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
## 2018-12-06 ENCOUNTER — Encounter (HOSPITAL_COMMUNITY): Payer: Self-pay | Admitting: Family Medicine

## 2018-12-06 ENCOUNTER — Ambulatory Visit (HOSPITAL_COMMUNITY)
Admission: EM | Admit: 2018-12-06 | Discharge: 2018-12-06 | Disposition: A | Discharge status: Home / Self Care | Payer: Medicaid Other | Attending: Family Medicine | Admitting: Family Medicine

## 2018-12-06 ENCOUNTER — Other Ambulatory Visit: Payer: Self-pay

## 2018-12-06 DIAGNOSIS — R21 Rash and other nonspecific skin eruption: Secondary | ICD-10-CM

## 2018-12-06 MED ORDER — ALBENDAZOLE 200 MG PO TABS: 400.0000 mg | ORAL_TABLET | Freq: Two times a day (BID) | ORAL | 0 refills | Status: DC

## 2018-12-06 MED ORDER — HYDROXYZINE HCL 25 MG PO TABS: 25.0000 mg | ORAL_TABLET | Freq: Four times a day (QID) | ORAL | 0 refills | Status: DC

## 2018-12-06 MED ORDER — PERMETHRIN 5 % EX CREA: TOPICAL_CREAM | CUTANEOUS | 1 refills | Status: DC

## 2018-12-06 NOTE — ED Triage Notes (Signed)
Per pt she has seen worms in the toilet and also had stayed in a clients home and since then started itching all over. Was given medication for the body lice but is still itching. No fevers N/V

## 2018-12-06 NOTE — Discharge Instructions (Addendum)
We are treating you for scabies and pin worms.  Hydroxyzine for itching  as needed  Take the medications as prescribed.  Follow up as needed for continued or worsening symptoms

## 2018-12-09 NOTE — ED Provider Notes (Signed)
[place] [facility]    CSN: [id number] Arrival date & time: 12/06/18  1715     History   Chief Complaint Chief Complaint  Patient presents with  . Pruritis    body lice    HPI Ashley Stevens is a 43 y.o. female.   Patient is a 43 year old female who presents today with scabies infestation and possible pinworms.  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  All the symptoms started after sleeping in her spare bedroom.  She has already been treated for scabies once but is still having symptoms.  She had a bowel movement and onto the toilet noticed and small worms.  She has had some rectal itching.  This problem has been constant.  She is used permethrin cream one time without any relief.   Denies any fever, joint pain. Denies any recent changes in lotions, detergents, foods or other possible irritants. No recent travel. Nobody else at home has the rash. Patient has been outside but denies any contact with plants or insects. No new foods or medications.   ROS per HPI       Past Medical History:  Diagnosis Date  . Anxiety   . Depression   . Shingles   . Suicidal ideations     Patient Active Problem List   Diagnosis Date Noted  . MDD (major depressive disorder), recurrent severe, without psychosis ([place]) 11/09/2017  . Cocaine abuse with cocaine-induced mood disorder ([place]) 11/03/2016  . Cocaine use disorder, moderate, dependence ([place]) 05/07/2016  . Cannabis use disorder, moderate, dependence ([place]) 05/07/2016    Past Surgical History:  Procedure Laterality Date  . c-section      OB History   No obstetric history on file.      Home Medications    Prior to Admission medications   Medication Sig Start Date End Date Taking? Authorizing Provider  albendazole (ALBENZA) 200 MG tablet Take 2 tablets (400 mg total) by mouth 2 (two) times daily. 12/06/18   Loura Halt A, NP  erythromycin ophthalmic ointment Place a 1/2 inch ribbon  of ointment into the lower eyelid. 06/12/18   Horton, Barbette Hair, MD  gabapentin (NEURONTIN) 300 MG capsule Take 2 capsules (600 mg total) by mouth at bedtime. 11/15/17   Money, Lowry Ram, FNP  gabapentin (NEURONTIN) 300 MG capsule Take 1 capsule (300 mg total) by mouth 2 (two) times daily. 11/15/17   Money, Lowry Ram, FNP  hydrOXYzine (ATARAX/VISTARIL) 25 MG tablet Take 1 tablet (25 mg total) by mouth every 6 (six) hours. 12/06/18   Loura Halt A, NP  lansoprazole (PREVACID) 30 MG capsule Take 1 capsule (30 mg total) by mouth daily at 12 noon. 06/16/18   Julianne Rice, MD  permethrin (ELIMITE) 5 % cream Apply to affected area once 12/06/18   Loura Halt A, NP  QUEtiapine (SEROQUEL) 300 MG tablet Take 300 mg by mouth at bedtime.    [provider]  sertraline (ZOLOFT) 50 MG tablet Take 3 tablets (150 mg total) by mouth daily. For mood control 11/16/17   Money, Lowry Ram, FNP    Family History Family History  Problem Relation Age of Onset  . Heart failure Mother   . Kidney failure Brother   . Diabetes Other   . Hypertension Other     Social History Social History   Tobacco Use  . Smoking status: Current Every Day Smoker    Packs/day: 1.00    Types:  Cigarettes  . Smokeless tobacco: Never Used  Substance Use Topics  . Alcohol use: No  . Drug use: Yes    Types: Marijuana, Cocaine    Comment: Pt denies     Allergies   Ibuprofen; Percocet [oxycodone-acetaminophen]; and Tylenol [acetaminophen]   Review of Systems Review of Systems   Physical Exam Triage Vital Signs ED Triage Vitals  Enc Vitals Group     BP 12/06/18 1806 (!) 190/105     Pulse Rate 12/06/18 1803 97     Resp 12/06/18 1803 16     Temp 12/06/18 1803 (!) 97.1 F (36.2 C)     Temp Source 12/06/18 1803 Oral     SpO2 12/06/18 1803 100 %     Weight 12/06/18 1804 280 lb (127 kg)     Height 12/06/18 1804 6' (1.829 m)     Head Circumference --      Peak Flow --      Pain Score 12/06/18 1804 0     Pain Loc --        Pain Edu? --      Excl. in [place]? --    No data found.  Updated Vital Signs BP (!) 190/105   Pulse 97   Temp (!) 97.1 F (36.2 C) (Oral)   Resp 16   Ht 6' (1.829 m)   Wt 280 lb (127 kg)   LMP 12/06/2018   SpO2 100%   BMI 37.97 kg/m   Visual Acuity Right Eye Distance:   Left Eye Distance:   Bilateral Distance:    Right Eye Near:   Left Eye Near:    Bilateral Near:     Physical Exam Vitals signs and nursing note reviewed.  Constitutional:      Appearance: Normal appearance.     Comments: Pt tearful   HENT:     Head: Normocephalic and atraumatic.     Nose: Nose normal.     Mouth/Throat:     Pharynx: Oropharynx is clear.  Eyes:     Conjunctiva/sclera: Conjunctivae normal.  Neck:     Musculoskeletal: Normal range of motion.  Pulmonary:     Effort: Pulmonary effort is normal.  Musculoskeletal: Normal range of motion.  Skin:    Findings: Rash present.  Neurological:     Mental Status: She is alert.  Psychiatric:        Mood and Affect: Mood normal.      UC Treatments / Results  Labs (all labs ordered are listed, but only abnormal results are displayed) Labs Reviewed - No data to display  EKG None  Radiology No results found.  Procedures Procedures (including critical care time)  Medications Ordered in UC Medications - No data to display  Initial Impression / Assessment and Plan / UC Course  I have reviewed the triage vital signs and the nursing notes.  Pertinent labs & imaging results that were available during my care of the patient were reviewed by me and considered in my medical decision making (see chart for details).     Will treat pt again for scabies Treating for pin worms with Albenza Hydroxyzine for itching Follow up as needed for continued or worsening symptoms   Final Clinical Impressions(s) / UC Diagnoses   Final diagnoses:  Rash     Discharge Instructions     We are treating you for scabies and pin worms.   Hydroxyzine for itching  as needed  Take the medications as prescribed.  Follow up as  needed for continued or worsening symptoms     ED Prescriptions    Medication Sig Dispense Auth. Provider   hydrOXYzine (ATARAX/VISTARIL) 25 MG tablet Take 1 tablet (25 mg total) by mouth every 6 (six) hours. 12 tablet Shantel Helwig A, NP   albendazole (ALBENZA) 200 MG tablet Take 2 tablets (400 mg total) by mouth 2 (two) times daily. 4 tablet Birgit Nowling A, NP   permethrin (ELIMITE) 5 % cream Apply to affected area once 60 g Loura Halt A, NP     Controlled Substance Prescriptions [place] Controlled Substance Registry consulted? Not Applicable   Orvan July, NP 12/09/18 [phone]

## 2018-12-17 ENCOUNTER — Encounter: Payer: Self-pay | Admitting: Emergency Medicine

## 2018-12-17 ENCOUNTER — Other Ambulatory Visit: Payer: Self-pay

## 2018-12-17 ENCOUNTER — Ambulatory Visit
Admission: EM | Admit: 2018-12-17 | Discharge: 2018-12-17 | Disposition: A | Discharge status: Home / Self Care | Payer: Medicaid Other | Attending: Physician Assistant | Admitting: Physician Assistant

## 2018-12-17 DIAGNOSIS — N898 Other specified noninflammatory disorders of vagina: Secondary | ICD-10-CM | POA: Diagnosis not present

## 2018-12-17 DIAGNOSIS — R21 Rash and other nonspecific skin eruption: Secondary | ICD-10-CM | POA: Insufficient documentation

## 2018-12-17 MED ORDER — PERMETHRIN 5 % EX CREA: TOPICAL_CREAM | CUTANEOUS | 0 refills | Status: DC

## 2018-12-17 MED ORDER — PREDNISONE 50 MG PO TABS: 50.0000 mg | ORAL_TABLET | Freq: Every day | ORAL | 0 refills | Status: DC

## 2018-12-17 MED ORDER — NYSTATIN 100000 UNIT/GM EX CREA: TOPICAL_CREAM | CUTANEOUS | 0 refills | Status: DC

## 2018-12-17 MED ORDER — ALBENDAZOLE 200 MG PO TABS: ORAL_TABLET | ORAL | 0 refills | Status: DC

## 2018-12-17 NOTE — ED Notes (Signed)
Patient on phone during triage.  Asked this RN to return.

## 2018-12-17 NOTE — ED Notes (Signed)
Patient able to ambulate independently  

## 2018-12-17 NOTE — ED Triage Notes (Signed)
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  Pt states she feels like things are crawling around near her rectum and her vaginal area.  States she also developed a rash between her legs which is abnormal for her.  Pt states she is taking Albendazole, and finished 3/10-3/11.  States she also was started on Hydroxyzine for insomnia issues.

## 2018-12-17 NOTE — ED Provider Notes (Signed)
[facility]    CSN: [id number] Arrival date & time: 12/17/18  1731     History   Chief Complaint No chief complaint on file.   HPI Ashley Stevens is a 43 y.o. female.   43 year old female comes in for continued itching and crawling sensation around her rectum and vaginal area. She was seen 3/6 and given Albendazole without relief. She states the itching is worse at night time, and feels that she saw "eggs" when she wiped this morning. She is also worried about scabies with rash that has not resolved after permethrin. However, she had used permethrin only on her rash instead of the whole body. States this all started after sleeping over at a clients house. She denies any new contacts, though has been trying to clean the groin area more often due to current symptoms. Patient with history of cocaine use, denies current use. She is not on seroquel at this time.      Past Medical History:  Diagnosis Date  . Anxiety   . Depression   . Shingles   . Suicidal ideations     Patient Active Problem List   Diagnosis Date Noted  . MDD (major depressive disorder), recurrent severe, without psychosis ([place]) 11/09/2017  . Cocaine abuse with cocaine-induced mood disorder ([place]) 11/03/2016  . Cocaine use disorder, moderate, dependence ([place]) 05/07/2016  . Cannabis use disorder, moderate, dependence ([place]) 05/07/2016    Past Surgical History:  Procedure Laterality Date  . c-section      OB History   No obstetric history on file.      Home Medications    Prior to Admission medications   Medication Sig Start Date End Date Taking? Authorizing Provider  albendazole [facility]) 200 MG tablet Once on 3/20 12/17/18   Cathlean Sauer V, PA-C  erythromycin ophthalmic ointment Place a 1/2 inch ribbon of ointment into the lower eyelid. 06/12/18   Horton, Barbette Hair, MD  gabapentin (NEURONTIN) 300 MG capsule Take 2 capsules (600 mg total) by mouth at bedtime. 11/15/17   Money, Lowry Ram, FNP   gabapentin (NEURONTIN) 300 MG capsule Take 1 capsule (300 mg total) by mouth 2 (two) times daily. 11/15/17   Money, Lowry Ram, FNP  hydrOXYzine (ATARAX/VISTARIL) 25 MG tablet Take 1 tablet (25 mg total) by mouth every 6 (six) hours. 12/06/18   Loura Halt A, NP  lansoprazole (PREVACID) 30 MG capsule Take 1 capsule (30 mg total) by mouth daily at 12 noon. 06/16/18   Julianne Rice, MD  nystatin cream (MYCOSTATIN) Apply to affected area 2 times daily for 7 days 12/17/18   Ok Edwards, PA-C  permethrin (ELIMITE) 5 % cream Thoroughly massage cream from head to soles of feet; leave on for 8 to 14 hours before removing (shower or bath) 12/17/18   Tasia Catchings, Subhan Hoopes V, PA-C  predniSONE (DELTASONE) 50 MG tablet Take 1 tablet (50 mg total) by mouth daily with breakfast. 12/17/18   Tasia Catchings, Jerald Hennington V, PA-C  QUEtiapine (SEROQUEL) 300 MG tablet Take 300 mg by mouth at bedtime.    [provider]  sertraline (ZOLOFT) 50 MG tablet Take 3 tablets (150 mg total) by mouth daily. For mood control 11/16/17   Money, Lowry Ram, FNP    Family History Family History  Problem Relation Age of Onset  . Heart failure Mother   . Kidney failure Brother   . Diabetes Other   . Hypertension Other     Social History Social History  Tobacco Use  . Smoking status: Current Every Day Smoker    Packs/day: 1.00    Types: Cigarettes  . Smokeless tobacco: Never Used  Substance Use Topics  . Alcohol use: No  . Drug use: Yes    Types: Marijuana, Cocaine    Comment: Pt denies     Allergies   Ibuprofen; Percocet [oxycodone-acetaminophen]; and Tylenol [acetaminophen]   Review of Systems Review of Systems  Reason unable to perform ROS: See HPI as above.     Physical Exam Triage Vital Signs ED Triage Vitals [12/17/18 1756]  Enc Vitals Group     BP (!) 133/96     Pulse Rate (!) 108     Resp 16     Temp 98.2 F (36.8 C)     Temp src      SpO2 98 %     Weight      Height      Head Circumference      Peak Flow      Pain Score       Pain Loc      Pain Edu?      Excl. in [place]?    No data found.  Updated Vital Signs BP (!) 133/96 (BP Location: Left Arm)   Pulse (!) 108   Temp 98.2 F (36.8 C)   Resp 16   LMP 12/06/2018   SpO2 98%   Physical Exam Exam conducted with a chaperone present.  Constitutional:      General: She is not in acute distress.    Appearance: She is well-developed. She is not diaphoretic.  HENT:     Head: Normocephalic and atraumatic.  Eyes:     Conjunctiva/sclera: Conjunctivae normal.     Pupils: Pupils are equal, round, and reactive to light.  Genitourinary:    Comments: Erythema around vulva, rectum area. No obvious satellite lesion. No warm. No tenderness to palpation. No worms/foreign body seen. Mild vaginal discharge noted. No odor.  Neurological:     Mental Status: She is alert and oriented to person, place, and time.      UC Treatments / Results  Labs (all labs ordered are listed, but only abnormal results are displayed) Labs Reviewed  CERVICOVAGINAL ANCILLARY ONLY    EKG None  Radiology No results found.  Procedures Procedures (including critical care time)  Medications Ordered in UC Medications - No data to display  Initial Impression / Assessment and Plan / UC Course  I have reviewed the triage vital signs and the nursing notes.  Pertinent labs & imaging results that were available during my care of the patient were reviewed by me and considered in my medical decision making (see chart for details).    ? Fungal infection with rash to the vulva and perirectal area. Will have patient use nystatin as directed. Given patient still with itching worse at night, and feels that she sees worms/eggs, will repeat albendazole on 3/20 to have the second dose 2 weeks after first dose. Will repeat permethrin treatment to whole body and take prednisone to help with itching. Cytology obtained. Return precautions given. Otherwise, patient to follow up with PCP for further  evaluation and management needed.   Final Clinical Impressions(s) / UC Diagnoses   Final diagnoses:  Rash  Vaginal discharge   ED Prescriptions    Medication Sig Dispense Auth. Provider   permethrin (ELIMITE) 5 % cream Thoroughly massage cream from head to soles of feet; leave on for 8 to 14 hours  before removing (shower or bath) 60 g Emiyah Spraggins V, PA-C   predniSONE (DELTASONE) 50 MG tablet Take 1 tablet (50 mg total) by mouth daily with breakfast. 5 tablet Jekhi Bolin V, PA-C   albendazole (ALBENZA) 200 MG tablet Once on 3/20 2 tablet Valena Ivanov V, PA-C   nystatin cream (MYCOSTATIN) Apply to affected area 2 times daily for 7 days 15 g Tobin Chad, [place] 12/17/18 1904

## 2018-12-17 NOTE — Discharge Instructions (Addendum)
Start permethrin and prednisone as directed. Nystatin on affected vaginal/rectum area for possible fungal infection. You can take another dose of albendazole on 3/20 to cover for pinworms if needed. Cytology swab sent, you will be contacted with any positive results. Follow up with PCP for further evaluation if symptoms still not improving.

## 2018-12-17 NOTE — ED Notes (Signed)
During discharge patient requesting oral medication to treat yeast as well.  Will make APP aware so she can speak to patient again.

## 2018-12-20 ENCOUNTER — Telehealth (HOSPITAL_COMMUNITY): Payer: Self-pay | Admitting: Emergency Medicine

## 2018-12-20 LAB — CERVICOVAGINAL ANCILLARY ONLY
Bacterial vaginitis: POSITIVE — AB
Candida vaginitis: NEGATIVE
Chlamydia: NEGATIVE
Neisseria Gonorrhea: NEGATIVE
TRICH (WINDOWPATH): NEGATIVE

## 2018-12-20 MED ORDER — METRONIDAZOLE 500 MG PO TABS: 500.0000 mg | ORAL_TABLET | Freq: Two times a day (BID) | ORAL | 0 refills | Status: DC

## 2018-12-20 NOTE — Telephone Encounter (Signed)
Bacterial vaginosis is positive. This was not treated at the urgent care visit.  Flagyl 500 mg BID x 7 days #14 no refills sent to patients pharmacy of choice.    Attempted to reach patient. No answer at this time. Mailbox full.   

## 2018-12-20 NOTE — Telephone Encounter (Signed)
Patient contacted and made aware of all results, all questions answered.   

## 2018-12-27 ENCOUNTER — Inpatient Hospital Stay (HOSPITAL_COMMUNITY)
Admission: RE | Admit: 2018-12-27 | Discharge: 2019-01-01 | DRG: 885 | Disposition: A | Discharge status: Home / Self Care | Payer: Medicaid Other | Attending: Psychiatry | Admitting: Psychiatry

## 2018-12-27 ENCOUNTER — Other Ambulatory Visit: Payer: Self-pay

## 2018-12-27 ENCOUNTER — Encounter (HOSPITAL_COMMUNITY): Payer: Self-pay | Admitting: Clinical

## 2018-12-27 DIAGNOSIS — F322 Major depressive disorder, single episode, severe without psychotic features: Secondary | ICD-10-CM | POA: Diagnosis present

## 2018-12-27 DIAGNOSIS — Z590 Homelessness: Secondary | ICD-10-CM | POA: Diagnosis not present

## 2018-12-27 DIAGNOSIS — Z888 Allergy status to other drugs, medicaments and biological substances status: Secondary | ICD-10-CM

## 2018-12-27 DIAGNOSIS — F603 Borderline personality disorder: Secondary | ICD-10-CM | POA: Diagnosis present

## 2018-12-27 DIAGNOSIS — F332 Major depressive disorder, recurrent severe without psychotic features: Principal | ICD-10-CM | POA: Diagnosis present

## 2018-12-27 DIAGNOSIS — Z818 Family history of other mental and behavioral disorders: Secondary | ICD-10-CM | POA: Diagnosis not present

## 2018-12-27 DIAGNOSIS — F1420 Cocaine dependence, uncomplicated: Secondary | ICD-10-CM | POA: Diagnosis present

## 2018-12-27 DIAGNOSIS — B80 Enterobiasis: Secondary | ICD-10-CM | POA: Diagnosis present

## 2018-12-27 DIAGNOSIS — R45851 Suicidal ideations: Secondary | ICD-10-CM | POA: Diagnosis present

## 2018-12-27 DIAGNOSIS — Z23 Encounter for immunization: Secondary | ICD-10-CM

## 2018-12-27 DIAGNOSIS — G4700 Insomnia, unspecified: Secondary | ICD-10-CM | POA: Diagnosis present

## 2018-12-27 DIAGNOSIS — F17210 Nicotine dependence, cigarettes, uncomplicated: Secondary | ICD-10-CM | POA: Diagnosis present

## 2018-12-27 DIAGNOSIS — F419 Anxiety disorder, unspecified: Secondary | ICD-10-CM | POA: Diagnosis present

## 2018-12-27 DIAGNOSIS — B86 Scabies: Secondary | ICD-10-CM | POA: Diagnosis present

## 2018-12-27 DIAGNOSIS — Z915 Personal history of self-harm: Secondary | ICD-10-CM

## 2018-12-27 DIAGNOSIS — R450 Nervousness: Secondary | ICD-10-CM | POA: Diagnosis not present

## 2018-12-27 MED ORDER — INFLUENZA VAC SPLIT QUAD 0.5 ML IM SUSY
0.5000 mL | PREFILLED_SYRINGE | INTRAMUSCULAR | Status: AC
  Administered 2018-12-28: 0.5 mL via INTRAMUSCULAR
  Filled 2018-12-27: qty 0.5

## 2018-12-27 MED ORDER — PNEUMOCOCCAL VAC POLYVALENT 25 MCG/0.5ML IJ INJ
0.5000 mL | INJECTION | INTRAMUSCULAR | Status: AC
  Administered 2018-12-28: 0.5 mL via INTRAMUSCULAR

## 2018-12-27 MED ORDER — ALUM & MAG HYDROXIDE-SIMETH 200-200-20 MG/5ML PO SUSP
30.0000 mL | ORAL | Status: DC | PRN
  Administered 2018-12-29 – 2018-12-31 (×4): 30 mL via ORAL
  Filled 2018-12-27 (×4): qty 30

## 2018-12-27 MED ORDER — TRAZODONE HCL 50 MG PO TABS
50.0000 mg | ORAL_TABLET | Freq: Every evening | ORAL | Status: DC | PRN
  Administered 2018-12-28 – 2018-12-31 (×4): 50 mg via ORAL
  Filled 2018-12-27 (×4): qty 1

## 2018-12-27 MED ORDER — HYDROXYZINE HCL 25 MG PO TABS
25.0000 mg | ORAL_TABLET | Freq: Three times a day (TID) | ORAL | Status: DC | PRN
  Administered 2018-12-27 – 2018-12-31 (×6): 25 mg via ORAL
  Filled 2018-12-27 (×7): qty 1

## 2018-12-27 MED ORDER — MAGNESIUM HYDROXIDE 400 MG/5ML PO SUSP
30.0000 mL | Freq: Every day | ORAL | Status: DC | PRN
  Administered 2018-12-29 – 2018-12-30 (×2): 30 mL via ORAL
  Filled 2018-12-27 (×2): qty 30

## 2018-12-27 NOTE — BH Assessment (Signed)
Assessment Note  Ashley Stevens is an 43 y.o. female presenting voluntarily to [facility] for assessment complaining of suicidal ideation and recent relapse on crack cocaine. Patient reports worsening depressive symptoms the past 3 weeks. She endorses hopelessness, worthlessness, insomnia, anhedonia, guilt, social isolation, fatigue, and irritability. Patient reports SI with a plan to overdose. She reports 1 prior suicide attempt in 2017. She denies HI/AVH. Patient identifies several life stressors including homelessness after losing home to foreclosure, children being in kinship placement, and recent break up with her boyfriend. Patient reports losing her sibling, mother, and father. She also states that she relapsed on crack cocaine 3/22 after over 1 month of sobriety. She states she began using cocaine in 2017. She reports daily use until February 2020. She reports sleeping less than 1 hour per night and poor appetite. Patient denies any other substance use. Patient denies any current criminal charges or access to firearms.   Patient is alert and oriented x 4. She is dressed appropriately and tearful during assessment. Patient's speech is logical, her thoughts are organized, and she makes appropriate eye contact. Patient's mood is depressed and affect is congruent. Patient has fair insight but poor judgement and impulse control. Patient does not appear to be responding to internal stimuli or experiencing delusional thoughts content.  Diagnosis: F33.2 MDD, recurrent, severe   F14.20 Cocaine use disorder, severe  Past Medical History:  Past Medical History:  Diagnosis Date  . Anxiety   . Depression   . Shingles   . Suicidal ideations     Past Surgical History:  Procedure Laterality Date  . c-section      Family History:  Family History  Problem Relation Age of Onset  . Heart failure Mother   . Kidney failure Brother   . Diabetes Other   . Hypertension Other     Social History:  reports  that she has been smoking cigarettes. She has been smoking about 1.00 pack per day. She has never used smokeless tobacco. She reports current drug use. Drugs: Marijuana and Cocaine. She reports that she does not drink alcohol.  Additional Social History:  Alcohol / Drug Use Pain Medications: see MAR Prescriptions: see MAR Over the Counter: see MAR History of alcohol / drug use?: Yes Substance #1 Name of Substance 1: crack cocaine 1 - Age of First Use: 39 1 - Amount (size/oz): varies 1 - Frequency: daily 1 - Duration: 3 years 1 - Last Use / Amount: 12/22/2018- unknown amount  CIWA:   COWS:    Allergies:  Allergies  Allergen Reactions  . Ibuprofen   . Percocet [Oxycodone-Acetaminophen] Nausea And Vomiting  . Tylenol [Acetaminophen]     Home Medications: (Not in a hospital admission)   OB/GYN Status:  Patient's last menstrual period was 12/06/2018.  General Assessment Data Location of Assessment: [facility] Assessment Services TTS Assessment: In system Is this a Tele or Face-to-Face Assessment?: Face-to-Face Is this an Initial Assessment or a Re-assessment for this encounter?: Initial Assessment Patient Accompanied by:: N/A Language Other than English: No Living Arrangements: Homeless/Shelter What gender do you identify as?: Female Marital status: Single Maiden name: [place] Pregnancy Status: No Living Arrangements: (homeless) Can pt return to current living arrangement?: Yes Admission Status: Voluntary Is patient capable of signing voluntary admission?: Yes Referral Source: Self/Family/Friend Insurance type: Medicaid     Crisis Care Plan Living Arrangements: (homeless) Legal Guardian: (self) Name of Psychiatrist: Warden/ranger Name of Therapist: [facility]  Education Status Is patient currently in school?: No Is the  patient employed, unemployed or receiving disability?: Employed  Risk to self with the past 6 months Suicidal Ideation: Yes-Currently Present Has patient been  a risk to self within the past 6 months prior to admission? : Yes Suicidal Intent: Yes-Currently Present Has patient had any suicidal intent within the past 6 months prior to admission? : Yes Is patient at risk for suicide?: Yes Suicidal Plan?: Yes-Currently Present Has patient had any suicidal plan within the past 6 months prior to admission? : Yes Specify Current Suicidal Plan: overdose Access to Means: Yes Specify Access to Suicidal Means: prescriptions, illicit drugs What has been your use of drugs/alcohol within the last 12 months?: crack cocaine use Previous Attempts/Gestures: Yes How many times?: 1 Other Self Harm Risks: none noted Triggers for Past Attempts: Other (Comment)(loss) Intentional Self Injurious Behavior: None Family Suicide History: No Recent stressful life event(s): Loss (Comment), Conflict (Comment), Financial Problems(unable to get meds; homeless- house foreclosed; kids gone) Persecutory voices/beliefs?: No Depression: Yes Depression Symptoms: Despondent, Insomnia, Tearfulness, Isolating, Fatigue, Guilt, Loss of interest in usual pleasures, Feeling worthless/self pity, Feeling angry/irritable Substance abuse history and/or treatment for substance abuse?: Yes Suicide prevention information given to non-admitted patients: Not applicable  Risk to Others within the past 6 months Homicidal Ideation: No Does patient have any lifetime risk of violence toward others beyond the six months prior to admission? : No Thoughts of Harm to Others: No Current Homicidal Intent: No Current Homicidal Plan: No Access to Homicidal Means: No Identified Victim: none History of harm to others?: No Assessment of Violence: None Noted Violent Behavior Description: none noted Does patient have access to weapons?: No Criminal Charges Pending?: No Does patient have a court date: No Is patient on probation?: No  Psychosis Hallucinations: None noted Delusions: None noted  Mental  Status Report Appearance/Hygiene: Unremarkable Eye Contact: Good Motor Activity: Freedom of movement Speech: Logical/coherent Level of Consciousness: Crying Mood: Depressed Affect: Depressed Anxiety Level: Moderate Thought Processes: Coherent, Relevant Judgement: Impaired Orientation: Person, Place, Time, Situation Obsessive Compulsive Thoughts/Behaviors: None  Cognitive Functioning Concentration: Normal Memory: Recent Intact, Remote Intact Is patient IDD: No Insight: Fair Impulse Control: Poor Appetite: Poor Have you had any weight changes? : No Change Sleep: Decreased Total Hours of Sleep: 1 Vegetative Symptoms: Staying in bed  ADLScreening [facility] Assessment Services) Patient's cognitive ability adequate to safely complete daily activities?: Yes Patient able to express need for assistance with ADLs?: Yes Independently performs ADLs?: Yes (appropriate for developmental age)  Prior Inpatient Therapy Prior Inpatient Therapy: Yes Prior Therapy Dates: 2017, 2018, 2019 Prior Therapy Facilty/Provider(s): [facility] Reason for Treatment: depression, substance use  Prior Outpatient Therapy Prior Outpatient Therapy: Yes Prior Therapy Dates: ongoing Prior Therapy Facilty/Provider(s): [facility] Reason for Treatment: depression Does patient have an ACCT team?: No Does patient have Intensive In-House Services?  : No Does patient have [facility] services? : Yes Does patient have P4CC services?: No  ADL Screening (condition at time of admission) Patient's cognitive ability adequate to safely complete daily activities?: Yes Is the patient deaf or have difficulty hearing?: No Does the patient have difficulty seeing, even when wearing glasses/contacts?: No Does the patient have difficulty concentrating, remembering, or making decisions?: No Patient able to express need for assistance with ADLs?: Yes Does the patient have difficulty dressing or bathing?: No Independently  performs ADLs?: Yes (appropriate for developmental age) Does the patient have difficulty walking or climbing stairs?: No Weakness of Legs: None Weakness of Arms/Hands: None  Home Assistive Devices/Equipment Home Assistive  Devices/Equipment: None  Therapy Consults (therapy consults require a physician order) PT Evaluation Needed: No OT Evalulation Needed: No SLP Evaluation Needed: No Abuse/Neglect Assessment (Assessment to be complete while patient is alone) Abuse/Neglect Assessment Can Be Completed: Yes Physical Abuse: Denies Verbal Abuse: Denies Sexual Abuse: Denies Exploitation of patient/patient's resources: Denies Self-Neglect: Denies Values / Beliefs Cultural Requests During Hospitalization: None Spiritual Requests During Hospitalization: None Consults Spiritual Care Consult Needed: No Social Work Consult Needed: No Regulatory affairs officer (For Healthcare) Does Patient Have a Medical Advance Directive?: No Would patient like information on creating a medical advance directive?: No - Patient declined          Disposition: Priscille Loveless, NP recommends in patient. Patient accepted to [facility] [id number]. Disposition Initial Assessment Completed for this Encounter: Yes Disposition of Patient: Admit Type of inpatient treatment program: Adult Patient refused recommended treatment: No  On Site Evaluation by:   Reviewed with Physician:    Orvis Brill 12/27/2018 3:44 PM

## 2018-12-27 NOTE — Progress Notes (Signed)
Patient ID: Ashley Stevens, female   DOB: [birth year], 43 y.o.   MRN: [id number]  Skin assessment completed per admission protocol. Patient with reddened, moist skin to inner upper thighs. No bites or other rashes noted despite patient's report of previous skin infections and report of current pruritus. Patient hands are dry but skin is intact.

## 2018-12-27 NOTE — Progress Notes (Signed)
Patient ID: Ashley Stevens, female   DOB: 1976/05/14, 43 y.o.   MRN: [id number]  D: Pt alert and oriented during [facility] admission process. Pt denies SI/HI, A/VH, and any pain. Pt is cooperative on the unit.  "Ashley Stevens is an 43 y.o. female presenting voluntarily to [facility] for assessment complaining of suicidal ideation and recent relapse on crack cocaine. Patient reports worsening depressive symptoms the past 3 weeks. She endorses hopelessness, worthlessness, insomnia, anhedonia, guilt, social isolation, fatigue, and irritability. Patient reports SI with a plan to overdose. She reports 1 prior suicide attempt in 2017. She denies HI/AVH. Patient identifies several life stressors including homelessness after losing home to foreclosure, children being in kinship placement, and recent break up with her boyfriend. Patient reports losing her sibling, mother, and father. She also states that she relapsed on crack cocaine 3/22 after over 1 month of sobriety. She states she began using cocaine in 2017. She reports daily use until February 2020. She reports sleeping less than 1 hour per night and poor appetite. Patient denies any other substance use. Patient denies any current criminal charges or access to firearms."  A: Education, support, reassurance, and encouragement provided, q15 minute safety checks initiated. Pt's belongings in locker #33.    R: Pt denies any concerns at this time, and verbally contracts for safety. Pt ambulating on the unit with no issues. Pt remains safe on and off the unit.

## 2018-12-27 NOTE — Tx Team (Signed)
Initial Treatment Plan 12/27/2018 6:32 PM [name] [name]:[id number]    PATIENT STRESSORS: Financial difficulties Loss of parents and only sibling Marital or family conflict Substance abuse   PATIENT STRENGTHS: Ability for insight Curator fund of knowledge Motivation for treatment/growth   PATIENT IDENTIFIED PROBLEMS: "Not having a support system"  "Not being with my kids"  "Death of my parents and only sibling"  "Being hopeless"  "Substance Abuse"             DISCHARGE CRITERIA:  Adequate post-discharge living arrangements Improved stabilization in mood, thinking, and/or behavior Reduction of life-threatening or endangering symptoms to within safe limits Verbal commitment to aftercare and medication compliance  PRELIMINARY DISCHARGE PLAN: Outpatient therapy Return to previous living arrangement Return to previous work or school arrangements Referrals indicated:  housing  PATIENT/FAMILY INVOLVEMENT: This treatment plan has been presented to and reviewed with the patient, Ashley Stevens, and/or family member. The patient and family have been given the opportunity to ask questions and make suggestions.  Harriet Masson, RN 12/27/2018, 6:32 PM

## 2018-12-27 NOTE — H&P (Signed)
Behavioral Health Medical Screening Exam  Ashley Stevens is an 43 y.o. female with worsening depression, suicidal ideations with a plan to overdose on medication. She states her symptoms of depression include apathy, guilty, worthless, insomnia. She states her suicidal thoughts have worsened over the past two days, as she is hopeless and has no one in her support system. She reports a history of previous suicide attempt by overdose that required no medical intervention however she was admitted to [facility] for depression.  Patient also endorse relapsing on cocaine this past Sunday after a two years of sobriety. She states her previous ex boyfriend would not stop using cocaine, and as a result they had a recent breakup. She reports a history of depression, bipolar, anxiety, and polysubstance, and was treated with anti-depressant. She reports previous [facility], however has not been able to get re-established. She has contacted [facility] for additional resources to include ACT team services which she does not qualify for at this time. Patient is tearful throughout the exam and continues to endorse passive suicidal ideations due to sense of failure and hopelessness.   Total Time spent with patient: 20 minutes  Psychiatric Specialty Exam: Physical Exam  Nursing note and vitals reviewed. Constitutional: She is oriented to person, place, and time. She appears well-developed and well-nourished.  HENT:  Head: Normocephalic.  Musculoskeletal: Normal range of motion.  Neurological: She is alert and oriented to person, place, and time.    Review of Systems  Psychiatric/Behavioral: Positive for depression, hallucinations (tactile stimuli of parasites and worms), substance abuse (recent relapse of cocaine x1 dau) and suicidal ideas (recent relapse of cocaine x1). Negative for memory loss. The patient is nervous/anxious and has insomnia.   All other systems reviewed and are negative.   Last  menstrual period 12/06/2018.There is no height or weight on file to calculate BMI.  General Appearance: Fairly Groomed  Eye Contact:  Fair  Speech:  Clear and Coherent and Pressured  Volume:  Normal  Mood:  Anxious, Dysphoric, Hopeless and Worthless  Affect:  Congruent and Tearful  Thought Process:  Coherent, Linear and Descriptions of Associations: Circumstantial  Orientation:  Full (Time, Place, and Person)  Thought Content:  Logical and rumination about parasites that she acquired at [facility] [employer].    Suicidal Thoughts:  Yes.  without intent/plan  Homicidal Thoughts:  No  Memory:  Immediate;   Fair Recent;   Poor  Judgement:  Poor  Insight:  Lacking  Psychomotor Activity:  Normal  Concentration: Concentration: Fair and Attention Span: Fair  Recall:  [employer] of Knowledge:Fair  Language: Fair  Akathisia:  No  Handed:  Right  AIMS (if indicated):     Assets:  Communication Skills Desire for Improvement Financial Resources/Insurance Physical Health  Sleep:       Musculoskeletal: Strength & Muscle Tone: within normal limits Gait & Station: normal Patient leans: N/A  Last menstrual period 12/06/2018.  Recommendations:  Based on my evaluation the patient does not appear to have an emergency medical condition.   Admission Assessment   [facility] Admission Suicide Risk Assessment   Nursing information obtained from:   Patient Demographic factors:   homeless, employed at [employer] Current Mental Status:   unstable Loss Factors:    home, children and significant others Historical Factors:   previous history of suicide attempt, previous under the care of psychiatrist however has been off medication due to insurance refusing to pay.  Risk Reduction Factors:   Sense of  responsibilites to others, children  Total Time spent with patient: 30 minutes Principal Problem: MDD (major depressive disorder), recurrent severe, without psychosis ([place]) Diagnosis:  Principal Problem:    MDD (major depressive disorder), recurrent severe, without psychosis ([place])   CLINICAL FACTORS:   Bipolar Disorder:   Depressive phase Alcohol/Substance Abuse/Dependencies More than one psychiatric diagnosis Unstable or Poor Therapeutic Relationship Previous Psychiatric Diagnoses and Treatments  COGNITIVE FEATURES THAT CONTRIBUTE TO RISK:  None    SUICIDE RISK:   Moderate:  Frequent suicidal ideation with limited intensity, and duration, some specificity in terms of plans, no associated intent, good self-control, limited dysphoria/symptomatology, some risk factors present, and identifiable protective factors, including available and accessible social support.  PLAN OF CARE:   I certify that inpatient services furnished can reasonably be expected to improve the patient's condition.   Treatment Plan/Recommendations:   Review of chart, vital signs, medications, and notes.  Will admit inpatient for crisis stabilization and medication management. Patient to be placed in private room due to ongoing treatment for scabies. Will need to closely monitor for psychosis and tactile stimuli, patient continues to ruminate about "parasites and feeling worms, scabies" As documented per chart review she reported ongoing eggs and worms with bowel movements however this was not identified during genitourinary exam.  1-Medication management for depression and anxiety: Medications reviewed with the patient and she stated no untoward effects, unchanged. 2-Coping skills for depression, anxiety  3-Continue crisis stabilization and management  4-Address health issues--monitoring vital signs, stable  5-Treatment plan in progress to prevent relapse of depression and anxiety   Suella Broad, FNP 12/27/2018, 3:27 PM

## 2018-12-28 DIAGNOSIS — F332 Major depressive disorder, recurrent severe without psychotic features: Principal | ICD-10-CM

## 2018-12-28 LAB — COMPREHENSIVE METABOLIC PANEL
ALT: 10 U/L (ref 0–44)
AST: 17 U/L (ref 15–41)
Albumin: 3.2 g/dL — ABNORMAL LOW (ref 3.5–5.0)
Alkaline Phosphatase: 60 U/L (ref 38–126)
Anion gap: 7 (ref 5–15)
BUN: 12 mg/dL (ref 6–20)
CO2: 21 mmol/L — ABNORMAL LOW (ref 22–32)
Calcium: 8.9 mg/dL (ref 8.9–10.3)
Chloride: 109 mmol/L (ref 98–111)
Creatinine, Ser: 1 mg/dL (ref 0.44–1.00)
GFR calc Af Amer: >60 mL/min (ref >60)
GFR calc non Af Amer: >60 mL/min (ref >60)
Glucose, Bld: 97 mg/dL (ref 70–99)
Potassium: 4.3 mmol/L (ref 3.5–5.1)
Sodium: 137 mmol/L (ref 135–145)
Total Bilirubin: 0.4 mg/dL (ref 0.3–1.2)
Total Protein: 6 g/dL — ABNORMAL LOW (ref 6.5–8.1)

## 2018-12-28 LAB — RAPID URINE DRUG SCREEN, HOSP PERFORMED
Amphetamines: NOT DETECTED
Barbiturates: NOT DETECTED
Benzodiazepines: NOT DETECTED
Cocaine: NOT DETECTED
Opiates: NOT DETECTED
Tetrahydrocannabinol: NOT DETECTED

## 2018-12-28 LAB — CBC
HCT: 41.4 % (ref 36.0–46.0)
Hemoglobin: 12.6 g/dL (ref 12.0–15.0)
MCH: 26 pg (ref 26.0–34.0)
MCHC: 30.4 g/dL (ref 30.0–36.0)
MCV: 85.4 fL (ref 80.0–100.0)
Platelets: 375 10*3/uL (ref 150–400)
RBC: 4.85 MIL/uL (ref 3.87–5.11)
RDW: 15.3 % (ref 11.5–15.5)
WBC: 10.7 10*3/uL — AB (ref 4.0–10.5)
nRBC: 0 % (ref 0.0–0.2)

## 2018-12-28 LAB — LIPID PANEL
Cholesterol: 156 mg/dL (ref 0–200)
HDL: 43 mg/dL
LDL Cholesterol: 99 mg/dL (ref 0–99)
Total CHOL/HDL Ratio: 3.6 ratio
Triglycerides: 69 mg/dL
VLDL: 14 mg/dL (ref 0–40)

## 2018-12-28 LAB — TSH: TSH: 1.273 u[IU]/mL (ref 0.350–4.500)

## 2018-12-28 LAB — PREGNANCY, URINE: Preg Test, Ur: NEGATIVE

## 2018-12-28 MED ORDER — QUETIAPINE FUMARATE 100 MG PO TABS
100.0000 mg | ORAL_TABLET | Freq: Every day | ORAL | Status: DC
  Administered 2018-12-28: 100 mg via ORAL
  Filled 2018-12-28 (×3): qty 1

## 2018-12-28 MED ORDER — ALBENDAZOLE 200 MG PO TABS: 400.0000 mg | ORAL_TABLET | Freq: Two times a day (BID) | ORAL | Status: DC

## 2018-12-28 MED ORDER — SERTRALINE HCL 50 MG PO TABS
50.0000 mg | ORAL_TABLET | Freq: Every day | ORAL | Status: DC
  Administered 2018-12-28 – 2018-12-30 (×3): 50 mg via ORAL
  Filled 2018-12-28 (×5): qty 1

## 2018-12-28 MED ORDER — PERMETHRIN 5 % EX CREA: TOPICAL_CREAM | Freq: Once | CUTANEOUS | Status: DC

## 2018-12-28 NOTE — H&P (Signed)
Psychiatric Admission Assessment Adult  Patient Identification: Ashley Stevens MRN:  [id number] Date of Evaluation:  12/28/2018 Chief Complaint:  DEPRESSION Principal Diagnosis: MDD (major depressive disorder), recurrent severe, without psychosis ([place]) Diagnosis:  Principal Problem:   MDD (major depressive disorder), recurrent severe, without psychosis ([place]) Active Problems:   MDD (major depressive disorder), severe ([place])  History of Present Illness: Patient is a 43 year old female with a reported past psychiatric history significant for major depression as well as cocaine dependence who presented to the behavioral health hospital on 12/27/2018 with worsening depression and suicidal ideation.  The patient stated that she has been sober of cocaine x1 month.  She stated that despite this she is having significant psychosocial stressors which is worsening her depressive symptoms.  She stated she is homeless, and that is unable to continue her job because of the coronavirus.  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  Her homelessness as well as the lack of a computer has limited her there.  She has most recently been seen by an organization in the community to assist with her depressive symptoms and psychiatric care.  She was seen there and it was determined that she would be a decent candidate for a ACTT service.  Unfortunately the local mental health authority did not authorize that.  She stated that because of the fact that she is alone, she has all her family members who have passed, and that she has essentially no support that she needs additional assistance.  She admitted to helplessness, hopelessness and worthlessness as well as suicidal ideation.  Her last psychiatric hospitalization was approximately 1 year ago.  She stated that she was admitted to some [facility] service as an  inpatient for several days in October last year.  She has been previously treated with Seroquel and Zoloft which apparently has helped.  She was admitted to the hospital for evaluation and stabilization.  Associated Signs/Symptoms: Depression Symptoms:  depressed mood, anhedonia, insomnia, psychomotor agitation, fatigue, feelings of worthlessness/guilt, difficulty concentrating, hopelessness, suicidal thoughts without plan, anxiety, loss of energy/fatigue, disturbed sleep, (Hypo) Manic Symptoms:  Impulsivity, Irritable Mood, Labiality of Mood, Anxiety Symptoms:  Excessive Worry, Psychotic Symptoms:  denied PTSD Symptoms: Negative Total Time spent with patient: 30 minutes  Past Psychiatric History: The patient has previous psychiatric admissions at behavioral health hospital as well as [facility] for depression and substance abuse.  She also has one previous residential program at [place].  Is the patient at risk to self? Yes.    Has the patient been a risk to self in the past 6 months? Yes.    Has the patient been a risk to self within the distant past? Yes.    Is the patient a risk to others? No.  Has the patient been a risk to others in the past 6 months? No.  Has the patient been a risk to others within the distant past? No.   Prior Inpatient Therapy: Prior Inpatient Therapy: Yes Prior Therapy Dates: 2017, 2018, 2019 Prior Therapy Facilty/Provider(s): [facility], [facility] Reason for Treatment: depression, substance use Prior Outpatient Therapy: Prior Outpatient Therapy: Yes Prior Therapy Dates: ongoing Prior Therapy Facilty/Provider(s): [facility] Reason for Treatment: depression Does patient have an ACCT team?: No Does patient have Intensive In-House Services?  : No Does patient have [facility] services? : Yes Does patient have P4CC services?: No  Alcohol  Screening: 1. How often do you have a drink containing alcohol?: Never 2. How many drinks containing  alcohol do you have on a typical day when you are drinking?: 1 or 2 3. How often do you have six or more drinks on one occasion?: Never AUDIT-C Score: 0 Alcohol Brief Interventions/Follow-up: Alcohol Education, AUDIT Score <7 follow-up not indicated Substance Abuse History in the last 12 months:  Yes.   Consequences of Substance Abuse: Legal Consequences:  She has had legal charges on her in the past.  On her hospitalization in 2019 she was either pending a court date or on parole. Family Consequences:  She has essentially no family support. Previous Psychotropic Medications: Yes  Psychological Evaluations: Yes  Past Medical History:  Past Medical History:  Diagnosis Date  . Anxiety   . Depression   . Shingles   . Suicidal ideations     Past Surgical History:  Procedure Laterality Date  . c-section     Family History:  Family History  Problem Relation Age of Onset  . Heart failure Mother   . Kidney failure Brother   . Diabetes Other   . Hypertension Other    Family Psychiatric  History: Mother had depression and died at 3.  She also has a brother who is passed away. Tobacco Screening: Have you used any form of tobacco in the last 30 days? (Cigarettes, Smokeless Tobacco, Cigars, and/or Pipes): Yes Tobacco use, Select all that apply: smokeless tobacco use daily Are you interested in Tobacco Cessation Medications?: Yes, will notify MD for an order Counseled patient on smoking cessation including recognizing danger situations, developing coping skills and basic information about quitting provided: Yes Social History:  Social History   Substance and Sexual Activity  Alcohol Use No     Social History   Substance and Sexual Activity  Drug Use Yes  . Types: Marijuana, Cocaine   Comment: Pt denies    Additional Social History: Marital status: Separated Separated, when?: Since 06/17/2011 What types of issues is patient dealing with in the relationship?: Husband was drug  user Are you sexually active?: No What is your sexual orientation?: heterosexual Has your sexual activity been affected by drugs, alcohol, medication, or emotional stress?: emotional stress Does patient have children?: Yes How many children?: 3 How is patient's relationship with their children?: Pretty good    Pain Medications: see MAR Prescriptions: see MAR Over the Counter: see MAR History of alcohol / drug use?: Yes Name of Substance 1: crack cocaine 1 - Age of First Use: 39 1 - Amount (size/oz): varies 1 - Frequency: daily 1 - Duration: 3 years 1 - Last Use / Amount: 12/22/2018- unknown amount                  Allergies:   Allergies  Allergen Reactions  . Ibuprofen   . Percocet [Oxycodone-Acetaminophen] Nausea And Vomiting  . Tylenol [Acetaminophen]    Lab Results:  Results for orders placed or performed during the hospital encounter of 12/27/18 (from the past 48 hour(s))  Pregnancy, urine     Status: None   Collection Time: 12/27/18  7:02 PM  Result Value Ref Range   Preg Test, Ur NEGATIVE NEGATIVE    Comment:        THE SENSITIVITY OF THIS METHODOLOGY IS >20 mIU/mL. Performed at [facility], [place] [address], [place], [place] [address]   Urine rapid drug screen (hosp performed)not at [facility]     Status: None  Collection Time: 12/27/18  7:02 PM  Result Value Ref Range   Opiates NONE DETECTED NONE DETECTED   Cocaine NONE DETECTED NONE DETECTED   Benzodiazepines NONE DETECTED NONE DETECTED   Amphetamines NONE DETECTED NONE DETECTED   Tetrahydrocannabinol NONE DETECTED NONE DETECTED   Barbiturates NONE DETECTED NONE DETECTED    Comment: (NOTE) DRUG SCREEN FOR MEDICAL PURPOSES ONLY.  IF CONFIRMATION IS NEEDED FOR ANY PURPOSE, NOTIFY LAB WITHIN 5 DAYS. LOWEST DETECTABLE LIMITS FOR URINE DRUG SCREEN Drug Class                     Cutoff (ng/mL) Amphetamine and metabolites    1000 Barbiturate and metabolites    200 Benzodiazepine                  353 Tricyclics and metabolites     300 Opiates and metabolites        300 Cocaine and metabolites        300 THC                            50 Performed at [facility], [place] [address] [place], [place] [address]   CBC     Status: Abnormal   Collection Time: 12/28/18  6:19 AM  Result Value Ref Range   WBC 10.7 (H) 4.0 - 10.5 K/uL   RBC 4.85 3.87 - 5.11 MIL/uL   Hemoglobin 12.6 12.0 - 15.0 g/dL   HCT 41.4 36.0 - 46.0 %   MCV 85.4 80.0 - 100.0 fL   MCH 26.0 26.0 - 34.0 pg   MCHC 30.4 30.0 - 36.0 g/dL   RDW 15.3 11.5 - 15.5 %   Platelets 375 150 - 400 K/uL   nRBC 0.0 0.0 - 0.2 %    Comment: Performed at [facility], [place] [address], [place], [place] [address]  Comprehensive metabolic panel     Status: Abnormal   Collection Time: 12/28/18  6:19 AM  Result Value Ref Range   Sodium 137 135 - 145 mmol/L   Potassium 4.3 3.5 - 5.1 mmol/L   Chloride 109 98 - 111 mmol/L   CO2 21 (L) 22 - 32 mmol/L   Glucose, Bld 97 70 - 99 mg/dL   BUN 12 6 - 20 mg/dL   Creatinine, Ser 1.00 0.44 - 1.00 mg/dL   Calcium 8.9 8.9 - 10.3 mg/dL   Total Protein 6.0 (L) 6.5 - 8.1 g/dL   Albumin 3.2 (L) 3.5 - 5.0 g/dL   AST 17 15 - 41 U/L   ALT 10 0 - 44 U/L   Alkaline Phosphatase 60 38 - 126 U/L   Total Bilirubin 0.4 0.3 - 1.2 mg/dL   GFR calc non Af Amer >60 >60 mL/min   GFR calc Af Amer >60 >60 mL/min   Anion gap 7 5 - 15    Comment: Performed at [facility], [place] [address], [place],  [address]  Lipid panel     Status: None   Collection Time: 12/28/18  6:19 AM  Result Value Ref Range   Cholesterol 156 0 - 200 mg/dL   Triglycerides 69 <150 mg/dL   HDL 43 >40 mg/dL   Total CHOL/HDL Ratio 3.6 RATIO   VLDL 14 0 - 40 mg/dL   LDL Cholesterol 99 0 - 99 mg/dL    Comment:        Total Cholesterol/HDL:CHD Risk  Coronary Heart Disease Risk Table                     Men   Women  1/2 Average Risk   3.4   3.3  Average Risk       5.0   4.4   2 X Average Risk   9.6   7.1  3 X Average Risk  23.4   11.0        Use the calculated Patient Ratio above and the CHD Risk Table to determine the patient's CHD Risk.        ATP III CLASSIFICATION (LDL):  <100     mg/dL   Optimal  100-129  mg/dL   Near or Above                    Optimal  130-159  mg/dL   Borderline  160-189  mg/dL   High  >190     mg/dL   Very High Performed at [place] [address], [place], [place] [address]   TSH     Status: None   Collection Time: 12/28/18  6:19 AM  Result Value Ref Range   TSH 1.273 0.350 - 4.500 uIU/mL    Comment: Performed by a 3rd Generation assay with a functional sensitivity of <=0.01 uIU/mL. Performed at [facility], [place] [address] [place], [place] [address]     Blood Alcohol level:  Lab Results  Component Value Date   ETH <10 11/10/2017   ETH <10 35/70/1779    Metabolic Disorder Labs:  Lab Results  Component Value Date   HGBA1C 5.9 (H) 11/10/2017   MPG 122.63 11/10/2017   No results found for: PROLACTIN Lab Results  Component Value Date   CHOL 156 12/28/2018   TRIG 69 12/28/2018   HDL 43 12/28/2018   CHOLHDL 3.6 12/28/2018   VLDL 14 12/28/2018   LDLCALC 99 12/28/2018   LDLCALC 124 (H) 11/10/2017    Current Medications: Current Facility-Administered Medications  Medication Dose Route Frequency Provider Last Rate Last Dose  . alum & mag hydroxide-simeth (MAALOX/MYLANTA) 200-200-20 MG/5ML suspension 30 mL  30 mL Oral Q4H PRN Money, Lowry Ram, FNP      . hydrOXYzine (ATARAX/VISTARIL) tablet 25 mg  25 mg Oral TID PRN Money, Lowry Ram, FNP   25 mg at 12/27/18 1740  . magnesium hydroxide (MILK OF MAGNESIA) suspension 30 mL  30 mL Oral Daily PRN Money, Lowry Ram, FNP      . QUEtiapine (SEROQUEL) tablet 100 mg  100 mg Oral QHS Sharma Covert, MD      . sertraline (ZOLOFT) tablet 50 mg  50 mg Oral Daily Sharma Covert, MD   50 mg at 12/28/18 3903  . traZODone (DESYREL)  tablet 50 mg  50 mg Oral QHS PRN Money, Lowry Ram, FNP       PTA Medications: No medications prior to admission.    Musculoskeletal: Strength & Muscle Tone: within normal limits Gait & Station: normal Patient leans: N/A  Psychiatric Specialty Exam: Physical Exam  Nursing note and vitals reviewed. Constitutional: She is oriented to person, place, and time. She appears well-developed and well-nourished.  HENT:  Head: Normocephalic and atraumatic.  Respiratory: Effort normal.  Neurological: She is alert and oriented to person, place, and time.    ROS  Blood pressure (!) 118/99, pulse 88, temperature 98 F (36.7 C), temperature source Oral, resp. rate 16, height 6' (  1.829 m), weight 127 kg, last menstrual period 12/06/2018, SpO2 100 %.Body mass index is 37.97 kg/m.  General Appearance: Disheveled  Eye Contact:  Fair  Speech:  Normal Rate  Volume:  Increased  Mood:  Anxious, Depressed and Dysphoric  Affect:  Congruent  Thought Process:  Coherent and Descriptions of Associations: Intact  Orientation:  Full (Time, Place, and Person)  Thought Content:  Logical  Suicidal Thoughts:  Yes.  without intent/plan  Homicidal Thoughts:  No  Memory:  Immediate;   Fair Recent;   Fair Remote;   Fair  Judgement:  Impaired  Insight:  Lacking  Psychomotor Activity:  Increased  Concentration:  Concentration: Fair and Attention Span: Fair  Recall:  [employer] of Knowledge:  Fair  Language:  Fair  Akathisia:  Negative  Handed:  Right  AIMS (if indicated):     Assets:  Desire for Improvement Physical Health Resilience  ADL's:  Intact  Cognition:  WNL  Sleep:  Number of Hours: 5.5    Treatment Plan Summary: Daily contact with patient to assess and evaluate symptoms and progress in treatment, Medication management and Plan : Patient is seen and examined.  Patient is a 43 year old female with the above-stated past psychiatric history who was admitted for worsening depression and suicidal  ideation.  She will be admitted to the hospital.  Should be integrated into the milieu.  She will be encouraged to attend groups.  She will be seen by social work.  She is requesting assistance to get involved with ACTT service.  I have recommended that she discuss this with social work.  She has been unable to get her medications because of several different issues.  We will restart her Zoloft to 50 mg p.o. daily and titrate during the course the hospitalization.  She stated that she found that Seroquel was more effective than Abilify, and we will restart that at 100 mg p.o. nightly.  Her drug screen is negative, and she stated that she been sober for approximately 1 month.  Review of her laboratories were essentially negative except for a positive bacterial vaginitis.  Review of the electronic medical record revealed that she did receive Flagyl 500 mg p.o. twice daily for 7 days.  We will confirm with her that she received that treatment.  Observation Level/Precautions:  15 minute checks  Laboratory:  Chemistry Profile  Psychotherapy:    Medications:    Consultations:    Discharge Concerns:    Estimated LOS:  Other:     Physician Treatment Plan for Primary Diagnosis: MDD (major depressive disorder), recurrent severe, without psychosis ([place]) Long Term Goal(s): Improvement in symptoms so as ready for discharge  Short Term Goals: Ability to identify changes in lifestyle to reduce recurrence of condition will improve, Ability to verbalize feelings will improve, Ability to disclose and discuss suicidal ideas, Ability to demonstrate self-control will improve, Ability to identify and develop effective coping behaviors will improve, Ability to maintain clinical measurements within normal limits will improve and Ability to identify triggers associated with substance abuse/mental health issues will improve  Physician Treatment Plan for Secondary Diagnosis: Principal Problem:   MDD (major depressive  disorder), recurrent severe, without psychosis ([place]) Active Problems:   MDD (major depressive disorder), severe ([place])  Long Term Goal(s): Improvement in symptoms so as ready for discharge  Short Term Goals: Ability to identify changes in lifestyle to reduce recurrence of condition will improve, Ability to verbalize feelings will improve, Ability to disclose and discuss suicidal ideas,  Ability to demonstrate self-control will improve, Ability to identify and develop effective coping behaviors will improve, Ability to maintain clinical measurements within normal limits will improve and Ability to identify triggers associated with substance abuse/mental health issues will improve  I certify that inpatient services furnished can reasonably be expected to improve the patient's condition.    Sharma Covert, MD 3/28/20201:29 PM

## 2018-12-28 NOTE — Plan of Care (Signed)
Problem: Education: Goal: Knowledge of [facility] Education information/materials will improve Outcome: Progressing   Problem: Coping: Goal: Ability to verbalize frustrations and anger appropriately will improve Outcome: Progressing   Problem: Health Behavior/Discharge Planning: Goal: Identification of resources available to assist in meeting health care needs will improve Outcome: Progressing   Problem: Safety: Goal: Periods of time without injury will increase Outcome: Progressing Pt A & O X4. Denies HI, AVH and pain when assessed. Endorsed passive SI without plan at this time. Per pt "it's there, it's on and off, it's a feeling". Pt's mood has been labile on interactions with periods of animation and tearfulness. Reports depression 10/10, hopelessness 10/10 and anxiety 7/10 "because I feel like I got stuff crawling all over my skin, I did the lice and scabies treatment couple weeks ago, I just don't feel like I did it right". Reports she slept well last night with good appetite, low energy and good concentration level.  Emotional support and encouragement offered to patient as needed. All medications administered with verbal education and effects monitored. Safety checks maintained at Q 15 minutes intervals without self harm gestures to note thus far.  Pt did not attend groups as scheduled despite multiple prompts "I'm sleepy". Tolerates all PO intake well. Compliant with medications when offered. Denies adverse drug reactions. Safety maintained on and off unit without incident.
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
## 2018-12-28 NOTE — BHH Group Notes (Signed)
[place] Group Notes: (Clinical Social Work)   12/28/2018      Type of Therapy:  Group Therapy   Participation Level:  Did Not Attend - was invited both individually by MHT and by overhead announcement   Selmer Dominion, LCSW 12/28/2018, 12:12 PM

## 2018-12-28 NOTE — BHH Suicide Risk Assessment (Signed)
[facility] INPATIENT:  Family/Significant Other Suicide Prevention Education  Suicide Prevention Education:  Patient Refusal for Family/Significant Other Suicide Prevention Education: The patient Ashley Stevens has refused to provide written consent for family/significant other to be provided Family/Significant Other Suicide Prevention Education during admission and/or prior to discharge.  Physician notified. SPE completed with patient. The patient is provided with suicide prevention brochure.  Rolanda Jay 12/28/2018, 4:18 PM

## 2018-12-28 NOTE — Progress Notes (Signed)
Pt in room at shift change, in bed.  Pt sts she just wants to catch up on her sleep and denies isolating.  Pt confirms symptoms of depression, anxiety and passive SI and verbally contracts for safety.  Pt denies HI and AVH.  Pt denies pain or discomfort. Pt given support and encouragement.  Pt declined to ask any questions. Pt remains in room and remains safe on unit.
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
## 2018-12-29 DIAGNOSIS — R450 Nervousness: Secondary | ICD-10-CM

## 2018-12-29 DIAGNOSIS — F17210 Nicotine dependence, cigarettes, uncomplicated: Secondary | ICD-10-CM

## 2018-12-29 DIAGNOSIS — F603 Borderline personality disorder: Secondary | ICD-10-CM

## 2018-12-29 DIAGNOSIS — Z915 Personal history of self-harm: Secondary | ICD-10-CM

## 2018-12-29 MED ORDER — QUETIAPINE FUMARATE 200 MG PO TABS
200.0000 mg | ORAL_TABLET | Freq: Every day | ORAL | Status: DC
  Administered 2018-12-29: 200 mg via ORAL
  Filled 2018-12-29 (×3): qty 1

## 2018-12-29 MED ORDER — NICOTINE 21 MG/24HR TD PT24
21.0000 mg | MEDICATED_PATCH | Freq: Every day | TRANSDERMAL | Status: DC
  Administered 2018-12-29 – 2019-01-01 (×4): 21 mg via TRANSDERMAL
  Filled 2018-12-29 (×4): qty 1

## 2018-12-29 MED ORDER — DIPHENHYDRAMINE HCL 25 MG PO CAPS
50.0000 mg | ORAL_CAPSULE | ORAL | Status: DC | PRN
  Administered 2018-12-29 – 2018-12-31 (×4): 50 mg via ORAL
  Filled 2018-12-29 (×3): qty 2

## 2018-12-29 NOTE — Progress Notes (Signed)
D:  Patient denied HI.  Denied A/V hallucinations.  SI off/on, contracts for safety.   A:  Medications administered per MD orders.  Emotional support and encouragement given patient. R:  Safety maintained with 15 minute checks. Patient has been in bed most of the day.

## 2018-12-29 NOTE — Plan of Care (Signed)
Nurse discussed anxiety, depression and coping skills with patient.  

## 2018-12-29 NOTE — Progress Notes (Signed)
[place] [facility] Group Notes:  (Nursing/MHT/Case Management/Adjunct)  Date:  12/29/2018  Time:  2030  Type of Therapy:  wrap up group  Participation Level:  Active  Participation Quality:  Attentive, Monopolizing, Redirectable, Sharing and Supportive  Affect:  Appropriate  Cognitive:  Appropriate  Insight:  Improving  Engagement in Group:  Engaged  Modes of Intervention:  Clarification, Education and Support  Summary of Progress/Problems: Pt shared that not much good happened today but did feel it was positive she was here. Pt shared that a Education officer, museum sat and listened to her vent today and it helped getting her feelings out. If pt could change any one thing about her life it would be having more support since her parents passed away and some place to call home instead of hotels.  Pt reports many stressors that come along with being homeless. Pt shares that she is grateful for her three children aged 35, 10, and 78 but wants supports for her and them.   Winfield Rast S 12/29/2018, 10:12 PM

## 2018-12-29 NOTE — Progress Notes (Signed)
CSW spoke with patient at patient's request for approximately one hour. Patient was tearful with a labile mood. Patient spoke of frustrations with staff here not giving her additional medications for scabies and pinworms (already treated in the ED). She spoke of frustrations with being denied by Medicaid to receive ACTT services.  Patient states her primary motivation for being established with an ACT Team is that ACTT provides housing referral assistance and placement services. She feels that if she has stable housing, she can address her mental health issues. CSW briefly reviewed ACTT criteria with patient, patient explains she has already done the Medicaid guideline research and believes she will qualify upon appeal, she asks CSW to print out Medicaid service guidelines.  CSW asked patient about a referral for Transitional Care Team (TCT) services through [place]. Patient may be interested, depending on the extent of their housing assistance services. CSW will follow up with [facility] tomorrow (Monday) to inquire about a phone screening for the patient.  Stephanie Acre, LCSW-A Clinical Social Worker

## 2018-12-29 NOTE — Progress Notes (Signed)
D: Patient observed up and restless in the milieu. On the phone frequently. Watching movie with peer. Patient states, "I'm okay I guess. Just dealing with a lot." Patient's affect depressed and anxious with congruent mood. Denies pain, physical complaints.   A: Medicated per orders, prn trazadone and vistaril given per her request. Medication education provided. Level III obs in place for safety. Emotional support offered. Patient encouraged to complete Suicide Safety Plan before discharge. Encouraged to attend and participate in unit programming.   R: Patient verbalizes understanding of POC. On reassess, patient is asleep. Patient denies HI/AVH. Endorses passive SI but denies plan or intent. Verbal contract in place and remains safe on level III obs. Will continue to monitor throughout the night.

## 2018-12-29 NOTE — Progress Notes (Addendum)
[facility] MD Progress Note  12/29/2018 10:54 AM Ashley Stevens  MRN:  [id number]   Subjective:  Focused on her pinworm and scabies issues, medications received in ED but wants more to "kick it out."  She says she did the medication wrong as she "washed before I used it and sweated."  10/10 depression and anxiety, sleep and appetite are "good."  Objective: 43 yo female admitted with worsening depression, suicidal ideations with a plan to overdose on medications.  One past suicide attempt last year by overdosing.  Relapsed on cocaine recently after breaking up with her boyfriend.  Today the patient was seen and evaluated in her room.  She was irritable and argumentative on assessment related to demanding pin worm medication and scabies medications.  She was treated a couple of days in the ED and reports she used the cream wrong and should not have received the prednisone they gave her because she read that made things worse.  Demanded to see the MD or she was leaving, he is going to see her later.  She is upset that she is not receiving more of this medications despite being told that it could harm her liver.  Now, she is fixated on getting the scabies cream.  Encouraged her to give the medication time to work.  10/10 for depression and anxiety voiced but interacting and attending groups with no signs of either.  Sleep and appetite are "good". She reports being homeless which is her next biggest stressor.  Denies hallucinations and adverse effects from her medications.    Principal Problem: MDD (major depressive disorder), recurrent severe, without psychosis ([place]) Diagnosis: Principal Problem:   MDD (major depressive disorder), recurrent severe, without psychosis ([place]) Active Problems:   Borderline personality disorder ([place])  Total Time spent with patient: 30 minutes  Past Psychiatric History: depression, anxiety, substance abuse, borderline personality  Past Medical History:  Past Medical History:   Diagnosis Date  . Anxiety   . Depression   . Shingles   . Suicidal ideations     Past Surgical History:  Procedure Laterality Date  . c-section     Family History:  Family History  Problem Relation Age of Onset  . Heart failure Mother   . Kidney failure Brother   . Diabetes Other   . Hypertension Other    Family Psychiatric  History: none Social History:  Social History   Substance and Sexual Activity  Alcohol Use No     Social History   Substance and Sexual Activity  Drug Use Yes  . Types: Marijuana, Cocaine   Comment: Pt denies    Social History   Socioeconomic History  . Marital status: Legally Separated    Spouse name: Not on file  . Number of children: Not on file  . Years of education: Not on file  . Highest education level: Not on file  Occupational History  . Not on file  Social Needs  . Financial resource strain: Not on file  . Food insecurity:    Worry: Not on file    Inability: Not on file  . Transportation needs:    Medical: Not on file    Non-medical: Not on file  Tobacco Use  . Smoking status: Current Every Day Smoker    Packs/day: 1.00    Types: Cigarettes  . Smokeless tobacco: Never Used  Substance and Sexual Activity  . Alcohol use: No  . Drug use: Yes    Types: Marijuana, Cocaine  Comment: Pt denies  . Sexual activity: Yes    Birth control/protection: Condom  Lifestyle  . Physical activity:    Days per week: Not on file    Minutes per session: Not on file  . Stress: Not on file  Relationships  . Social connections:    Talks on phone: Not on file    Gets together: Not on file    Attends religious service: Not on file    Active member of club or organization: Not on file    Attends meetings of clubs or organizations: Not on file    Relationship status: Not on file  Other Topics Concern  . Not on file  Social History Narrative  . Not on file   Additional Social History:    Pain Medications: see MAR Prescriptions:  see MAR Over the Counter: see MAR History of alcohol / drug use?: Yes Name of Substance 1: crack cocaine 1 - Age of First Use: 39 1 - Amount (size/oz): varies 1 - Frequency: daily 1 - Duration: 3 years 1 - Last Use / Amount: 12/22/2018- unknown amount                  Sleep: Good  Appetite:  Good  Current Medications: Current Facility-Administered Medications  Medication Dose Route Frequency Provider Last Rate Last Dose  . alum & mag hydroxide-simeth (MAALOX/MYLANTA) 200-200-20 MG/5ML suspension 30 mL  30 mL Oral Q4H PRN Money, Lowry Ram, FNP      . hydrOXYzine (ATARAX/VISTARIL) tablet 25 mg  25 mg Oral TID PRN Money, Lowry Ram, FNP   25 mg at 12/28/18 2120  . magnesium hydroxide (MILK OF MAGNESIA) suspension 30 mL  30 mL Oral Daily PRN Money, Lowry Ram, FNP      . QUEtiapine (SEROQUEL) tablet 100 mg  100 mg Oral QHS Sharma Covert, MD   100 mg at 12/28/18 2120  . sertraline (ZOLOFT) tablet 50 mg  50 mg Oral Daily Sharma Covert, MD   50 mg at 12/29/18 0932  . traZODone (DESYREL) tablet 50 mg  50 mg Oral QHS PRN Money, Lowry Ram, FNP   50 mg at 12/28/18 2220    Lab Results:  Results for orders placed or performed during the hospital encounter of 12/27/18 (from the past 48 hour(s))  Pregnancy, urine     Status: None   Collection Time: 12/27/18  7:02 PM  Result Value Ref Range   Preg Test, Ur NEGATIVE NEGATIVE    Comment:        THE SENSITIVITY OF THIS METHODOLOGY IS >20 mIU/mL. Performed at [facility], [place] [address], [place], [place] [address]   Urine rapid drug screen (hosp performed)not at [facility]     Status: None   Collection Time: 12/27/18  7:02 PM  Result Value Ref Range   Opiates NONE DETECTED NONE DETECTED   Cocaine NONE DETECTED NONE DETECTED   Benzodiazepines NONE DETECTED NONE DETECTED   Amphetamines NONE DETECTED NONE DETECTED   Tetrahydrocannabinol NONE DETECTED NONE DETECTED   Barbiturates NONE DETECTED NONE DETECTED    Comment:  (NOTE) DRUG SCREEN FOR MEDICAL PURPOSES ONLY.  IF CONFIRMATION IS NEEDED FOR ANY PURPOSE, NOTIFY LAB WITHIN 5 DAYS. LOWEST DETECTABLE LIMITS FOR URINE DRUG SCREEN Drug Class                     Cutoff (ng/mL) Amphetamine and metabolites    1000 Barbiturate and metabolites    200 Benzodiazepine  165 Tricyclics and metabolites     300 Opiates and metabolites        300 Cocaine and metabolites        300 THC                            50 Performed at [facility], [place] [address] [place], [place] [address]   CBC     Status: Abnormal   Collection Time: 12/28/18  6:19 AM  Result Value Ref Range   WBC 10.7 (H) 4.0 - 10.5 K/uL   RBC 4.85 3.87 - 5.11 MIL/uL   Hemoglobin 12.6 12.0 - 15.0 g/dL   HCT 41.4 36.0 - 46.0 %   MCV 85.4 80.0 - 100.0 fL   MCH 26.0 26.0 - 34.0 pg   MCHC 30.4 30.0 - 36.0 g/dL   RDW 15.3 11.5 - 15.5 %   Platelets 375 150 - 400 K/uL   nRBC 0.0 0.0 - 0.2 %    Comment: Performed at [facility], [place] [address], [place], [place] [address]  Comprehensive metabolic panel     Status: Abnormal   Collection Time: 12/28/18  6:19 AM  Result Value Ref Range   Sodium 137 135 - 145 mmol/L   Potassium 4.3 3.5 - 5.1 mmol/L   Chloride 109 98 - 111 mmol/L   CO2 21 (L) 22 - 32 mmol/L   Glucose, Bld 97 70 - 99 mg/dL   BUN 12 6 - 20 mg/dL   Creatinine, Ser 1.00 0.44 - 1.00 mg/dL   Calcium 8.9 8.9 - 10.3 mg/dL   Total Protein 6.0 (L) 6.5 - 8.1 g/dL   Albumin 3.2 (L) 3.5 - 5.0 g/dL   AST 17 15 - 41 U/L   ALT 10 0 - 44 U/L   Alkaline Phosphatase 60 38 - 126 U/L   Total Bilirubin 0.4 0.3 - 1.2 mg/dL   GFR calc non Af Amer >60 >60 mL/min   GFR calc Af Amer >60 >60 mL/min   Anion gap 7 5 - 15    Comment: Performed at [facility], [place] [address] [place], [place] [address]  Lipid panel     Status: None   Collection Time: 12/28/18  6:19 AM  Result Value Ref Range   Cholesterol 156 0 - 200 mg/dL    Triglycerides 69 <150 mg/dL   HDL 43 >40 mg/dL   Total CHOL/HDL Ratio 3.6 RATIO   VLDL 14 0 - 40 mg/dL   LDL Cholesterol 99 0 - 99 mg/dL    Comment:        Total Cholesterol/HDL:CHD Risk Coronary Heart Disease Risk Table                     Men   Women  1/2 Average Risk   3.4   3.3  Average Risk       5.0   4.4  2 X Average Risk   9.6   7.1  3 X Average Risk  23.4   11.0        Use the calculated Patient Ratio above and the CHD Risk Table to determine the patient's CHD Risk.        ATP III CLASSIFICATION (LDL):  <100     mg/dL   Optimal  100-129  mg/dL   Near or Above  Optimal  130-159  mg/dL   Borderline  160-189  mg/dL   High  >190     mg/dL   Very High Performed at [place] [address] [place], [place] [address]   TSH     Status: None   Collection Time: 12/28/18  6:19 AM  Result Value Ref Range   TSH 1.273 0.350 - 4.500 uIU/mL    Comment: Performed by a 3rd Generation assay with a functional sensitivity of <=0.01 uIU/mL. Performed at [facility], [place] [address], [place], [place] [address]     Blood Alcohol level:  Lab Results  Component Value Date   ETH <10 11/10/2017   ETH <10 89/38/1017    Metabolic Disorder Labs: Lab Results  Component Value Date   HGBA1C 5.9 (H) 11/10/2017   MPG 122.63 11/10/2017   No results found for: PROLACTIN Lab Results  Component Value Date   CHOL 156 12/28/2018   TRIG 69 12/28/2018   HDL 43 12/28/2018   CHOLHDL 3.6 12/28/2018   VLDL 14 12/28/2018   LDLCALC 99 12/28/2018   LDLCALC 124 (H) 11/10/2017    Physical Findings: AIMS: Facial and Oral Movements Muscles of Facial Expression: None, normal Lips and Perioral Area: None, normal Jaw: None, normal Tongue: None, normal,Extremity Movements Upper (arms, wrists, hands, fingers): None, normal Lower (legs, knees, ankles, toes): None, normal, Trunk Movements Neck, shoulders, hips: None, normal, Overall  Severity Severity of abnormal movements (highest score from questions above): None, normal Incapacitation due to abnormal movements: None, normal Patient's awareness of abnormal movements (rate only patient's report): No Awareness, Dental Status Current problems with teeth and/or dentures?: No Does patient usually wear dentures?: No  CIWA:  CIWA-Ar Total: 1 COWS:  COWS Total Score: 2  Musculoskeletal: Strength & Muscle Tone: within normal limits Gait & Station: normal Patient leans: N/A  Psychiatric Specialty Exam: Physical Exam  Nursing note and vitals reviewed. Constitutional: She is oriented to person, place, and time. She appears well-developed and well-nourished.  HENT:  Head: Normocephalic.  Neck: Normal range of motion.  Respiratory: Effort normal.  Musculoskeletal: Normal range of motion.  Neurological: She is alert and oriented to person, place, and time.  Psychiatric: Her speech is normal and behavior is normal. Judgment and thought content normal. Her mood appears anxious. Cognition and memory are normal. She exhibits a depressed mood.    Review of Systems  Psychiatric/Behavioral: Positive for depression. The patient is nervous/anxious.   All other systems reviewed and are negative.   Blood pressure 126/83, pulse 98, temperature 98.2 F (36.8 C), temperature source Oral, resp. rate 20, height 6' (1.829 m), weight 127 kg, last menstrual period 12/06/2018, SpO2 100 %.Body mass index is 37.97 kg/m.  General Appearance: Casual  Eye Contact:  Good  Speech:  Normal Rate  Volume:  Normal  Mood:  Anxious, Depressed and Irritable  Affect:  Congruent  Thought Process:  Coherent and Descriptions of Associations: Intact  Orientation:  Full (Time, Place, and Person)  Thought Content:  Obsessions  Suicidal Thoughts:  No  Homicidal Thoughts:  No  Memory:  Immediate;   Fair Recent;   Fair Remote;   Fair  Judgement:  Fair  Insight:  Fair  Psychomotor Activity:  Normal   Concentration:  Concentration: Fair and Attention Span: Fair  Recall:  [employer] of Knowledge:  Fair  Language:  Fair  Akathisia:  No  Handed:  Right  AIMS (if indicated):     Assets:  Leisure Time Physical Health Resilience  ADL's:  Intact  Cognition:  WNL  Sleep:  Number of Hours: 6.75    Treatment Plan Summary: Daily contact with patient to assess and evaluate symptoms and progress in treatment, Medication management and PlanMajor depressive disorder, recurrent, severe without psychosis: -Individual and group therapy -Continued Zoloft 50 mg daily  Anxiety: -Continued hydroxyzine 25 mg TID PRN  Insomnia: -Continued Seroquel 100 mg at bedtime -Continued Trazodone 50 mg at bedtime PRN  1. Will maintain Q 15 minutes observation for safety. Estimated LOS: 5-7 days 2. Patient will participate in group, milieu, and individualtherapy.Psychotherapy: Social and Airline pilot, learning based strategies, cognitive behavioral, and family object relations individuation separation intervention psychotherapies can be considered.  3. Will continue to monitor patient's mood and behavior. 4. Social Workinvolvement to discussdischarge and concerns will also be addressed: Safety, stabilization, and access to medication 5. Discharge date TBD  Waylan Boga, NP 12/29/2018, 10:54 AM   NP requested I address patients concern for "burrows under the skin". The areas on her legs were were examined and do not appear to be consistent with infestation. She is irritated that I will not treat this issue. I also stated she could follow up with her her PCP after discharge and that if she did not have one we would refer her to one after discharge. She remains irritable and demanding. Will increase her seroquel in case irritablity and rumination is secondary to underlying psychiatric illness. Myles Lipps MD

## 2018-12-29 NOTE — Progress Notes (Signed)
Patient's self inventory sheet, patient has fair sleep, sleep medication helpful.  Good appetite, low energy level, poor concentration.  Rated depression and hopoeless 10, anxiety 9.  Denied withdrawals.  SI almost all the time, off/on, contracts for safety, no plan.  Physical problems, pain, worst pain #7 in past 24 hours, back.  Does take pain medicine.  Goal is getting medication needed.  Plans to talk to MD.  No discharge plans.  Concerns:  Housing, t transportation, depression, death.

## 2018-12-29 NOTE — [facility] Group Notes (Signed)
LCSW Group Therapy Note  12/29/2018 10:44 AM  Type of Therapy/Topic: Group Therapy: Feelings about Diagnosis  Participation Level: Did Not Attend   Description of Group:  This group will allow patients to explore their thoughts and feelings about diagnoses they have received. Patients will be guided to explore their level of understanding and acceptance of these diagnoses. Facilitator will encourage patients to process their thoughts and feelings about the reactions of others to their diagnosis and will guide patients in identifying ways to discuss their diagnosis with significant others in their lives. This group will be process-oriented, with patients participating in exploration of their own experiences, giving and receiving support, and processing challenge from other group members.  Therapeutic Goals: 1. Patient will demonstrate understanding of diagnosis as evidenced by identifying two or more symptoms of the disorder 2. Patient will be able to express two feelings regarding the diagnosis 3. Patient will demonstrate their ability to communicate their needs through discussion and/or role play  Summary of Patient Progress:  Invited, chose not to attend.  Therapeutic Modalities:  Cognitive Behavioral Therapy Brief Therapy Feelings Identification   Stephanie Acre, MSW, [place] Social Worker

## 2018-12-29 NOTE — Progress Notes (Signed)
Patient stated she would like appointment set up with dermatologist after discharge because she had scabies in Feb 2020.  Patient stated she feels that something is still crawling on her skin, dry skin, wants to see specialist to be checked.  Patient stated she continues to have SI thoughts, stated SI thoughts are feelings that do not go away.  No plans, contracts for safety.  Denied HI.  Denied A/V hallucinations.  Patient stated she has had some paranoid feelings, that someone may be in her room when there is no one in the room.  Sometimes she thinks she sees someone at her side (peripheral vision).    Patient feels stressed today.

## 2018-12-30 DIAGNOSIS — Z818 Family history of other mental and behavioral disorders: Secondary | ICD-10-CM

## 2018-12-30 MED ORDER — QUETIAPINE FUMARATE 400 MG PO TABS
400.0000 mg | ORAL_TABLET | Freq: Every day | ORAL | Status: DC
  Filled 2018-12-30 (×3): qty 1

## 2018-12-30 MED ORDER — FLUOXETINE HCL 20 MG PO CAPS
20.0000 mg | ORAL_CAPSULE | Freq: Every day | ORAL | Status: DC
  Administered 2018-12-30 – 2019-01-01 (×3): 20 mg via ORAL
  Filled 2018-12-30 (×4): qty 1

## 2018-12-30 MED ORDER — QUETIAPINE FUMARATE 200 MG PO TABS
200.0000 mg | ORAL_TABLET | Freq: Once | ORAL | Status: AC
  Administered 2018-12-30: 200 mg via ORAL
  Filled 2018-12-30 (×2): qty 1

## 2018-12-30 NOTE — Progress Notes (Signed)
Recreation Therapy Notes  Date:  3.20.20 Time: 0930 Location: 300 [address] Dayroom  Group Topic: Stress Management  Goal Area(s) Addresses:  Patient will identify positive stress management techniques. Patient will identify benefits of using stress management post d/c.  Intervention:  Stress Management  Activity :  Meditation.  LRT introduced the stress management technique of meditation.  LRT played a meditation that focused on making the most of the day and the possibilities that are available.  Patients were to listen as the meditation as it played to engage in the meditation.   Education:  Stress Management, Discharge Planning.   Education Outcome: Acknowledges Education  Clinical Observations/Feedback:  Pt did not attend group.     Victorino Sparrow, LRT/CTRS      Victorino Sparrow A 12/30/2018 12:34 PM

## 2018-12-30 NOTE — Plan of Care (Signed)
Patient was resting in bed with eyes open upon approach. Patient was told to get medications at the med window. Patient rolled her eyes and said "Why can't you just bring them here...? I'm exhausted." Patient was later brought medications and apologized for the previous behavior. Patient said she already feels as if her medications were making her feel better since she felt like she was getting too much before coming in. Patient is passively suicidal with no plan. Denies HI AVH. Deneis physical pain. Safety is maintained with 25 minute checks as well as environmental checks. Will continue to monitor and provide support throughout the shift.  Problem: Education: Goal: Emotional status will improve Outcome: Progressing Goal: Mental status will improve Outcome: Progressing Goal: Verbalization of understanding the information provided will improve Outcome: Progressing   Problem: Activity: Goal: Interest or engagement in activities will improve Outcome: Progressing

## 2018-12-30 NOTE — Progress Notes (Addendum)
Patient completed a phone screening with Langley Gauss of [facility] Team (TCT). Patient wants to pursue getting established with an ACT Team, reports if she can't get ACTT, she doesn't want anything else. Her primary motivation for seeking ACTT services is housing assistance.  [facility] declined the patient for ACTT services, patient is aware and is working on her own appeal. She reports that she has an ACTT services screening on Wednesday, 04/01 at 11am at [facility]. She hopes to discharge from [facility] on Wednesday morning and go straight to her appointment.   Patient expected to discharge soon, she will likely discharge with shelter resources.  Stephanie Acre, LCSW-A Clinical Social Worker

## 2018-12-30 NOTE — Tx Team (Signed)
Interdisciplinary Treatment and Diagnostic Plan Update  12/30/2018 Time of Session: 3:00pm Ashley DONALSON MRN: [id number]  Principal Diagnosis: MDD (major depressive disorder), recurrent severe, without psychosis (Ashley Stevens)  Secondary Diagnoses: Principal Problem:   MDD (major depressive disorder), recurrent severe, without psychosis (Ashley Stevens) Active Problems:   Borderline personality disorder (Ashley Stevens)   Current Medications:  Current Facility-Administered Medications  Medication Dose Route Frequency Provider Last Rate Last Dose  . alum & mag hydroxide-simeth (MAALOX/MYLANTA) 200-200-20 MG/5ML suspension 30 mL  30 mL Oral Q4H PRN Money, Darnelle Maffucci B, FNP   30 mL at 12/30/18 0930  . diphenhydrAMINE (BENADRYL) capsule 50 mg  50 mg Oral Q4H PRN Sharma Covert, MD   50 mg at 12/29/18 2115  . FLUoxetine (PROZAC) capsule 20 mg  20 mg Oral Daily Johnn Hai, MD   20 mg at 12/30/18 1245  . hydrOXYzine (ATARAX/VISTARIL) tablet 25 mg  25 mg Oral TID PRN Money, Lowry Ram, FNP   25 mg at 12/29/18 1815  . magnesium hydroxide (MILK OF MAGNESIA) suspension 30 mL  30 mL Oral Daily PRN Money, Darnelle Maffucci B, FNP   30 mL at 12/30/18 0931  . nicotine (NICODERM CQ - dosed in mg/24 hours) patch 21 mg  21 mg Transdermal Daily Sharma Covert, MD   21 mg at 12/30/18 9147  . QUEtiapine (SEROQUEL) tablet 400 mg  400 mg Oral QHS Johnn Hai, MD      . traZODone (DESYREL) tablet 50 mg  50 mg Oral QHS PRN Money, Lowry Ram, FNP   50 mg at 12/29/18 2145   PTA Medications: No medications prior to admission.    Patient Stressors: Financial difficulties Loss of parents and only sibling Marital or family conflict Substance abuse  Patient Strengths: Ability for Engineer, manufacturing fund of knowledge Motivation for treatment/growth  Treatment Modalities: Medication Management, Group therapy, Case management,  1 to 1 session with clinician, Psychoeducation, Recreational therapy.   Physician Treatment Plan  for Primary Diagnosis: MDD (major depressive disorder), recurrent severe, without psychosis ([name]) Long Term Goal(s): Improvement in symptoms so as ready for discharge Improvement in symptoms so as ready for discharge   Short Term Goals: Ability to identify changes in lifestyle to reduce recurrence of condition will improve Ability to verbalize feelings will improve Ability to disclose and discuss suicidal ideas Ability to demonstrate self-control will improve Ability to identify and develop effective coping behaviors will improve Ability to maintain clinical measurements within normal limits will improve Ability to identify triggers associated with substance abuse/mental health issues will improve Ability to identify changes in lifestyle to reduce recurrence of condition will improve Ability to verbalize feelings will improve Ability to disclose and discuss suicidal ideas Ability to demonstrate self-control will improve Ability to identify and develop effective coping behaviors will improve Ability to maintain clinical measurements within normal limits will improve Ability to identify triggers associated with substance abuse/mental health issues will improve  Medication Management: Evaluate patient's response, side effects, and tolerance of medication regimen.  Therapeutic Interventions: 1 to 1 sessions, Unit Group sessions and Medication administration.  Evaluation of Outcomes: Progressing  Physician Treatment Plan for Secondary Diagnosis: Principal Problem:   MDD (major depressive disorder), recurrent severe, without psychosis (Ashley Stevens) Active Problems:   Borderline personality disorder ([name])  Long Term Goal(s): Improvement in symptoms so as ready for discharge Improvement in symptoms so as ready for discharge   Short Term Goals: Ability to identify changes in lifestyle to reduce recurrence of condition will improve Ability to  verbalize feelings will improve Ability to disclose and  discuss suicidal ideas Ability to demonstrate self-control will improve Ability to identify and develop effective coping behaviors will improve Ability to maintain clinical measurements within normal limits will improve Ability to identify triggers associated with substance abuse/mental health issues will improve Ability to identify changes in lifestyle to reduce recurrence of condition will improve Ability to verbalize feelings will improve Ability to disclose and discuss suicidal ideas Ability to demonstrate self-control will improve Ability to identify and develop effective coping behaviors will improve Ability to maintain clinical measurements within normal limits will improve Ability to identify triggers associated with substance abuse/mental health issues will improve     Medication Management: Evaluate patient's response, side effects, and tolerance of medication regimen.  Therapeutic Interventions: 1 to 1 sessions, Unit Group sessions and Medication administration.  Evaluation of Outcomes: Progressing   RN Treatment Plan for Primary Diagnosis: MDD (major depressive disorder), recurrent severe, without psychosis (Ashley Stevens) Long Term Goal(s): Knowledge of disease and therapeutic regimen to maintain health will improve  Short Term Goals: Ability to participate in decision making will improve, Ability to identify and develop effective coping behaviors will improve and Compliance with prescribed medications will improve  Medication Management: RN will administer medications as ordered by provider, will assess and evaluate patient's response and provide education to patient for prescribed medication. RN will report any adverse and/or side effects to prescribing provider.  Therapeutic Interventions: 1 on 1 counseling sessions, Psychoeducation, Medication administration, Evaluate responses to treatment, Monitor vital signs and CBGs as ordered, Perform/monitor CIWA, COWS, AIMS and Fall Risk  screenings as ordered, Perform wound care treatments as ordered.  Evaluation of Outcomes: Progressing   LCSW Treatment Plan for Primary Diagnosis: MDD (major depressive disorder), recurrent severe, without psychosis ([place]) Long Term Goal(s): Safe transition to appropriate next level of care at discharge, Engage patient in therapeutic group addressing interpersonal concerns.  Short Term Goals: Engage patient in aftercare planning with referrals and resources, Increase social support and Increase skills for wellness and recovery  Therapeutic Interventions: Assess for all discharge needs, 1 to 1 time with Social worker, Explore available resources and support systems, Assess for adequacy in community support network, Educate family and significant other(s) on suicide prevention, Complete Psychosocial Assessment, Interpersonal group therapy.  Evaluation of Outcomes: Progressing   Progress in Treatment: Attending groups: Yes. Participating in groups: Yes. Taking medication as prescribed: Yes. Toleration medication: Yes. Family/Significant other contact made: Yes, individual(s) contacted:  patient denied consents, completed SPE with patient Patient understands diagnosis: Yes. Discussing patient identified problems/goals with staff: Yes. Medical problems stabilized or resolved: Yes. Denies suicidal/homicidal ideation: No. Issues/concerns per patient self-inventory: No.   New problem(s) identified: Yes, Describe:  homeless, limited social supports, financial stressors  New Short Term/Long Term Goal(s):medication management for mood stabilization; elimination of SI thoughts; development of comprehensive mental wellness/sobriety plan.  Patient Goals:  Wants to get established with an ACT Team, so they can work with her to secure housing. Patient has been denied by [facility] and has to appeal the process on her own if she wishes to pursue this.   Discharge Plan or Barriers: Declines TCT for  outpatient follow up. Has a [facility] outpatient appointment established. Will likely discharge with shelter resources.   Reason for Continuation of Hospitalization: Anxiety Depression Suicidal ideation  Estimated Length of Stay: 1-3 days  Attendees: Patient: Ashley Stevens 12/30/2018 3:03 PM  Physician:  12/30/2018 3:03 PM  Nursing:  12/30/2018 3:03 PM  RN Care Manager:  12/30/2018 3:03 PM  Social Worker: Stephanie Acre, [place] 12/30/2018 3:03 PM  Recreational Therapist:  12/30/2018 3:03 PM  Other:  12/30/2018 3:03 PM  Other:  12/30/2018 3:03 PM  Other: 12/30/2018 3:03 PM    Scribe for Treatment Team: Joellen [place], [place] 12/30/2018 3:03 PM

## 2018-12-30 NOTE — Progress Notes (Signed)
[facility] MD Progress Note  12/30/2018 10:09 AM Ashley Stevens  MRN:  [id number] Subjective:    Patient focused on rehab and housing she is denying current suicidal thoughts but can contract here only Less irritable today denies wanting to harm others  Discussed past legal issues  Principal Problem: MDD (major depressive disorder), recurrent severe, without psychosis ([place]) Diagnosis: Principal Problem:   MDD (major depressive disorder), recurrent severe, without psychosis ([place]) Active Problems:   Borderline personality disorder ([place])  Total Time spent with patient: 20 minutes Past Medical History:  Past Medical History:  Diagnosis Date  . Anxiety   . Depression   . Shingles   . Suicidal ideations     Past Surgical History:  Procedure Laterality Date  . c-section     Family History:  Family History  Problem Relation Age of Onset  . Heart failure Mother   . Kidney failure Brother   . Diabetes Other   . Hypertension Other     Social History:  Social History   Substance and Sexual Activity  Alcohol Use No     Social History   Substance and Sexual Activity  Drug Use Yes  . Types: Marijuana, Cocaine   Comment: Pt denies    Social History   Socioeconomic History  . Marital status: Legally Separated    Spouse name: Not on file  . Number of children: Not on file  . Years of education: Not on file  . Highest education level: Not on file  Occupational History  . Not on file  Social Needs  . Financial resource strain: Not on file  . Food insecurity:    Worry: Not on file    Inability: Not on file  . Transportation needs:    Medical: Not on file    Non-medical: Not on file  Tobacco Use  . Smoking status: Current Every Day Smoker    Packs/day: 1.00    Types: Cigarettes  . Smokeless tobacco: Never Used  Substance and Sexual Activity  . Alcohol use: No  . Drug use: Yes    Types: Marijuana, Cocaine    Comment: Pt denies  . Sexual activity: Yes    Birth  control/protection: Condom  Lifestyle  . Physical activity:    Days per week: Not on file    Minutes per session: Not on file  . Stress: Not on file  Relationships  . Social connections:    Talks on phone: Not on file    Gets together: Not on file    Attends religious service: Not on file    Active member of club or organization: Not on file    Attends meetings of clubs or organizations: Not on file    Relationship status: Not on file  Other Topics Concern  . Not on file  Social History Narrative  . Not on file   Additional Social History:    Pain Medications: see MAR Prescriptions: see MAR Over the Counter: see MAR History of alcohol / drug use?: Yes Name of Substance 1: crack cocaine 1 - Age of First Use: 39 1 - Amount (size/oz): varies 1 - Frequency: daily 1 - Duration: 3 years 1 - Last Use / Amount: 12/22/2018- unknown amount                  Sleep: Fair  Appetite:  Fair  Current Medications: Current Facility-Administered Medications  Medication Dose Route Frequency Provider Last Rate Last Dose  . alum & mag hydroxide-simeth (  MAALOX/MYLANTA) 200-200-20 MG/5ML suspension 30 mL  30 mL Oral Q4H PRN Money, Darnelle Maffucci B, FNP   30 mL at 12/30/18 0930  . diphenhydrAMINE (BENADRYL) capsule 50 mg  50 mg Oral Q4H PRN Sharma Covert, MD   50 mg at 12/29/18 2115  . FLUoxetine (PROZAC) capsule 20 mg  20 mg Oral Daily Johnn Hai, MD      . hydrOXYzine (ATARAX/VISTARIL) tablet 25 mg  25 mg Oral TID PRN Money, Lowry Ram, FNP   25 mg at 12/29/18 1815  . magnesium hydroxide (MILK OF MAGNESIA) suspension 30 mL  30 mL Oral Daily PRN Money, Darnelle Maffucci B, FNP   30 mL at 12/30/18 0931  . nicotine (NICODERM CQ - dosed in mg/24 hours) patch 21 mg  21 mg Transdermal Daily Sharma Covert, MD   21 mg at 12/30/18 5573  . QUEtiapine (SEROQUEL) tablet 200 mg  200 mg Oral QHS Sharma Covert, MD   200 mg at 12/29/18 2115  . traZODone (DESYREL) tablet 50 mg  50 mg Oral QHS PRN Money,  Lowry Ram, FNP   50 mg at 12/29/18 2145    Lab Results: No results found for this or any previous visit (from the past 48 hour(s)).  Blood Alcohol level:  Lab Results  Component Value Date   ETH <10 11/10/2017   ETH <10 22/11/5425    Metabolic Disorder Labs: Lab Results  Component Value Date   HGBA1C 5.9 (H) 11/10/2017   MPG 122.63 11/10/2017   No results found for: PROLACTIN Lab Results  Component Value Date   CHOL 156 12/28/2018   TRIG 69 12/28/2018   HDL 43 12/28/2018   CHOLHDL 3.6 12/28/2018   VLDL 14 12/28/2018   LDLCALC 99 12/28/2018   LDLCALC 124 (H) 11/10/2017    Physical Findings: AIMS: Facial and Oral Movements Muscles of Facial Expression: None, normal Lips and Perioral Area: None, normal Jaw: None, normal Tongue: None, normal,Extremity Movements Upper (arms, wrists, hands, fingers): None, normal Lower (legs, knees, ankles, toes): None, normal, Trunk Movements Neck, shoulders, hips: None, normal, Overall Severity Severity of abnormal movements (highest score from questions above): None, normal Incapacitation due to abnormal movements: None, normal Patient's awareness of abnormal movements (rate only patient's report): No Awareness, Dental Status Current problems with teeth and/or dentures?: No Does patient usually wear dentures?: No  CIWA:  CIWA-Ar Total: 1 COWS:  COWS Total Score: 2  Musculoskeletal: Strength & Muscle Tone: within normal limits Gait & Station: normal Patient leans: N/A  Psychiatric Specialty Exam: Physical Exam  ROS  Blood pressure 122/73, pulse (!) 102, temperature 98.8 F (37.1 C), temperature source Oral, resp. rate 20, height 6' (1.829 m), weight 127 kg, last menstrual period 12/06/2018, SpO2 100 %.Body mass index is 37.97 kg/m.  General Appearance: Casual  Eye Contact:  Good  Speech:  Clear and Coherent  Volume:  Normal  Mood:  Dysphoric  Affect:  Appropriate  Thought Process:  Coherent  Orientation:  Full (Time, Place,  and Person)  Thought Content:  Tangential  Suicidal Thoughts:  No  Homicidal Thoughts:  No  Memory:  Recent;   Fair  Judgement:  Fair  Insight:  Fair  Psychomotor Activity:  Normal  Concentration:  Attention Span: Fair  Recall:  [employer] of Knowledge:  Fair  Language:  Fair  Akathisia:  Negative  Handed:  Right  AIMS (if indicated):     Assets:  Leisure Time Physical Health  ADL's:  Intact  Cognition:  WNL  Sleep:  Number of Hours: 3     Treatment Plan Summary: Daily contact with patient to assess and evaluate symptoms and progress in treatment, Medication management and Plan Patient did not sleep we will address this we will change her antidepressant to fluoxetine as that is better for irritability continue cognitive therapy probably discharge in 1 to 2 days seek some type of housing  [facility], MD 12/30/2018, 10:09 AM

## 2018-12-30 NOTE — Progress Notes (Signed)
D: Pt denies SI/HI/AVH. Pt is pleasant and cooperative most of the evening. Pt  got upset due to her Seroquel being increased to 400 mg from 200 mg. Pt stated she was not going to take 400 mg Seroquel due to taking 300 mg in the past and having a Seizure and blacking out and hit her head. Pt was willing to take 200 mg and Jason-NP ordered 1 x 200 mg. Pt encouraged to discuss her concerns with the doctor .  A: Pt was offered support and encouragement. Pt was given scheduled medications. Pt was encourage to attend groups. Q 15 minute checks were done for safety.  R:Pt attends groups and interacts well with peers and staff. Pt is taking medication. Pt has no complaints.Pt receptive to treatment and safety maintained on unit.  Problem: Education: Goal: Emotional status will improve Outcome: Progressing   Problem: Education: Goal: Mental status will improve Outcome: Progressing

## 2018-12-30 NOTE — Progress Notes (Signed)
D: Patient observed up and visible on the unit. Patient has multiple somatic complaints. Is observed to be restless at times. Patient's affect anxious with congruent mood.   Denies pain however does complain of constipation, heartburn, insomnia, itching and anxiety.   A: Medicated per orders, prn bendaryl, trazadone, MOM, and maalox given for complaints. Medication education provided. Level III obs in place for safety. Emotional support offered. Patient encouraged to complete Suicide Safety Plan before discharge. Encouraged to attend and participate in unit programming.   R: Patient verbalizes understanding of POC. On reassess, patient with improved symptoms and was able to fall asleep. Patient denies HI/AVH. Endorses passive SI but denies plan, intent. Verbal contract in place for safety. Patient remains safe on level III obs. Will continue to monitor throughout the night.

## 2018-12-31 MED ORDER — QUETIAPINE FUMARATE 400 MG PO TABS: 400.0000 mg | ORAL_TABLET | Freq: Every day | ORAL | 0 refills | Status: DC

## 2018-12-31 MED ORDER — HYDROXYZINE HCL 25 MG PO TABS: 25.0000 mg | ORAL_TABLET | Freq: Three times a day (TID) | ORAL | 0 refills | Status: DC | PRN

## 2018-12-31 MED ORDER — TRAZODONE HCL 50 MG PO TABS: 50.0000 mg | ORAL_TABLET | Freq: Every evening | ORAL | 0 refills | Status: DC | PRN

## 2018-12-31 MED ORDER — FLUOXETINE HCL 20 MG PO CAPS: 20.0000 mg | ORAL_CAPSULE | Freq: Every day | ORAL | 0 refills | Status: DC

## 2018-12-31 NOTE — [facility] Suicide Risk Assessment (Signed)
[facility] Discharge Suicide Risk Assessment   Principal Problem: MDD (major depressive disorder), recurrent severe, without psychosis ([place]) Discharge Diagnoses: Principal Problem:   MDD (major depressive disorder), recurrent severe, without psychosis ([place]) Active Problems:   Borderline personality disorder ([place])   Total Time spent with patient: 45 minutes  Currently is alert oriented fully cooperative without thoughts of harming self or others, no psychosis.  Mental Status Per Nursing Assessment::   On Admission:  Suicidal ideation indicated by patient  Demographic Factors:  Caucasian  Loss Factors: Decrease in vocational status  Historical Factors: Impulsivity  Risk Reduction Factors:   Sense of responsibility to family and Religious beliefs about death  Continued Clinical Symptoms:  Depression:   Severe  Cognitive Features That Contribute To Risk:  Polarized thinking    Suicide Risk:  Minimal: No identifiable suicidal ideation.  Patients presenting with no risk factors but with morbid ruminations; may be classified as minimal risk based on the severity of the depressive symptoms  Follow-up Information    [facility] Follow up.   Why:  You will have an appointment by phone on 04/02 at 10:30am . Please answer the call. Contact information: [place] [address] [phone]        [facility], [facility] Follow up.   Why:  Please attend your ACTT services screening appointment on 01/01/2019 at [place] your hospital discharge paperwork and photo ID with you. Contact information: [address] [place] [address] [phone]           Plan Of Care/Follow-up recommendations:  Activity:  full  Graylyn Bunney, MD 12/31/2018, 9:19 AM

## 2018-12-31 NOTE — Progress Notes (Signed)
D. Pt presents with an anxious affect/mood- friendly during interactions- per pt's self inventory, pt rates her depression, hopelessness and anxiety a 04/07/09, respectively. Pt writes that her most important goal to work on today is "getting my medical records, talking to doctor about my medication". Pt currently denies SI/HI and AV hallucinations. A. Labs and vitals monitored. Pt compliant with  medications. Pt supported emotionally and encouraged to express concerns and ask questions.   R. Pt remains safe with 15 minute checks. Will continue POC.

## 2018-12-31 NOTE — Progress Notes (Signed)
Patient ID: Ashley Stevens, female   DOB: 04/23/1976, 43 y.o.   MRN: [id number]  D: Patient pleasant on approach tonight. No active SI at present. Reports she is suppose to be discharged tomorrow and has an appointment with [facility]. She reports that her bedtime seroquel dose has been too high and last night she took 200mg . She reports that she slept all day until 5pm. She states she had reported how the seroquel was making her feel but it was not changed. She says that the high 400mg  dose has made her blackout before. The patient doesn't want to take this dose and nursing doesn't feel comfortable with asking on call provider to change her dosage again. She also already has discharge orders. She will get trazodone and vistaril tonight. A: Staff will continue to monitor on q 15 minute checks, follow treatment plans, and give medications as ordered. R: Cooperative on the unit.

## 2018-12-31 NOTE — Discharge Summary (Addendum)
Physician Discharge Summary Note  Patient:  Ashley Stevens is an 43 y.o., female MRN:  [id number] DOB:  September 01, 1976 Patient phone:  [phone] (home)  Patient address:   [address] [place] [address],  Total Time spent with patient: 30 minutes  Date of Admission:  12/27/2018 Date of Discharge: 01/01/2019  Reason for Admission: Patient is a 43 year old female with a reported past psychiatric history significant for major depression as well as cocaine dependence who presented to the behavioral health hospital on 12/27/2018 with worsening depression and suicidal ideation. The patient stated that she has been sober of cocaine x1 month. She stated that despite this she is having significant psychosocial stressors which is worsening her depressive symptoms. She stated she is homeless, and that is unable to continue her job because of the coronavirus. 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 Her homelessness as well as the lack of a computer has limited her there. She has most recently been seen by an organization in the community to assist with her depressive symptoms and psychiatric care. She was seen there and it was determined that she would be a decent candidate for a ACTT service. Unfortunately the local mental health authority did not authorize that. She stated that because of the fact that she is alone, she has all her family members who have passed, and that she has essentially no support that she needs additional assistance. She admitted to helplessness, hopelessness and worthlessness as well as suicidal ideation. Her last psychiatric hospitalization was approximately 1 year ago. She stated that she was admitted to some [facility] service as an inpatient for several days in October last year. She has been previously treated with Seroquel and Zoloft which  apparently has helped. She was admitted to the hospital for evaluation and stabilization.  Principal Problem: MDD (major depressive disorder), recurrent severe, without psychosis ([place]) Discharge Diagnoses: Principal Problem:   MDD (major depressive disorder), recurrent severe, without psychosis ([place]) Active Problems:   Borderline personality disorder [facility])   Past Psychiatric History: The patient has previous psychiatric admissions at behavioral health hospital as well as [facility] for depression and substance abuse.  She also has one previous residential program at [facility].  Past Medical History:  Past Medical History:  Diagnosis Date  . Anxiety   . Depression   . Shingles   . Suicidal ideations     Past Surgical History:  Procedure Laterality Date  . c-section     Family History:  Family History  Problem Relation Age of Onset  . Heart failure Mother   . Kidney failure Brother   . Diabetes Other   . Hypertension Other    Family Psychiatric  History:  Mother had depression and died at 7.  She also has a brother who is passed away. Social History:  Social History   Substance and Sexual Activity  Alcohol Use No     Social History   Substance and Sexual Activity  Drug Use Yes  . Types: Marijuana, Cocaine   Comment: Pt denies    Social History   Socioeconomic History  . Marital status: Legally Separated    Spouse name: Not on file  . Number of children: Not on file  . Years of education: Not on file  . Highest education level: Not on file  Occupational History  . Not on file  Social Needs  . Financial resource strain: Not on file  . Food insecurity:    Worry: Not on file    Inability: Not on file  . Transportation needs:    Medical: Not on file    Non-medical: Not on file  Tobacco Use  . Smoking status: Current Every Day Smoker    Packs/day: 1.00    Types: Cigarettes  . Smokeless tobacco: Never Used  Substance and Sexual Activity  . Alcohol  use: No  . Drug use: Yes    Types: Marijuana, Cocaine    Comment: Pt denies  . Sexual activity: Yes    Birth control/protection: Condom  Lifestyle  . Physical activity:    Days per week: Not on file    Minutes per session: Not on file  . Stress: Not on file  Relationships  . Social connections:    Talks on phone: Not on file    Gets together: Not on file    Attends religious service: Not on file    Active member of club or organization: Not on file    Attends meetings of clubs or organizations: Not on file    Relationship status: Not on file  Other Topics Concern  . Not on file  Social History Narrative  . Not on file    Hospital Course:  In brief; this is a 43 year old patient admitted tot he unit following worsening depression and suicidal ideation. She has a history of cocaine dependence. During initial evaluation she  stated she is homeless, and that is unable to continue her job because of the coronavirus.   After the above admission assessment and during this hospital course, patients presenting symptoms were identified. Labs were reviewed and are noted below. UDS and pregnancy was negative. Patient was treated and discharged with the following medications;  Prozac 20 mg po daily for depression Seroquel 100 mg at bedtime for insomnia  Trazodone 50 mg at bedtime PRN for insomnia  Vistaril 25 mg po TID as needed for anxiety   Patient tolerated her treatment regimen without any adverse effects reported. She remained compliant with therapeutic milieu and actively participated in group counseling sessions. AA/NA meetings were offered & held on the unit with patient active participation. While on the unit, patient was able to verbalize learned coping skills for better management of depression and suicidal thoughts and to better maintain these thoughts and symptoms when returning home.  During the course of her hospitalization, improvement of patients condition was monitored by  observation and patients daily report of symptom reduction, presentation of good affect, and overall improvement in mood & behavior.Upon discharge, Tearsa denied any SI/HI, AVH, delusional thoughts, or paranoia. She endorsed overall improvement in anxiety. She denied  any substance withdrawal symptoms.  Prior to discharge, Mikeisha's case was presented during treatment team meeting this morning. The team members were all in agreement that Sherronda was both mentally & medically stable to be discharged to continue mental health care on an outpatient basis as noted below. She was provided with all the necessary information needed to make this appointment without problems.She was provided with prescriptions  of her [facility] discharge medications to be taken to her phamacy. She left [facility] with all personal belongings in no apparent distress. Transportation per patients arrangement.  Physical Findings: AIMS: Facial and Oral Movements Muscles of Facial Expression: None, normal Lips and Perioral Area: None, normal Jaw: None, normal Tongue: None, normal,Extremity Movements Upper (arms, wrists, hands, fingers): None, normal Lower (  legs, knees, ankles, toes): None, normal, Trunk Movements Neck, shoulders, hips: None, normal, Overall Severity Severity of abnormal movements (highest score from questions above): None, normal Incapacitation due to abnormal movements: None, normal Patient's awareness of abnormal movements (rate only patient's report): No Awareness, Dental Status Current problems with teeth and/or dentures?: No Does patient usually wear dentures?: No  CIWA:  CIWA-Ar Total: 1 COWS:  COWS Total Score: 2  Musculoskeletal: Strength & Muscle Tone: within normal limits Gait & Station: normal Patient leans: N/A  Psychiatric Specialty Exam: SEE SRA BY MD  Physical Exam  Nursing note and vitals reviewed. Constitutional: She is oriented to person, place, and time.  Neurological: She is alert and oriented to  person, place, and time.    Review of Systems  Psychiatric/Behavioral: Positive for substance abuse. Negative for hallucinations, memory loss and suicidal ideas. Depression: improved. Nervous/anxious: improved. Insomnia: improved.   All other systems reviewed and are negative.   Blood pressure 122/80, pulse 83, temperature 98.3 F (36.8 C), temperature source Oral, resp. rate 18, height 6' (1.829 m), weight 127 kg, last menstrual period 12/06/2018, SpO2 98 %.Body mass index is 37.97 kg/m.    Have you used any form of tobacco in the last 30 days? (Cigarettes, Smokeless Tobacco, Cigars, and/or Pipes): Yes  Has this patient used any form of tobacco in the last 30 days? (Cigarettes, Smokeless Tobacco, Cigars, and/or Pipes) Yes, Yes, A prescription for an FDA-approved tobacco cessation medication was offered at discharge and the patient refused  Blood Alcohol level:  Lab Results  Component Value Date   [facility] <10 11/10/2017   ETH <10 30/06/2329    Metabolic Disorder Labs:  Lab Results  Component Value Date   HGBA1C 5.9 (H) 11/10/2017   MPG 122.63 11/10/2017   No results found for: PROLACTIN Lab Results  Component Value Date   CHOL 156 12/28/2018   TRIG 69 12/28/2018   HDL 43 12/28/2018   CHOLHDL 3.6 12/28/2018   VLDL 14 12/28/2018   LDLCALC 99 12/28/2018   LDLCALC 124 (H) 11/10/2017    See Psychiatric Specialty Exam and Suicide Risk Assessment completed by Attending Physician prior to discharge.  Discharge destination:  Home  Is patient on multiple antipsychotic therapies at discharge:  No   Has Patient had three or more failed trials of antipsychotic monotherapy by history:  No  Recommended Plan for Multiple Antipsychotic Therapies: NA   Allergies as of 01/01/2019      Reactions   Ibuprofen    Percocet [oxycodone-acetaminophen] Nausea And Vomiting   Tylenol [acetaminophen]       Medication List    TAKE these medications     Indication  FLUoxetine 20 MG  capsule Commonly known as:  PROZAC Take 1 capsule (20 mg total) by mouth daily.  Indication:  Major Depressive Disorder   hydrOXYzine 25 MG tablet Commonly known as:  ATARAX/VISTARIL Take 1 tablet (25 mg total) by mouth 3 (three) times daily as needed for anxiety.  Indication:  Feeling Anxious   QUEtiapine 400 MG tablet Commonly known as:  SEROQUEL Take 1 tablet (400 mg total) by mouth at bedtime.  Indication:  mood stabilization   traZODone 50 MG tablet Commonly known as:  DESYREL Take 1 tablet (50 mg total) by mouth at bedtime as needed for sleep.  Indication:  Trouble Sleeping      Follow-up Information    [facility] Follow up on 01/02/2019.   Why:  You will have an appointment by phone on 04/02 at  10:30am . Please answer the call. Contact information: [place] [address] [phone]        [facility] Follow up on 01/01/2019.   Why:  Please attend your ACTT services screening appointment on 01/01/2019 at [place] your hospital discharge paperwork and photo ID with you. Contact information: [place] [address] [place] [address] [phone]           Follow-up recommendations: Follow up with your outpatient provided for any medical issues. Activity & diet as recommended by your primary care provider.  Comments:  Patient is instructed prior to discharge to: Take all medications as prescribed by his/her mental healthcare provider. Report any adverse effects and or reactions from the medicines to his/her outpatient provider promptly. Patient has been instructed & cautioned: To not engage in alcohol and or illegal drug use while on prescription medicines. In the event of worsening symptoms, patient is instructed to call the crisis hotline, 911 and or go to the nearest ED for appropriate evaluation and treatment of symptoms. To follow-up with his/her primary care provider for your other medical issues, concerns and or health care  needs.  Signed: Mordecai Maes, NP 01/01/2019, 8:47 AM

## 2018-12-31 NOTE — Progress Notes (Addendum)
[facility] MD Progress Note  12/31/2018 12:49 PM Ashley FERREBEE  MRN:  [id number] Subjective:   Seen for an extensive visit as we anticipated discharge however patient would not contract Suicide risk assessment was done As discussed, Patient scheduled for discharge today yet insistent she has no housing and therefore will not contract clinically manipulative but we will monitor 1 more day for safety reasons and discharge tomorrow Principal Problem: MDD (major depressive disorder), recurrent severe, without psychosis ([place]) Diagnosis: Principal Problem:   MDD (major depressive disorder), recurrent severe, without psychosis ([place]) Active Problems:   Borderline personality disorder ([place])  Total Time spent with patient: 40  Past Medical History:  Past Medical History:  Diagnosis Date  . Anxiety   . Depression   . Shingles   . Suicidal ideations     Past Surgical History:  Procedure Laterality Date  . c-section     Family History:  Family History  Problem Relation Age of Onset  . Heart failure Mother   . Kidney failure Brother   . Diabetes Other   . Hypertension Other     Social History:  Social History   Substance and Sexual Activity  Alcohol Use No     Social History   Substance and Sexual Activity  Drug Use Yes  . Types: Marijuana, Cocaine   Comment: Pt denies    Social History   Socioeconomic History  . Marital status: Legally Separated    Spouse name: Not on file  . Number of children: Not on file  . Years of education: Not on file  . Highest education level: Not on file  Occupational History  . Not on file  Social Needs  . Financial resource strain: Not on file  . Food insecurity:    Worry: Not on file    Inability: Not on file  . Transportation needs:    Medical: Not on file    Non-medical: Not on file  Tobacco Use  . Smoking status: Current Every Day Smoker    Packs/day: 1.00    Types: Cigarettes  . Smokeless tobacco: Never Used  Substance and  Sexual Activity  . Alcohol use: No  . Drug use: Yes    Types: Marijuana, Cocaine    Comment: Pt denies  . Sexual activity: Yes    Birth control/protection: Condom  Lifestyle  . Physical activity:    Days per week: Not on file    Minutes per session: Not on file  . Stress: Not on file  Relationships  . Social connections:    Talks on phone: Not on file    Gets together: Not on file    Attends religious service: Not on file    Active member of club or organization: Not on file    Attends meetings of clubs or organizations: Not on file    Relationship status: Not on file  Other Topics Concern  . Not on file  Social History Narrative  . Not on file   Additional Social History:    Pain Medications: see MAR Prescriptions: see MAR Over the Counter: see MAR History of alcohol / drug use?: Yes Name of Substance 1: crack cocaine 1 - Age of First Use: 39 1 - Amount (size/oz): varies 1 - Frequency: daily 1 - Duration: 3 years 1 - Last Use / Amount: 12/22/2018- unknown amount                  Sleep: Good  Appetite:  Good  Current  Medications: Current Facility-Administered Medications  Medication Dose Route Frequency Provider Last Rate Last Dose  . alum & mag hydroxide-simeth (MAALOX/MYLANTA) 200-200-20 MG/5ML suspension 30 mL  30 mL Oral Q4H PRN Money, Lowry Ram, FNP   30 mL at 12/31/18 0852  . diphenhydrAMINE (BENADRYL) capsule 50 mg  50 mg Oral Q4H PRN Sharma Covert, MD   50 mg at 12/30/18 2055  . FLUoxetine (PROZAC) capsule 20 mg  20 mg Oral Daily Johnn Hai, MD   20 mg at 12/31/18 0751  . hydrOXYzine (ATARAX/VISTARIL) tablet 25 mg  25 mg Oral TID PRN Money, Lowry Ram, FNP   25 mg at 12/31/18 0752  . magnesium hydroxide (MILK OF MAGNESIA) suspension 30 mL  30 mL Oral Daily PRN Money, Darnelle Maffucci B, FNP   30 mL at 12/30/18 0931  . nicotine (NICODERM CQ - dosed in mg/24 hours) patch 21 mg  21 mg Transdermal Daily Sharma Covert, MD   21 mg at 12/31/18 0753  .  QUEtiapine (SEROQUEL) tablet 400 mg  400 mg Oral QHS Johnn Hai, MD      . traZODone (DESYREL) tablet 50 mg  50 mg Oral QHS PRN Money, Lowry Ram, FNP   50 mg at 12/30/18 2055    Lab Results: No results found for this or any previous visit (from the past 48 hour(s)).  Blood Alcohol level:  Lab Results  Component Value Date   ETH <10 11/10/2017   ETH <10 16/96/7893    Metabolic Disorder Labs: Lab Results  Component Value Date   HGBA1C 5.9 (H) 11/10/2017   MPG 122.63 11/10/2017   No results found for: PROLACTIN Lab Results  Component Value Date   CHOL 156 12/28/2018   TRIG 69 12/28/2018   HDL 43 12/28/2018   CHOLHDL 3.6 12/28/2018   VLDL 14 12/28/2018   LDLCALC 99 12/28/2018   LDLCALC 124 (H) 11/10/2017    Physical Findings: AIMS: Facial and Oral Movements Muscles of Facial Expression: None, normal Lips and Perioral Area: None, normal Jaw: None, normal Tongue: None, normal,Extremity Movements Upper (arms, wrists, hands, fingers): None, normal Lower (legs, knees, ankles, toes): None, normal, Trunk Movements Neck, shoulders, hips: None, normal, Overall Severity Severity of abnormal movements (highest score from questions above): None, normal Incapacitation due to abnormal movements: None, normal Patient's awareness of abnormal movements (rate only patient's report): No Awareness, Dental Status Current problems with teeth and/or dentures?: No Does patient usually wear dentures?: No  CIWA:  CIWA-Ar Total: 1 COWS:  COWS Total Score: 2  Musculoskeletal: Strength & Muscle Tone: within normal limits Gait & Station: normal Patient leans: N/A  Psychiatric Specialty Exam: Physical Exam  ROS  Blood pressure 112/73, pulse (!) 108, temperature 98.1 F (36.7 C), temperature source Oral, resp. rate 18, height 6' (1.829 m), weight 127 kg, last menstrual period 12/06/2018, SpO2 98 %.Body mass index is 37.97 kg/m.  General Appearance: Casual  Eye Contact:  Good  Speech:  Clear  and Coherent  Volume:  Normal  Mood:  Euthymic  Affect:  Appropriate and Congruent  Thought Process:  Coherent  Orientation:  Full (Time, Place, and Person)  Thought Content:  Logical  Suicidal Thoughts:  No  Homicidal Thoughts:  No  Memory:  Immediate;   Good  Judgement:  Good  Insight:  Good  Psychomotor Activity:  Normal  Concentration:  Concentration: Good and Attention Span: Good  Recall:  Good  Fund of Knowledge:  Good  Language:  Good  Akathisia:  Negative  Handed:  Right  AIMS (if indicated):     Assets:  Physical Health Resilience  ADL's:  Intact  Cognition:  WNL  Sleep:  Number of Hours: 6.75     Treatment Plan Summary: Daily contact with patient to assess and evaluate symptoms and progress in treatment, Medication management and Plan Continue current precautions discharge tomorrow no change in meds  Johnn Hai, MD 12/31/2018, 12:49 PM

## 2019-01-01 NOTE — Progress Notes (Signed)
Discharge Note:  Patient discharged.  Patient denied SI and HI.  Denied A/V hallucinations.  Denied pain.  Suicide prevention information given and discussed with patient who stated she understood and had no questions.  My3 suicide prevention information also given to patient.  Survey given to patient to fill out.  Patient stated she appreciated all assistance received from [facility] staff.  Patient stated she received all her belongings, clothing, toiletries, misc items, etc.  All discharge information given to patient as required.

## 2019-01-01 NOTE — Progress Notes (Signed)
Recreation Therapy Notes  Date:  4.1.20 Time: 0930 Location: [address] Dayroom  Group Topic: Stress Management  Goal Area(s) Addresses:  Patient will identify positive stress management techniques. Patient will identify benefits of using stress management post d/c.  Behavioral Response:  Engaged  Intervention: Stress Management  Activity :  Meditation.  LRT played a meditation that focused on letting go.  Patients were to listen as the meditation as it played to engage in the activity.  Education:  Stress Management, Discharge Planning.   Education Outcome: Acknowledges Education  Clinical Observations/Feedback:  Pt attended and participated in group.    Victorino Sparrow, LRT/CTRS    Victorino Sparrow A 01/01/2019 10:39 AM

## 2019-01-01 NOTE — Progress Notes (Signed)
  [facility] Adult Case Management Discharge Plan :  Will you be returning to the same living situation after discharge:  No. Staying with a friend.  At discharge, do you have transportation home?: Yes,  taking the bus Do you have the ability to pay for your medications: Yes,  Medicaid  Release of information consent forms completed and in the chart. Patient to Follow up at: Follow-up Information    [facility] Follow up on 01/02/2019.   Why:  You will have an appointment by phone on 04/02 at 10:30am . Please answer the call. Contact information: [place] [address] [phone]        [facility] Follow up on 01/01/2019.   Why:  Please attend your ACTT services screening appointment on 01/01/2019 at [place] your hospital discharge paperwork and photo ID with you. Contact information: [address] [place] [address] [phone]           Next level of care provider has access to [place] and Suicide Prevention discussed: Yes,  with patient.  Have you used any form of tobacco in the last 30 days? (Cigarettes, Smokeless Tobacco, Cigars, and/or Pipes): Yes  Has patient been referred to the Quitline?: Patient refused referral  Patient has been referred for addiction treatment: Yes  Joellen Jersey, [place] 01/01/2019, 8:51 AM

## 2019-01-01 NOTE — BHH Suicide Risk Assessment (Signed)
[facility] Discharge Suicide Risk Assessment    Principal Problem: MDD (major depressive disorder), recurrent severe, without psychosis ([place]) Discharge Diagnoses: Principal Problem:   MDD (major depressive disorder), recurrent severe, without psychosis ([place]) Active Problems:   Borderline personality disorder ([place])   Total Time spent with patient: 45 minutes  Currently is alert oriented fully cooperative without thoughts of harming self or others, no psychosis.  Mental Status Per Nursing Assessment::   On Admission:  Suicidal ideation indicated by patient  Demographic Factors:  Caucasian  Loss Factors: Decrease in vocational status  Historical Factors: Impulsivity  Risk Reduction Factors:   Sense of responsibility to family and Religious beliefs about death  Continued Clinical Symptoms:  Depression:   Severe  Cognitive Features That Contribute To Risk:  Polarized thinking    Suicide Risk:  Minimal: No identifiable suicidal ideation.  Patients presenting with no risk factors but with morbid ruminations; may be classified as minimal risk based on the severity of the depressive symptoms     Follow-up Information    [facility] Follow up.   Why:  You will have an appointment by phone on 04/02 at 10:30am . Please answer the call. Contact information: [place] [address] [phone]        [facility] Follow up.   Why:  Please attend your ACTT services screening appointment on 01/01/2019 at [place] your hospital discharge paperwork and photo ID with you. Contact information: [address] [place] [address] [phone]     Johnn Hai, MD 01/01/2019, 9:55 AM

## 2019-01-01 NOTE — Progress Notes (Signed)
Patient shared with the group that she slept for much of the day which she attributed to her medication. Her goal for tomorrow is to get discharged. She explained that she presented her discharge plans to the staff and set up things on her own.

## 2019-01-01 NOTE — Progress Notes (Signed)
D:  Patient's self inventory sheet, patient has fair sleep, sleep medication helpful.  Good appetite, low energy level, fair concentration.  Rated depression 7, hopeless 6, anxiety 8.  Denied withdrawals, cravings checks, irritability checked.  Denied SI.  Physical pain back, worst pain #9 in past 24 hours.  Goal is [facility].  Plans to stay focused/positive.  Does have discharge plans.  Needs transportation. A:  Medications administered per MD orders.  Emotional support and encouragement given patient. R:  Denied SI and HI, contracts for safety.  Denied A/V hallucinations.  Safety maintained with 15 minute checks.

## 2019-02-11 ENCOUNTER — Emergency Department (HOSPITAL_COMMUNITY)
Admission: EM | Admit: 2019-02-11 | Discharge: 2019-02-12 | Disposition: A | Discharge status: Home / Self Care | Payer: Medicaid Other | Attending: Emergency Medicine | Admitting: Emergency Medicine

## 2019-02-11 ENCOUNTER — Other Ambulatory Visit: Payer: Self-pay

## 2019-02-11 ENCOUNTER — Ambulatory Visit (HOSPITAL_COMMUNITY)
Admission: RE | Admit: 2019-02-11 | Discharge: 2019-02-11 | Disposition: A | Discharge status: Home / Self Care | Payer: Medicaid Other | Attending: Psychiatry | Admitting: Psychiatry

## 2019-02-11 ENCOUNTER — Encounter (HOSPITAL_COMMUNITY): Payer: Self-pay

## 2019-02-11 DIAGNOSIS — F332 Major depressive disorder, recurrent severe without psychotic features: Secondary | ICD-10-CM | POA: Insufficient documentation

## 2019-02-11 DIAGNOSIS — G4700 Insomnia, unspecified: Secondary | ICD-10-CM | POA: Insufficient documentation

## 2019-02-11 DIAGNOSIS — Z7289 Other problems related to lifestyle: Secondary | ICD-10-CM | POA: Insufficient documentation

## 2019-02-11 DIAGNOSIS — F1410 Cocaine abuse, uncomplicated: Secondary | ICD-10-CM | POA: Insufficient documentation

## 2019-02-11 DIAGNOSIS — Z046 Encounter for general psychiatric examination, requested by authority: Secondary | ICD-10-CM | POA: Insufficient documentation

## 2019-02-11 DIAGNOSIS — R4587 Impulsiveness: Secondary | ICD-10-CM | POA: Diagnosis not present

## 2019-02-11 DIAGNOSIS — Z9114 Patient's other noncompliance with medication regimen: Secondary | ICD-10-CM | POA: Insufficient documentation

## 2019-02-11 DIAGNOSIS — F419 Anxiety disorder, unspecified: Secondary | ICD-10-CM | POA: Insufficient documentation

## 2019-02-11 DIAGNOSIS — R450 Nervousness: Secondary | ICD-10-CM | POA: Insufficient documentation

## 2019-02-11 DIAGNOSIS — F319 Bipolar disorder, unspecified: Secondary | ICD-10-CM | POA: Insufficient documentation

## 2019-02-11 DIAGNOSIS — I10 Essential (primary) hypertension: Secondary | ICD-10-CM | POA: Insufficient documentation

## 2019-02-11 DIAGNOSIS — R45851 Suicidal ideations: Secondary | ICD-10-CM | POA: Diagnosis not present

## 2019-02-11 DIAGNOSIS — Z79899 Other long term (current) drug therapy: Secondary | ICD-10-CM | POA: Insufficient documentation

## 2019-02-11 DIAGNOSIS — F17210 Nicotine dependence, cigarettes, uncomplicated: Secondary | ICD-10-CM | POA: Insufficient documentation

## 2019-02-11 MED ORDER — LORAZEPAM 1 MG PO TABS
2.0000 mg | ORAL_TABLET | Freq: Once | ORAL | Status: AC
  Administered 2019-02-12: 2 mg via ORAL
  Filled 2019-02-11: qty 2

## 2019-02-11 NOTE — ED Notes (Signed)
Pt dressed out and placed on a five lead.
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
## 2019-02-11 NOTE — ED Triage Notes (Addendum)
Pt states that she went across the street to [facility], but they said that she needed to get her BP under control so she needed to come here first. Pt reports increased anxiety, feelings of worthlessness, and depression. She endorses some passive SI, but states that she knows where to go for help. She reports that she has "services" set up for Tuesday, but didn't feel like she could make it until then without seeing someone. She emphasized that she is only here for her blood pressure. She states that she does not want to be here for psych reasons and would like to go back over to [facility] when her BP is under control. A&Ox4. Ambulatory.

## 2019-02-11 NOTE — ED Notes (Signed)
Patient states she has had increased anxiety and depression over the last couple of days, starting on mother's day. Patient states that she has lost her support people over the last couple of years, with her mother being one of them so mother's day was triggering for her. She is starting the [facility] Care team on Tuesday, but states that she wants to go to [facility] to have her medications regulated. She has not been able to get in with her psychiatrist because of covid-19 so she has been off of her depression and anxiety medications the past month. Patient denies SI and HI, stating "I can tell when I am starting to get feelings of worthlessness and I don't want to go there again."

## 2019-02-12 ENCOUNTER — Encounter (HOSPITAL_COMMUNITY): Payer: Self-pay | Admitting: *Deleted

## 2019-02-12 ENCOUNTER — Inpatient Hospital Stay (HOSPITAL_COMMUNITY)
Admission: AD | Admit: 2019-02-12 | Discharge: 2019-02-17 | DRG: 885 | Disposition: A | Discharge status: Home / Self Care | Payer: Medicaid Other | Source: Intra-hospital | Attending: Psychiatry | Admitting: Psychiatry

## 2019-02-12 ENCOUNTER — Other Ambulatory Visit: Payer: Self-pay

## 2019-02-12 DIAGNOSIS — N760 Acute vaginitis: Secondary | ICD-10-CM | POA: Diagnosis present

## 2019-02-12 DIAGNOSIS — F319 Bipolar disorder, unspecified: Secondary | ICD-10-CM | POA: Diagnosis present

## 2019-02-12 DIAGNOSIS — Z590 Homelessness: Secondary | ICD-10-CM | POA: Diagnosis not present

## 2019-02-12 DIAGNOSIS — F17210 Nicotine dependence, cigarettes, uncomplicated: Secondary | ICD-10-CM | POA: Diagnosis present

## 2019-02-12 DIAGNOSIS — B9689 Other specified bacterial agents as the cause of diseases classified elsewhere: Secondary | ICD-10-CM | POA: Diagnosis present

## 2019-02-12 DIAGNOSIS — F332 Major depressive disorder, recurrent severe without psychotic features: Secondary | ICD-10-CM

## 2019-02-12 DIAGNOSIS — F419 Anxiety disorder, unspecified: Secondary | ICD-10-CM | POA: Diagnosis present

## 2019-02-12 DIAGNOSIS — F1994 Other psychoactive substance use, unspecified with psychoactive substance-induced mood disorder: Secondary | ICD-10-CM | POA: Diagnosis present

## 2019-02-12 DIAGNOSIS — R45851 Suicidal ideations: Secondary | ICD-10-CM | POA: Diagnosis present

## 2019-02-12 LAB — CBC WITH DIFFERENTIAL/PLATELET
Abs Immature Granulocytes: 0.08 10*3/uL — ABNORMAL HIGH (ref 0.00–0.07)
Basophils Absolute: 0.1 10*3/uL (ref 0.0–0.1)
Basophils Relative: 1 %
Eosinophils Absolute: 0.2 10*3/uL (ref 0.0–0.5)
Eosinophils Relative: 1 %
HCT: 44.5 % (ref 36.0–46.0)
Hemoglobin: 13.7 g/dL (ref 12.0–15.0)
Immature Granulocytes: 1 %
Lymphocytes Relative: 23 %
Lymphs Abs: 2.8 10*3/uL (ref 0.7–4.0)
MCH: 26.2 pg (ref 26.0–34.0)
MCHC: 30.8 g/dL (ref 30.0–36.0)
MCV: 85.2 fL (ref 80.0–100.0)
Monocytes Absolute: 0.8 10*3/uL (ref 0.1–1.0)
Monocytes Relative: 7 %
Neutro Abs: 8 10*3/uL — ABNORMAL HIGH (ref 1.7–7.7)
Neutrophils Relative %: 67 %
Platelets: 370 10*3/uL (ref 150–400)
RBC: 5.22 MIL/uL — ABNORMAL HIGH (ref 3.87–5.11)
RDW: 14.3 % (ref 11.5–15.5)
WBC: 11.9 10*3/uL — ABNORMAL HIGH (ref 4.0–10.5)
nRBC: 0 % (ref 0.0–0.2)

## 2019-02-12 LAB — COMPREHENSIVE METABOLIC PANEL
ALT: 7 U/L (ref 0–44)
AST: 15 U/L (ref 15–41)
Albumin: 3.5 g/dL (ref 3.5–5.0)
Alkaline Phosphatase: 74 U/L (ref 38–126)
Anion gap: 6 (ref 5–15)
BUN: 8 mg/dL (ref 6–20)
CO2: 25 mmol/L (ref 22–32)
Calcium: 9 mg/dL (ref 8.9–10.3)
Chloride: 106 mmol/L (ref 98–111)
Creatinine, Ser: 1.04 mg/dL — ABNORMAL HIGH (ref 0.44–1.00)
GFR calc Af Amer: >60 mL/min (ref >60)
GFR calc non Af Amer: >60 mL/min (ref >60)
Glucose, Bld: 110 mg/dL — ABNORMAL HIGH (ref 70–99)
Potassium: 4.5 mmol/L (ref 3.5–5.1)
Sodium: 137 mmol/L (ref 135–145)
Total Bilirubin: 0.2 mg/dL — ABNORMAL LOW (ref 0.3–1.2)
Total Protein: 6.8 g/dL (ref 6.5–8.1)

## 2019-02-12 LAB — RAPID URINE DRUG SCREEN, HOSP PERFORMED
Amphetamines: NOT DETECTED
Barbiturates: NOT DETECTED
Benzodiazepines: NOT DETECTED
Cocaine: POSITIVE — AB
Opiates: NOT DETECTED
Tetrahydrocannabinol: NOT DETECTED

## 2019-02-12 LAB — I-STAT BETA HCG BLOOD, ED (MC, WL, AP ONLY): I-stat hCG, quantitative: <5 m[IU]/mL (ref <5)

## 2019-02-12 MED ORDER — ZIPRASIDONE MESYLATE 20 MG IM SOLR: 20.0000 mg | INTRAMUSCULAR | Status: DC | PRN

## 2019-02-12 MED ORDER — HYDROXYZINE HCL 25 MG PO TABS
25.0000 mg | ORAL_TABLET | Freq: Four times a day (QID) | ORAL | Status: DC | PRN
  Administered 2019-02-12 – 2019-02-17 (×10): 25 mg via ORAL
  Filled 2019-02-12 (×12): qty 1

## 2019-02-12 MED ORDER — ALUM & MAG HYDROXIDE-SIMETH 200-200-20 MG/5ML PO SUSP: 30.0000 mL | ORAL | Status: DC | PRN

## 2019-02-12 MED ORDER — TRAZODONE HCL 50 MG PO TABS: 50.0000 mg | ORAL_TABLET | Freq: Every evening | ORAL | Status: DC | PRN

## 2019-02-12 MED ORDER — RISPERIDONE 1 MG PO TBDP: 2.0000 mg | ORAL_TABLET | Freq: Three times a day (TID) | ORAL | Status: DC | PRN

## 2019-02-12 MED ORDER — FLUOXETINE HCL 10 MG PO CAPS
10.0000 mg | ORAL_CAPSULE | Freq: Every day | ORAL | Status: DC
  Administered 2019-02-12 – 2019-02-14 (×3): 10 mg via ORAL
  Filled 2019-02-12 (×5): qty 1

## 2019-02-12 MED ORDER — HYDROXYZINE HCL 25 MG PO TABS: 25.0000 mg | ORAL_TABLET | Freq: Three times a day (TID) | ORAL | Status: DC | PRN

## 2019-02-12 MED ORDER — FLUOXETINE HCL 20 MG PO CAPS: 20.0000 mg | ORAL_CAPSULE | Freq: Every day | ORAL | Status: DC

## 2019-02-12 MED ORDER — TRAZODONE HCL 100 MG PO TABS
100.0000 mg | ORAL_TABLET | Freq: Every evening | ORAL | Status: DC | PRN
  Filled 2019-02-12 (×2): qty 1

## 2019-02-12 MED ORDER — ACETAMINOPHEN 325 MG PO TABS: 650.0000 mg | ORAL_TABLET | ORAL | Status: DC | PRN

## 2019-02-12 MED ORDER — LORAZEPAM 1 MG PO TABS: 1.0000 mg | ORAL_TABLET | ORAL | Status: DC | PRN

## 2019-02-12 MED ORDER — QUETIAPINE FUMARATE 50 MG PO TABS
50.0000 mg | ORAL_TABLET | Freq: Every day | ORAL | Status: DC
  Administered 2019-02-12: 50 mg via ORAL
  Filled 2019-02-12 (×3): qty 1

## 2019-02-12 MED ORDER — IBUPROFEN 600 MG PO TABS
600.0000 mg | ORAL_TABLET | Freq: Four times a day (QID) | ORAL | Status: DC | PRN
  Administered 2019-02-12 – 2019-02-17 (×9): 600 mg via ORAL
  Filled 2019-02-12 (×9): qty 1

## 2019-02-12 MED ORDER — MAGNESIUM HYDROXIDE 400 MG/5ML PO SUSP
30.0000 mL | Freq: Every day | ORAL | Status: DC | PRN
  Administered 2019-02-16: 30 mL via ORAL
  Filled 2019-02-12: qty 30

## 2019-02-12 MED ORDER — ZOLPIDEM TARTRATE 5 MG PO TABS: 5.0000 mg | ORAL_TABLET | Freq: Every evening | ORAL | Status: DC | PRN

## 2019-02-12 NOTE — ED Notes (Signed)
[facility] contacted for transport to [facility].

## 2019-02-12 NOTE — Progress Notes (Signed)
Pt. Is a 43 year female voluntary admission for increasing depression and cocaine use.  Pt. States that she has not been able to get her medication refilled which has led to the increasing depression, she states that once she is in that state she "can't get out of it" and turns to substance use.  Pt. Denies SI, HI or AVH.  Pt. 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  Pt. Denies any other substance/alcohol use, states she smokes 4-5 cigarettes/day.  Pt. C/o chronic neck and back pain that is ongoing for "years" and states that she has yeast under bilateral breasts, panus and feels she has a vaginal yeast infection r/t antibiotic use.  Pt. States that she does not have any support but has an upcoming appointment with an ACT team next week.  Pt. Was oriented to the unit without incident and with safety maintained.

## 2019-02-12 NOTE — Plan of Care (Signed)
  Problem: Coping: Goal: Ability to verbalize frustrations and anger appropriately will improve Outcome: Progressing   D: Pt alert and oriented on the unit. Pt engaging with RN staff and other pts. Pt denies SI/HI, A/VH. Pt was asleep and isolative during the morning and talked on the telephone later during the afternoon. Pt is cooperative. A: Education, support and encouragement provided, q15 minute safety checks remain in effect. Medications administered per MD orders. R: No reactions/side effects to medicine noted. Pt denies any concerns at this time, and verbally contracts for safety. Pt ambulating on the unit with no issues. Pt remains safe on and off the unit.

## 2019-02-12 NOTE — ED Notes (Signed)
Patient made aware that urine sample is needed.  

## 2019-02-12 NOTE — H&P (Signed)
Behavioral Health Medical Screening Exam  Ashley Stevens is an 43 y.o. female with hx of cocaine use, presenting as a walk-in to [facility], she is endorsing depressive sx, along with ongoing illicit drug use (Cocaine) she was also found to have an elevated BP, denies hx of HTN, vertigo, N/V, HA, CP or SOB.  Total Time spent with patient: 15 minutes  Psychiatric Specialty Exam: Physical Exam  Constitutional: She is oriented to person, place, and time. She appears well-developed and well-nourished. No distress.  HENT:  Head: Normocephalic.  Eyes: Pupils are equal, round, and reactive to light.  Respiratory: Effort normal and breath sounds normal. No respiratory distress.  Neurological: She is alert and oriented to person, place, and time. No cranial nerve deficit.  Skin: Skin is warm and dry. She is not diaphoretic.  Psychiatric: Her speech is normal. Her mood appears anxious. She is withdrawn. Cognition and memory are impaired. She expresses impulsivity. She exhibits a depressed mood. She expresses suicidal ideation. She expresses suicidal plans.    Review of Systems  Constitutional: Negative for chills, diaphoresis, fever, malaise/fatigue and weight loss.  Psychiatric/Behavioral: Positive for depression, substance abuse and suicidal ideas. Negative for hallucinations and memory loss. The patient is nervous/anxious and has insomnia.   All other systems reviewed and are negative.   Blood pressure (!) 157/106, pulse (!) 113, temperature 97.6 F (36.4 C), temperature source Oral, resp. rate 18, SpO2 100 %.There is no height or weight on file to calculate BMI.  General Appearance: Casual  Eye Contact:  Good  Speech:  Clear and Coherent  Volume:  Normal  Mood:  Depressed  Affect:  Congruent  Thought Process:  Goal Directed  Orientation:  Full (Time, Place, and Person)  Thought Content:  Logical  Suicidal Thoughts:  Yes.  with intent/plan  Homicidal Thoughts:  No  Memory:  Immediate;    Fair  Judgement:  Poor  Insight:  Lacking  Psychomotor Activity:  Normal  Concentration: Concentration: Fair  Recall:  [place]: Fair  Akathisia:  Negative  Handed:  Right  AIMS (if indicated):     Assets:  Desire for Improvement  Sleep:       Musculoskeletal: Strength & Muscle Tone: within normal limits Gait & Station: normal Patient leans: N/A  Blood pressure (!) 157/106, pulse (!) 113, temperature 97.6 F (36.4 C), temperature source Oral, resp. rate 18, SpO2 100 %.  Recommendations:  Based on my evaluation the patient appears to have an emergency medical condition for which I recommend the patient be transferred to the emergency department for further evaluation.  Laverle Hobby, PA-C 02/12/2019, 2:11 AM

## 2019-02-12 NOTE — BHH Suicide Risk Assessment (Signed)
[facility] INPATIENT:  Family/Significant Other Suicide Prevention Education  Suicide Prevention Education:  Patient Refusal for Family/Significant Other Suicide Prevention Education: The patient Ashley Stevens has refused to provide written consent for family/significant other to be provided Family/Significant Other Suicide Prevention Education during admission and/or prior to discharge.  Physician notified.  Trecia Rogers 02/12/2019, 3:04 PM

## 2019-02-12 NOTE — ED Notes (Signed)
Patient A&O x4 and ambulatory at discharge. Patient accompanied by [facility] to [facility]. Verbalized permission for RN to sign for discharge.
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
## 2019-02-12 NOTE — ED Notes (Signed)
Bed: KK44 Expected date:  Expected time:  Means of arrival:  Comments: Pt in room 4

## 2019-02-12 NOTE — Progress Notes (Signed)
D: Pt was in bed in her room upon initial approach.  Pt presents with depressed affect and mood.  She forwards little information and reports she is "tired."  Pt denies SI/HI, denies hallucinations, reports chronic back pain of 9/10.  Pt has been isolative to her room for the majority of the evening.  A: Introduced self to pt.  Met with pt 1:1.  Actively listened to pt and offered support and encouragement.  Medication administered per order.  PRN medication administered for pain.  Q15 minute safety checks in place.  R: Pt is safe on the unit.  Pt is compliant with medications.  Pt verbally contracts for safety.  Will continue to monitor and assess.
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
## 2019-02-12 NOTE — Progress Notes (Signed)
Patient ID: Ashley Stevens, female   [place] CORONAVIRUS (COVID-19) DAILY CHECK-OFF SYMPTOMS - answer yes or no to each - every day NO YES  Have you had a fever in the past 24 hours?  . Fever (Temp > 37.80C / 100F) X   Have you had any of these symptoms in the past 24 hours? . New Cough .  Sore Throat  .  Shortness of Breath .  Difficulty Breathing .  Unexplained Body Aches   X   Have you had any one of these symptoms in the past 24 hours not related to allergies?   . Runny Nose .  Nasal Congestion .  Sneezing   X   If you have had runny nose, nasal congestion, sneezing in the past 24 hours, has it worsened?  X   EXPOSURES - check yes or no X   Have you traveled outside the state in the past 14 days?  X   Have you been in contact with someone with a confirmed diagnosis of COVID-19 or PUI in the past 14 days without wearing appropriate PPE?  X   Have you been living in the same home as a person with confirmed diagnosis of COVID-19 or a PUI (household contact)?    X   Have you been diagnosed with COVID-19?    X              What to do next: Answered NO to all: Answered YES to anything:   Proceed with unit schedule Follow the [facility] Inpatient Flowsheet.  : 01/20/1976, 43 y.o.   MRN: [id number]

## 2019-02-12 NOTE — ED Provider Notes (Signed)
[facility] DEPT Provider Note   CSN: [id number] Arrival date & time: 02/11/19  2234    History   Chief Complaint Chief Complaint  Patient presents with   Hypertension    HPI NICKOL COLLISTER is a 43 y.o. female.     HPI 43 year old female comes into the ER with chief complaint of depression and suicidal thoughts.  Patient states that she has not been taking her medications for the last few days.  She used cocaine earlier today and has had some thoughts of hurting herself.  After the use of cocaine, she felt like it was better for her to get checked out and treated at behavioral health rather than waiting to get worse. She states that she is not taking her medication because she ran out of them.  She has an appointment with her psychiatrist next week, but does not think she can wait that long.  Patient denies any chest pain, abdominal pain, nausea, vomiting, headaches, new focal neurologic symptoms.  She admits to cocaine use earlier today.  She went to the [facility] hospital and her blood pressure was elevated, she was advised to come to the ER for medical clearance.   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      Home Medications    Prior to Admission medications   Medication Sig Start Date End Date Taking? Authorizing Provider  FLUoxetine (PROZAC) 20 MG capsule Take 1 capsule (20 mg total) by mouth daily. 01/01/19  Yes Mordecai Maes, NP    GARLIC PO Take 1 capsule by mouth daily.   Yes [provider]  hydrOXYzine (ATARAX/VISTARIL) 25 MG tablet Take 1 tablet (25 mg total) by mouth 3 (three) times daily as needed for anxiety. 12/31/18  Yes Mordecai Maes, NP  saccharomyces boulardii (FLORASTOR) 250 MG capsule Take 250 mg by mouth daily.   Yes [provider]  traZODone (DESYREL) 50 MG tablet Take 1 tablet (50 mg total) by mouth at bedtime as needed for sleep. 12/31/18  Yes Mordecai Maes, NP  vitamin B-12 (CYANOCOBALAMIN) 1000 MCG tablet Take 1,000 mcg by mouth daily.   Yes [provider]  QUEtiapine (SEROQUEL) 400 MG tablet Take 1 tablet (400 mg total) by mouth at bedtime. Patient not taking: Reported on 02/12/2019 12/31/18   Mordecai Maes, NP    Family History Family History  Problem Relation Age of Onset   Heart failure Mother    Kidney failure Brother    Diabetes Other    Hypertension Other     Social History Social History   Tobacco Use   Smoking status: Current Every Day Smoker    Packs/day: 1.00    Types: Cigarettes   Smokeless tobacco: Never Used  Substance Use Topics   Alcohol use: No   Drug use: Yes    Types: Marijuana, Cocaine    Comment: Pt denies     Allergies   Percocet [oxycodone-acetaminophen]   Review of  Systems Review of Systems  Constitutional: Positive for activity change.  Respiratory: Negative for shortness of breath.   Cardiovascular: Negative for chest pain.  Gastrointestinal: Negative for nausea and vomiting.  Neurological: Negative for headaches.  All other systems reviewed and are negative.    Physical Exam Updated Vital Signs BP (!) 147/74 (BP Location: Right Arm)    Pulse 81    Temp 98.3 F (36.8 C) (Oral)    Resp (!) 23    SpO2 99%   Physical Exam Vitals signs and nursing note reviewed.  Constitutional:      Appearance: She is well-developed.  HENT:     Head: Normocephalic and atraumatic.  Neck:     Musculoskeletal: Normal  range of motion and neck supple.  Cardiovascular:     Rate and Rhythm: Normal rate.  Pulmonary:     Effort: Pulmonary effort is normal.  Abdominal:     General: Bowel sounds are normal.  Skin:    General: Skin is warm and dry.  Neurological:     Mental Status: She is alert and oriented to person, place, and time.  Psychiatric:        Mood and Affect: Mood normal.        Behavior: Behavior normal.        Thought Content: Thought content normal.        Judgment: Judgment normal.      ED Treatments / Results  Labs (all labs ordered are listed, but only abnormal results are displayed) Labs Reviewed  COMPREHENSIVE METABOLIC PANEL - Abnormal; Notable for the following components:      Result Value   Glucose, Bld 110 (*)    Creatinine, Ser 1.04 (*)    Total Bilirubin 0.2 (*)    All other components within normal limits  CBC WITH DIFFERENTIAL/PLATELET - Abnormal; Notable for the following components:   WBC 11.9 (*)    RBC 5.22 (*)    Neutro Abs 8.0 (*)    Abs Immature Granulocytes 0.08 (*)    All other components within normal limits  RAPID URINE DRUG SCREEN, HOSP PERFORMED - Abnormal; Notable for the following components:   Cocaine POSITIVE (*)    All other components within normal limits  I-STAT BETA HCG BLOOD, ED ([facility], [facility], [facility] ONLY)  I-STAT BETA HCG BLOOD, ED ([facility], [facility], [facility] ONLY)    EKG None ED ECG REPORT   Date: 02/12/2019  Rate: 84  Rhythm: normal sinus rhythm  QRS Axis: normal  Intervals: normal  ST/T Wave abnormalities: nonspecific ST/T changes  Conduction Disutrbances:none  Narrative Interpretation:   Old EKG Reviewed: unchanged  I have personally reviewed the EKG tracing and agree with the computerized printout as noted.   Radiology No results found.  Procedures Procedures (including critical care time)  Medications Ordered in ED Medications  risperiDONE (RISPERDAL M-TABS) disintegrating tablet 2 mg (has no administration in time range)    And    LORazepam (ATIVAN) tablet 1 mg (has no administration in time range)    And  ziprasidone (GEODON) injection 20 mg (has no administration in time range)  acetaminophen (TYLENOL) tablet 650 mg (has no administration in time range)  hydrOXYzine (ATARAX/VISTARIL) tablet 25 mg (has no administration in time range)  traZODone (DESYREL) tablet 50 mg (has no administration in time range)  FLUoxetine (PROZAC) capsule 20 mg (has no administration in time range)  LORazepam (ATIVAN) tablet 2 mg (2 mg Oral Given 02/12/19 0018)     Initial Impression / Assessment  and Plan / ED Course  I have reviewed the triage vital signs and the nursing notes.  Pertinent labs & imaging results that were available during my care of the patient were reviewed by me and considered in my medical decision making (see chart for details).        43 year old female comes to the ER with chief complaint of elevated blood pressure.  She also has been having worsening of her depression.  She denies any overt SI, but states that if she does not get help she will get to that place. She also admits to cocaine use earlier today.  She has no medical complaints at this time and has been medically cleared for psychiatric evaluation.  Labs are reassuring besides UDS being positive. Her hypertension is likely related to cocaine use and anxiety.  Patient does not have known history of hypertension.  She does not meet the criteria for starting new BP medications under the circumstances of her cocaine use.   If her BP continues to be elevated for extended period of time next medication of choice would be 25 mg HCTZ.  Final Clinical Impressions(s) / ED Diagnoses   Final diagnoses:  Hypertension, unspecified type  Cocaine abuse ([place])  Severe episode of recurrent major depressive disorder, without psychotic features [facility])    ED Discharge Orders    None       Varney Biles, MD 02/12/19 0310

## 2019-02-12 NOTE — Progress Notes (Signed)
Pt was invited to group via intercom, and the MHT went room to room to inform pts about group. Pt chose not to attend goals/orientation group.

## 2019-02-12 NOTE — Tx Team (Signed)
Interdisciplinary Treatment and Diagnostic Plan Update  02/12/2019 Time of Session: 9:19am Ashley Stevens MRN: [id number]  Principal Diagnosis: <principal problem not specified>  Secondary Diagnoses: Active Problems:   Substance induced mood disorder (HCC)   Current Medications:  Current Facility-Administered Medications  Medication Dose Route Frequency Provider Last Rate Last Dose  . alum & mag hydroxide-simeth (MAALOX/MYLANTA) 200-200-20 MG/5ML suspension 30 mL  30 mL Oral Q4H PRN Patriciaann Clan E, PA-C      . hydrOXYzine (ATARAX/VISTARIL) tablet 25 mg  25 mg Oral Q6H PRN Patriciaann Clan E, PA-C      . magnesium hydroxide (MILK OF MAGNESIA) suspension 30 mL  30 mL Oral Daily PRN Laverle Hobby, PA-C      . traZODone (DESYREL) tablet 100 mg  100 mg Oral QHS,MR X 1 Simon, Spencer E, PA-C       PTA Medications: Medications Prior to Admission  Medication Sig Dispense Refill Last Dose  . FLUoxetine (PROZAC) 20 MG capsule Take 1 capsule (20 mg total) by mouth daily. 30 capsule 0 Past Month at Unknown time  . GARLIC PO Take 1 capsule by mouth daily.   Past Month at Unknown time  . hydrOXYzine (ATARAX/VISTARIL) 25 MG tablet Take 1 tablet (25 mg total) by mouth 3 (three) times daily as needed for anxiety. 30 tablet 0 Past Month at Unknown time  . QUEtiapine (SEROQUEL) 400 MG tablet Take 1 tablet (400 mg total) by mouth at bedtime. (Patient not taking: Reported on 02/12/2019) 30 tablet 0 Not Taking at Unknown time  . saccharomyces boulardii (FLORASTOR) 250 MG capsule Take 250 mg by mouth daily.   Past Week at Unknown time  . traZODone (DESYREL) 50 MG tablet Take 1 tablet (50 mg total) by mouth at bedtime as needed for sleep. 30 tablet 0 Past Month at Unknown time  . vitamin B-12 (CYANOCOBALAMIN) 1000 MCG tablet Take 1,000 mcg by mouth daily.   Past Week at Unknown time    Patient Stressors:    Patient Strengths:    Treatment Modalities: Medication Management, Group therapy, Case  management,  1 to 1 session with clinician, Psychoeducation, Recreational therapy.   Physician Treatment Plan for Primary Diagnosis: <principal problem not specified> Long Term Goal(s):     Short Term Goals:    Medication Management: Evaluate patient's response, side effects, and tolerance of medication regimen.  Therapeutic Interventions: 1 to 1 sessions, Unit Group sessions and Medication administration.  Evaluation of Outcomes: Not Met  Physician Treatment Plan for Secondary Diagnosis: Active Problems:   Substance induced mood disorder ([place])  Long Term Goal(s):     Short Term Goals:       Medication Management: Evaluate patient's response, side effects, and tolerance of medication regimen.  Therapeutic Interventions: 1 to 1 sessions, Unit Group sessions and Medication administration.  Evaluation of Outcomes: Not Met   RN Treatment Plan for Primary Diagnosis: <principal problem not specified> Long Term Goal(s): Knowledge of disease and therapeutic regimen to maintain health will improve  Short Term Goals: Ability to participate in decision making will improve, Ability to verbalize feelings will improve, Ability to disclose and discuss suicidal ideas, Ability to identify and develop effective coping behaviors will improve and Compliance with prescribed medications will improve  Medication Management: RN will administer medications as ordered by provider, will assess and evaluate patient's response and provide education to patient for prescribed medication. RN will report any adverse and/or side effects to prescribing provider.  Therapeutic Interventions: 1 on 1 counseling sessions,  Psychoeducation, Medication administration, Evaluate responses to treatment, Monitor vital signs and CBGs as ordered, Perform/monitor CIWA, COWS, AIMS and Fall Risk screenings as ordered, Perform wound care treatments as ordered.  Evaluation of Outcomes: Not Met   LCSW Treatment Plan for Primary  Diagnosis: <principal problem not specified> Long Term Goal(s): Safe transition to appropriate next level of care at discharge, Engage patient in therapeutic group addressing interpersonal concerns.  Short Term Goals: Engage patient in aftercare planning with referrals and resources and Increase skills for wellness and recovery  Therapeutic Interventions: Assess for all discharge needs, 1 to 1 time with Social worker, Explore available resources and support systems, Assess for adequacy in community support network, Educate family and significant other(s) on suicide prevention, Complete Psychosocial Assessment, Interpersonal group therapy.  Evaluation of Outcomes: Not Met   Progress in Treatment: Attending groups: No. Participating in groups: No. Taking medication as prescribed: No.; has not been given medications yet Due to new admission Toleration medication: No. has not been given medications yet due to new admission Family/Significant other contact made: No, will contact:  will contact if given consent to contact Patient understands diagnosis: Yes. Discussing patient identified problems/goals with staff: Yes. Medical problems stabilized or resolved: Yes. Denies suicidal/homicidal ideation: Yes. Issues/concerns per patient self-inventory: No. Other:   New problem(s) identified: No, Describe:  None  New Short Term/Long Term Goal(s):  Patient Goals:  "I want to stabilize on my medications"  Discharge Plan or Barriers:   Reason for Continuation of Hospitalization: Depression Medication stabilization  Estimated Length of Stay: 3-5 days  Attendees: Patient: 02/12/2019   Physician: Dr. Neita Garnet, MD 02/12/2019  Nursing: Elberta Fortis, RN 02/12/2019   RN Care Manager: 02/12/2019  Social Worker: Ardelle Anton, LCSW 02/12/2019   Recreational Therapist:  02/12/2019  Other:  02/12/2019   Other:  02/12/2019   Other: 02/12/2019     Scribe for Treatment Team: Trecia Rogers,  LCSW 02/12/2019 9:35 AM

## 2019-02-12 NOTE — Progress Notes (Signed)
Recreation Therapy Notes  Date:  5.13.20 Time: 0930 Location: [address] Dayroom  Group Topic: Stress Management  Goal Area(s) Addresses:  Patient will identify positive stress management techniques. Patient will identify benefits of using stress management post d/c.   Intervention:  Stress Management  Activity :  Progressive Muscle Relaxation.  LRT introduced the stress management technique of PMR.  Patients were to listen and follow along with the instructions given by LRT.    Education:  Stress Management, Discharge Planning.   Education Outcome: Acknowledges Education  Clinical Observations/Feedback:  Pt did not attend group.      Victorino Sparrow, LRT/CTRS         Victorino Sparrow A 02/12/2019 10:43 AM
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
## 2019-02-12 NOTE — Progress Notes (Signed)
Pt has been accepted at [facility] and can arrive now.  Room: 305-1 Accepting: Patriciaann Clan, PA Attending: Dr. Parke Poisson, MD Call to Report: [phone]  This information was provided to pt's nurse, Jenel Lucks, [place], at [phone].

## 2019-02-13 LAB — HEMOGLOBIN A1C
Hgb A1c MFr Bld: 5.6 % (ref 4.8–5.6)
Mean Plasma Glucose: 114.02 mg/dL

## 2019-02-13 MED ORDER — TRAZODONE HCL 50 MG PO TABS
50.0000 mg | ORAL_TABLET | Freq: Every evening | ORAL | Status: DC | PRN
  Administered 2019-02-13: 50 mg via ORAL
  Filled 2019-02-13: qty 1

## 2019-02-13 MED ORDER — NICOTINE POLACRILEX 2 MG MT GUM: 2.0000 mg | CHEWING_GUM | OROMUCOSAL | Status: DC | PRN

## 2019-02-13 MED ORDER — ARIPIPRAZOLE 5 MG PO TABS
5.0000 mg | ORAL_TABLET | Freq: Every day | ORAL | Status: DC
  Administered 2019-02-13 – 2019-02-14 (×2): 5 mg via ORAL
  Filled 2019-02-13 (×4): qty 1

## 2019-02-13 MED ORDER — NICOTINE 21 MG/24HR TD PT24
21.0000 mg | MEDICATED_PATCH | Freq: Every day | TRANSDERMAL | Status: DC
  Administered 2019-02-14: 21 mg via TRANSDERMAL
  Filled 2019-02-13 (×3): qty 1
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
## 2019-02-13 NOTE — Plan of Care (Signed)
  Problem: Activity: Goal: Sleeping patterns will improve Outcome: Progressing Note:  Slept 6.5 hours last night.

## 2019-02-13 NOTE — Progress Notes (Signed)
Patient ID: Ashley Stevens, female   DOB: 08-Jul-1976, 43 y.o.   MRN: [id number]  [place] NOVEL CORONAVIRUS (COVID-19) DAILY CHECK-OFF SYMPTOMS - answer yes or no to each - every day NO YES  Have you had a fever in the past 24 hours?  . Fever (Temp > 37.80C / 100F) X   Have you had any of these symptoms in the past 24 hours? . New Cough .  Sore Throat  .  Shortness of Breath .  Difficulty Breathing .  Unexplained Body Aches   X   Have you had any one of these symptoms in the past 24 hours not related to allergies?   . Runny Nose .  Nasal Congestion .  Sneezing   X   If you have had runny nose, nasal congestion, sneezing in the past 24 hours, has it worsened?  X   EXPOSURES - check yes or no X   Have you traveled outside the state in the past 14 days?  X   Have you been in contact with someone with a confirmed diagnosis of COVID-19 or PUI in the past 14 days without wearing appropriate PPE?  X   Have you been living in the same home as a person with confirmed diagnosis of COVID-19 or a PUI (household contact)?    X   Have you been diagnosed with COVID-19?    X              What to do next: Answered NO to all: Answered YES to anything:   Proceed with unit schedule Follow the [facility] Inpatient Flowsheet.

## 2019-02-13 NOTE — Progress Notes (Signed)
D: Pt was in her room upon initial approach.  Pt presents with anxious affect and mood.  She complains of "racing thoughts."  She reports her day has "been pretty good."  Pt denies SI/HI, denies hallucinations, reports back pain of 8/10.  Pt has been isolative to her room for the majority of the evening.   A: Met with pt 1:1.  Actively listened to pt and offered support and encouragement.  PRN medication administered for anxiety, pain, and sleep per request.  Q15 minute safety checks in place.  R: Pt is safe on the unit.  Pt is compliant with medications.  Pt verbally contracts for safety.  Will continue to monitor and assess.   
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
## 2019-02-13 NOTE — Progress Notes (Addendum)
[facility] MD Progress Note  02/13/2019 2:25 PM Ashley Stevens  MRN:  [id number]   Subjective: Patient reports that she is feeling better today, but she does state that she feels a little "manicy."  Patient denies any suicidal homicidal ideations and denies any hallucinations.  Patient continues to state that she used cocaine only once.  She states that she has been off her Seroquel and Prozac for close to a month now.  She states that she stopped taking the Seroquel because she kept cutting it down and it was too sedating for her, but then later in the conversation the patient states that she stopped taking it because she ran out and never got a refill.  Patient states that she felt that the Prozac could probably help her but she never stayed on it long enough to see any good results at this time she plans to continue it.  Patient states that she slept well last night with the Seroquel but still feels that it is going to be too sedating when she starts going back to work.  There is concern that she will continue the medication per the patient's concern.  Patient states that her appetite has been well.  She denies any depression at the moment.  Objective: Patient's chart and findings reviewed and discussed with treatment team.  Patient presents in her room and she is very talkative, speech is pressured.  Patient is tangential and ruminates on various subjects.  Patient appears anxious, however, patient did report feeling somewhat manic and that may be why she is moving around so much while she is talking.  Due to the patient's concerns of continuing Seroquel and being too sedated discussed with her other medication possibilities that are not as sedating.  Discussed risperidone and patient was not interested in switching to another sedating medication, so we discussed Abilify and the patient is willing to try Abilify at a very low dose.  She agreed to start Abilify 5 mg p.o. daily and continue the  Prozac.  Principal Problem: MDD (major depressive disorder), recurrent severe, without psychosis ([place]) Diagnosis: Principal Problem:   MDD (major depressive disorder), recurrent severe, without psychosis ([place]) Active Problems:   Substance induced mood disorder ([place])  Total Time spent with patient: 30 minutes  Past Psychiatric History: See H&P  Past Medical History:  Past Medical History:  Diagnosis Date  . Anxiety   . Depression   . Shingles   . Suicidal ideations     Past Surgical History:  Procedure Laterality Date  . c-section     Family History:  Family History  Problem Relation Age of Onset  . Heart failure Mother   . Kidney failure Brother   . Diabetes Other   . Hypertension Other    Family Psychiatric  History: See H&P Social History:  Social History   Substance and Sexual Activity  Alcohol Use No     Social History   Substance and Sexual Activity  Drug Use Yes  . Types: Marijuana, Cocaine   Comment: Pt denies    Social History   Socioeconomic History  . Marital status: Legally Separated    Spouse name: Not on file  . Number of children: Not on file  . Years of education: Not on file  . Highest education level: Not on file  Occupational History  . Not on file  Social Needs  . Financial resource strain: Not on file  . Food insecurity:    Worry: Not on file  Inability: Not on file  . Transportation needs:    Medical: Not on file    Non-medical: Not on file  Tobacco Use  . Smoking status: Current Every Day Smoker    Packs/day: 1.00    Types: Cigarettes  . Smokeless tobacco: Never Used  Substance and Sexual Activity  . Alcohol use: No  . Drug use: Yes    Types: Marijuana, Cocaine    Comment: Pt denies  . Sexual activity: Yes    Birth control/protection: Condom  Lifestyle  . Physical activity:    Days per week: Not on file    Minutes per session: Not on file  . Stress: Not on file  Relationships  . Social connections:    Talks on  phone: Not on file    Gets together: Not on file    Attends religious service: Not on file    Active member of club or organization: Not on file    Attends meetings of clubs or organizations: Not on file    Relationship status: Not on file  Other Topics Concern  . Not on file  Social History Narrative  . Not on file   Additional Social History:                         Sleep: Good  Appetite:  Good  Current Medications: Current Facility-Administered Medications  Medication Dose Route Frequency Provider Last Rate Last Dose  . alum & mag hydroxide-simeth (MAALOX/MYLANTA) 200-200-20 MG/5ML suspension 30 mL  30 mL Oral Q4H PRN Patriciaann Clan E, PA-C      . ARIPiprazole (ABILIFY) tablet 5 mg  5 mg Oral Daily Money, Lowry Ram, FNP      . FLUoxetine (PROZAC) capsule 10 mg  10 mg Oral Daily , Myer Peer, MD   10 mg at 02/13/19 0757  . hydrOXYzine (ATARAX/VISTARIL) tablet 25 mg  25 mg Oral Q6H PRN Laverle Hobby, PA-C   25 mg at 02/13/19 0758  . ibuprofen (ADVIL) tablet 600 mg  600 mg Oral Q6H PRN Laverle Hobby, PA-C   600 mg at 02/13/19 6378  . magnesium hydroxide (MILK OF MAGNESIA) suspension 30 mL  30 mL Oral Daily PRN Patriciaann Clan E, PA-C      . nicotine (NICODERM CQ - dosed in mg/24 hours) patch 21 mg  21 mg Transdermal Daily , Myer Peer, MD      . traZODone (DESYREL) tablet 50 mg  50 mg Oral QHS PRN,MR X 1 Money, Lowry Ram, FNP        Lab Results:  Results for orders placed or performed during the hospital encounter of 02/12/19 (from the past 48 hour(s))  Hemoglobin A1c     Status: None   Collection Time: 02/13/19  6:46 AM  Result Value Ref Range   Hgb A1c MFr Bld 5.6 4.8 - 5.6 %    Comment: (NOTE) Pre diabetes:          5.7%-6.4% Diabetes:              >6.4% Glycemic control for   <7.0% adults with diabetes    Mean Plasma Glucose 114.02 mg/dL    Comment: Performed at [facility], [place] [address], [place], [place] [address]    Blood Alcohol  level:  Lab Results  Component Value Date   [facility] <10 11/10/2017   ETH <10 27/74/1287    Metabolic Disorder Labs: Lab Results  Component Value Date  HGBA1C 5.6 02/13/2019   MPG 114.02 02/13/2019   MPG 122.63 11/10/2017   No results found for: PROLACTIN Lab Results  Component Value Date   CHOL 156 12/28/2018   TRIG 69 12/28/2018   HDL 43 12/28/2018   CHOLHDL 3.6 12/28/2018   VLDL 14 12/28/2018   LDLCALC 99 12/28/2018   LDLCALC 124 (H) 11/10/2017    Physical Findings: AIMS: Facial and Oral Movements Muscles of Facial Expression: None, normal Lips and Perioral Area: None, normal Jaw: None, normal Tongue: None, normal,Extremity Movements Upper (arms, wrists, hands, fingers): None, normal Lower (legs, knees, ankles, toes): None, normal, Trunk Movements Neck, shoulders, hips: None, normal, Overall Severity Severity of abnormal movements (highest score from questions above): None, normal Incapacitation due to abnormal movements: None, normal Patient's awareness of abnormal movements (rate only patient's report): No Awareness, Dental Status Current problems with teeth and/or dentures?: No Does patient usually wear dentures?: No  CIWA:    COWS:     Musculoskeletal: Strength & Muscle Tone: within normal limits Gait & Station: normal Patient leans: N/A  Psychiatric Specialty Exam: Physical Exam  Nursing note and vitals reviewed. Constitutional: She is oriented to person, place, and time. She appears well-developed and well-nourished.  Cardiovascular: Normal rate.  Respiratory: Effort normal.  Musculoskeletal: Normal range of motion.  Neurological: She is alert and oriented to person, place, and time.  Skin: Skin is warm.    Review of Systems  Constitutional: Negative.   HENT: Negative.   Eyes: Negative.   Respiratory: Negative.   Cardiovascular: Negative.   Gastrointestinal: Negative.   Genitourinary: Negative.   Musculoskeletal: Negative.   Skin: Negative.    Neurological: Negative.   Endo/Heme/Allergies: Negative.   Psychiatric/Behavioral: Positive for substance abuse.       Hypomanic    Blood pressure (!) 161/91, pulse 85, temperature 98.1 F (36.7 C), temperature source Oral, resp. rate 18, height 6' (1.829 m), weight 127 kg, SpO2 98 %.Body mass index is 37.97 kg/m.  General Appearance: Casual  Eye Contact:  Good  Speech:  Pressured  Volume:  Increased  Mood:  Euthymic  Affect:  Congruent  Thought Process:  Linear and Descriptions of Associations: Tangential  Orientation:  Full (Time, Place, and Person)  Thought Content:  Rumination and Tangential  Suicidal Thoughts:  No  Homicidal Thoughts:  No  Memory:  Immediate;   Good Recent;   Good Remote;   Good  Judgement:  Fair  Insight:  Fair  Psychomotor Activity:  Normal  Concentration:  Concentration: Good and Attention Span: Fair  Recall:  Good  Fund of Knowledge:  Good  Language:  Good  Akathisia:  No  Handed:  Right  AIMS (if indicated):     Assets:  Communication Skills Desire for Improvement Financial Resources/Insurance Housing Physical Health Social Support Transportation  ADL's:  Intact  Cognition:  WNL  Sleep:  Number of Hours: 6.5     Treatment Plan Summary: Daily contact with patient to assess and evaluate symptoms and progress in treatment, Medication management and Plan is to:  [place], FNP 02/13/2019, 2:25 PM   Attest to NP Progress Note

## 2019-02-14 MED ORDER — METRONIDAZOLE 500 MG PO TABS
500.0000 mg | ORAL_TABLET | Freq: Two times a day (BID) | ORAL | Status: DC
  Administered 2019-02-14 – 2019-02-17 (×6): 500 mg via ORAL
  Filled 2019-02-14 (×4): qty 1
  Filled 2019-02-14: qty 2
  Filled 2019-02-14 (×4): qty 1

## 2019-02-14 MED ORDER — QUETIAPINE FUMARATE 25 MG PO TABS
25.0000 mg | ORAL_TABLET | Freq: Every day | ORAL | Status: DC
  Administered 2019-02-14 – 2019-02-16 (×3): 25 mg via ORAL
  Filled 2019-02-14 (×5): qty 1

## 2019-02-14 MED ORDER — NICOTINE 21 MG/24HR TD PT24
21.0000 mg | MEDICATED_PATCH | Freq: Every day | TRANSDERMAL | Status: DC
  Filled 2019-02-14 (×4): qty 1

## 2019-02-14 MED ORDER — LORAZEPAM 1 MG PO TABS
1.0000 mg | ORAL_TABLET | Freq: Three times a day (TID) | ORAL | Status: DC | PRN
  Administered 2019-02-15 – 2019-02-16 (×2): 1 mg via ORAL
  Filled 2019-02-14 (×2): qty 1

## 2019-02-14 MED ORDER — ARIPIPRAZOLE 15 MG PO TABS
15.0000 mg | ORAL_TABLET | Freq: Every day | ORAL | Status: DC
  Administered 2019-02-15 – 2019-02-17 (×3): 15 mg via ORAL
  Filled 2019-02-14 (×5): qty 1

## 2019-02-14 MED ORDER — NYSTATIN 100000 UNIT/GM EX POWD
Freq: Two times a day (BID) | CUTANEOUS | Status: DC
  Administered 2019-02-14 – 2019-02-15 (×3): via TOPICAL
  Administered 2019-02-16: 1 g via TOPICAL
  Filled 2019-02-14 (×2): qty 15

## 2019-02-14 NOTE — Plan of Care (Signed)
  Problem: Education: Goal: Emotional status will improve Outcome: Progressing   

## 2019-02-14 NOTE — Progress Notes (Signed)
D pt is observed sitting in the dayroom. SHe is loud, her verbage can be inapproraite  And she curses frequently and Probation officer encourages her to be aware of boundaries and she is in acceptance of this. She wears hospital-issue patient scrubs, she is socializing loudly

## 2019-02-14 NOTE — Progress Notes (Addendum)
[facility] MD Progress Note  02/14/2019 12:29 PM Ashley Stevens  MRN:  [id number]   Subjective:  Patient reports she is feeling better. Denies medication side effects. Denies suicidal ideations.   Objective:  I have discussed case with treatment team and have met with patient. 43 year old divorced female. Reports history of Bipolar Disorder, PTSD diagnoses , Cocaine Abuse.Presented to the hospital for depression, passive SI, neuro-vegetative symptoms, sadness related partly to mother's day ( mother passed away died in 2014-12-23), not seeing her children recently, being off her medications, relapse on cocaine.  Patient states " I came in depressed but since then I have been kind of manic ". Reports a subjective feeling of " racing thoughts " although " it is better today". Presents pleasant , cooperative , affect tends to be expansive, but not overtly irritable .  Denies suicidal ideations Currently denies medication side effects- of note, was initially started on Prozac and Seroquel, but reported that Seroquel was too sedating and was switched to Abilify, which she states she is tolerating well thus far . No disruptive behaviors on unit, limited group participation, noted to be on hall phone often. HgbA1C 5.6   Principal Problem: MDD (major depressive disorder), recurrent severe, without psychosis ([place]) Diagnosis: Principal Problem:   MDD (major depressive disorder), recurrent severe, without psychosis ([place]) Active Problems:   Substance induced mood disorder ([place])  Total Time spent with patient: 20 minutes  Past Psychiatric History: See H&P  Past Medical History:  Past Medical History:  Diagnosis Date  . Anxiety   . Depression   . Shingles   . Suicidal ideations     Past Surgical History:  Procedure Laterality Date  . c-section     Family History:  Family History  Problem Relation Age of Onset  . Heart failure Mother   . Kidney failure Brother   . Diabetes Other   . Hypertension  Other    Family Psychiatric  History: See H&P Social History:  Social History   Substance and Sexual Activity  Alcohol Use No     Social History   Substance and Sexual Activity  Drug Use Yes  . Types: Marijuana, Cocaine   Comment: Pt denies    Social History   Socioeconomic History  . Marital status: Legally Separated    Spouse name: Not on file  . Number of children: Not on file  . Years of education: Not on file  . Highest education level: Not on file  Occupational History  . Not on file  Social Needs  . Financial resource strain: Not on file  . Food insecurity:    Worry: Not on file    Inability: Not on file  . Transportation needs:    Medical: Not on file    Non-medical: Not on file  Tobacco Use  . Smoking status: Current Every Day Smoker    Packs/day: 1.00    Types: Cigarettes  . Smokeless tobacco: Never Used  Substance and Sexual Activity  . Alcohol use: No  . Drug use: Yes    Types: Marijuana, Cocaine    Comment: Pt denies  . Sexual activity: Yes    Birth control/protection: Condom  Lifestyle  . Physical activity:    Days per week: Not on file    Minutes per session: Not on file  . Stress: Not on file  Relationships  . Social connections:    Talks on phone: Not on file    Gets together: Not on file  Attends religious service: Not on file    Active member of club or organization: Not on file    Attends meetings of clubs or organizations: Not on file    Relationship status: Not on file  Other Topics Concern  . Not on file  Social History Narrative  . Not on file   Additional Social History:   Sleep: Fair  Appetite:  Good  Current Medications: Current Facility-Administered Medications  Medication Dose Route Frequency Provider Last Rate Last Dose  . alum & mag hydroxide-simeth (MAALOX/MYLANTA) 200-200-20 MG/5ML suspension 30 mL  30 mL Oral Q4H PRN Patriciaann Clan E, PA-C      . ARIPiprazole (ABILIFY) tablet 5 mg  5 mg Oral Daily Money,  Lowry Ram, FNP   5 mg at 02/14/19 0810  . FLUoxetine (PROZAC) capsule 10 mg  10 mg Oral Daily Cobos, Myer Peer, MD   10 mg at 02/14/19 0810  . hydrOXYzine (ATARAX/VISTARIL) tablet 25 mg  25 mg Oral Q6H PRN Laverle Hobby, PA-C   25 mg at 02/14/19 0813  . ibuprofen (ADVIL) tablet 600 mg  600 mg Oral Q6H PRN Laverle Hobby, PA-C   600 mg at 02/14/19 0813  . magnesium hydroxide (MILK OF MAGNESIA) suspension 30 mL  30 mL Oral Daily PRN Patriciaann Clan E, PA-C      . nicotine (NICODERM CQ - dosed in mg/24 hours) patch 21 mg  21 mg Transdermal Daily Cobos, Myer Peer, MD   21 mg at 02/14/19 0810  . traZODone (DESYREL) tablet 50 mg  50 mg Oral QHS PRN,MR X 1 Money, Lowry Ram, FNP   50 mg at 02/13/19 2115    Lab Results:  Results for orders placed or performed during the hospital encounter of 02/12/19 (from the past 48 hour(s))  Hemoglobin A1c     Status: None   Collection Time: 02/13/19  6:46 AM  Result Value Ref Range   Hgb A1c MFr Bld 5.6 4.8 - 5.6 %    Comment: (NOTE) Pre diabetes:          5.7%-6.4% Diabetes:              >6.4% Glycemic control for   <7.0% adults with diabetes    Mean Plasma Glucose 114.02 mg/dL    Comment: Performed at [facility] [address] [place], [place] [address]    Blood Alcohol level:  Lab Results  Component Value Date   ETH <10 11/10/2017   ETH <10 82/99/3716    Metabolic Disorder Labs: Lab Results  Component Value Date   HGBA1C 5.6 02/13/2019   MPG 114.02 02/13/2019   MPG 122.63 11/10/2017   No results found for: PROLACTIN Lab Results  Component Value Date   CHOL 156 12/28/2018   TRIG 69 12/28/2018   HDL 43 12/28/2018   CHOLHDL 3.6 12/28/2018   VLDL 14 12/28/2018   LDLCALC 99 12/28/2018   LDLCALC 124 (H) 11/10/2017    Physical Findings: AIMS: Facial and Oral Movements Muscles of Facial Expression: None, normal Lips and Perioral Area: None, normal Jaw: None, normal Tongue: None, normal,Extremity Movements Upper (arms,  wrists, hands, fingers): None, normal Lower (legs, knees, ankles, toes): None, normal, Trunk Movements Neck, shoulders, hips: None, normal, Overall Severity Severity of abnormal movements (highest score from questions above): None, normal Incapacitation due to abnormal movements: None, normal Patient's awareness of abnormal movements (rate only patient's report): No Awareness, Dental Status Current problems with teeth and/or dentures?: No Does patient usually  wear dentures?: No  CIWA:    COWS:     Musculoskeletal: Strength & Muscle Tone: within normal limits Gait & Station: normal Patient leans: N/A  Psychiatric Specialty Exam: Physical Exam  Nursing note and vitals reviewed. Constitutional: She is oriented to person, place, and time. She appears well-developed and well-nourished.  Cardiovascular: Normal rate.  Respiratory: Effort normal.  Musculoskeletal: Normal range of motion.  Neurological: She is alert and oriented to person, place, and time.  Skin: Skin is warm.    Review of Systems  Constitutional: Negative.   HENT: Negative.   Eyes: Negative.   Respiratory: Negative.   Cardiovascular: Negative.   Gastrointestinal: Negative.   Genitourinary: Negative.   Musculoskeletal: Negative.   Skin: Negative.   Neurological: Negative.   Endo/Heme/Allergies: Negative.   Psychiatric/Behavioral: Positive for substance abuse.       Hypomanic   Denies headache, no chest pain, no shortness of breath, no coughing, no fever or chills, describes lower back pain.  Blood pressure (!) 124/92, pulse 92, temperature 98 F (36.7 C), temperature source Oral, resp. rate 18, height 6' (1.829 m), weight 127 kg, SpO2 98 %.Body mass index is 37.97 kg/m.  General Appearance: Well Groomed  Eye Contact:  Good  Speech:  pressured at times   Volume:  Normal  Mood:  reports mood is improved, describes as 6-7 /10 with 10 being best   Affect:  vaguely expansive, jovial, not irritable  Thought  Process:  Linear and Descriptions of Associations: Intact  Orientation:  Other:  fully alert and attentive   Thought Content:  denies hallucinations, no delusions, not internally preoccupied   Suicidal Thoughts:  No denies suicidal or self injurious ideations, denies homicidal or violent ideations   Homicidal Thoughts:  No  Memory:  recent and remote grossly intact   Judgement:  Fair  Insight:  Fair  Psychomotor Activity:  Normal- no psychomotor agitation  Concentration:  Concentration: Good and Attention Span: Good  Recall:  Good  Fund of Knowledge:  Good  Language:  Good  Akathisia:  No  Handed:  Right  AIMS (if indicated):     Assets:  Communication Skills Desire for Improvement Financial Resources/Insurance Housing Physical Health Social Support Transportation  ADL's:  Intact  Cognition:  WNL  Sleep:  Number of Hours: 3.75   Assessment - 43 year old divorced female. Reports history of Bipolar Disorder, PTSD , Cocaine Abuse.Presented to the hospital for depression, passive SI, neuro-vegetative symptoms, sadness related partly to mother's day ( mother passed away died in 12-15-2014), not seeing her children recently, being off her medications, relapse on cocaine.  Patient presented for depression, depressive symptoms, but currently reports and presents with hypomanic symptoms ( racing thoughts, expansive affect, poor sleep . There are no psychotic symptoms) . She is currently on Abilify, Prozac. Abilify trial is new and replaced Seroquel, as patient reported Seroquel was too sedating . She is tolerating medications well thus far , denies suicidal ideations. Based on above will increase Abilify to 10 mgrs QDAY , discontinue Fluoxetine for now.  Patient also reports recent diagnosis of bacterial vaginosis, for which she had not yet started treatment. Describes discharge and some discomfort . No dysuria. States that she has tolerated Flagyl well in the past .   Treatment Plan  Summary: Daily contact with patient to assess and evaluate symptoms and progress in treatment, Medication management and Plan is to:  Treatment plan reviewed as below today 5/15 Encourage efforts to work on sobriety and  relapse prevention Encourage group and milieu participation Increase Abilify to 15 mgrs QDAY for mood disorder  Starts Metronidazole 500 mgrs Q 12 hours - see above  Discontinue Prozac , discontinue Trazodoone  Ativan 1 mgr Q 8 hours PRN for anxiety as needed  Treatment team working on disposition planning options  Jenne Campus, MD 02/14/2019, 12:29 PMPatient ID: Ashley Stevens, female   DOB: 1976-09-12, 43 y.o.   MRN: [id number]

## 2019-02-14 NOTE — Progress Notes (Signed)
Recreation Therapy Notes  Date:  5.15.20 Time: 0930 Location: [address] Dayroom  Group Topic: Stress Management  Goal Area(s) Addresses:  Patient will identify positive stress management techniques. Patient will identify benefits of using stress management post d/c.  Intervention: Stress Management  Activity :  Guided Imagery.  LRT introduced the stress management technique of guided imagery.  LRT read a script that lead patients on a walk along the beach.  Patients were to listen and follow along to engage in activity.  Education:  Stress Management, Discharge Planning.   Education Outcome: Acknowledges Education  Clinical Observations/Feedback: Pt did not attend group.    Victorino Sparrow, LRT/CTRS        Victorino Sparrow A 02/14/2019 11:54 AM

## 2019-02-15 MED ORDER — GABAPENTIN 100 MG PO CAPS
100.0000 mg | ORAL_CAPSULE | Freq: Three times a day (TID) | ORAL | Status: DC
  Administered 2019-02-15 – 2019-02-17 (×7): 100 mg via ORAL
  Filled 2019-02-15 (×11): qty 1
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
## 2019-02-15 NOTE — Progress Notes (Signed)
D: Pt SI-contracts denies HI/AVH. Pt is pleasant and cooperative. Pt stated she was doing ok this evening. Pt concerned about sleeping, but pt has been appropriate on the unit this evening.  A: Pt was offered support and encouragement. Pt was given scheduled medications. Pt was encourage to attend groups. Q 15 minute checks were done for safety.  R:Pt attends groups and interacts well with peers and staff. Pt is taking medication. Pt has no complaints.Pt receptive to treatment and safety maintained on unit.

## 2019-02-15 NOTE — Progress Notes (Signed)
D. Pt was friendly upon initial approach- observed in the dayroom this morning interacting appropriately with peers- returned to bed after am meds.  Pt currently denies SI/HI and AVH  A. Labs and vitals monitored. Pt compliant with medications. Pt supported emotionally and encouraged to express concerns and ask questions.   R. Pt remains safe with 15 minute checks. Will continue POC.

## 2019-02-15 NOTE — Progress Notes (Signed)
[facility] Group Notes:  (Nursing/MHT/Case Management/Adjunct)  Date:  02/15/2019  Time:  2030 Type of Therapy:  wrap up group  Participation Level:  Active  Participation Quality:  Appropriate, Attentive, Monopolizing, Redirectable, Sharing and Supportive  Affect:  Appropriate  Cognitive:  Appropriate  Insight:  Improving  Engagement in Group:  Engaged  Modes of Intervention:  Clarification, Education and Support  Summary of Progress/Problems: Pt enjoyed sweating in the sun outside today. Pt shared that she is excited to connect with her ACT team and finally feels like she has a support system.  Pt is grateful for her 3 kids.   Shellia Cleverly 02/15/2019, 9:21 PM

## 2019-02-15 NOTE — Progress Notes (Signed)
[place] NOVEL CORONAVIRUS (COVID-19) DAILY CHECK-OFF SYMPTOMS - answer yes or no to each - every day NO YES  Have you had a fever in the past 24 hours?  . Fever (Temp > 37.80C / 100F) X   Have you had any of these symptoms in the past 24 hours? . New Cough .  Sore Throat  .  Shortness of Breath .  Difficulty Breathing .  Unexplained Body Aches   X   Have you had any one of these symptoms in the past 24 hours not related to allergies?   . Runny Nose .  Nasal Congestion .  Sneezing   X   If you have had runny nose, nasal congestion, sneezing in the past 24 hours, has it worsened?  X   EXPOSURES - check yes or no X   Have you traveled outside the state in the past 14 days?  X   Have you been in contact with someone with a confirmed diagnosis of COVID-19 or PUI in the past 14 days without wearing appropriate PPE?  X   Have you been living in the same home as a person with confirmed diagnosis of COVID-19 or a PUI (household contact)?    X   Have you been diagnosed with COVID-19?    X              What to do next: Answered NO to all: Answered YES to anything:   Proceed with unit schedule Follow the [facility] Inpatient Flowsheet.   

## 2019-02-15 NOTE — Progress Notes (Signed)
D. Pt pleasant on approach, no complaints voiced.  Pt was positive for evening wrap up group, observed engaged in appropriate interaction with peers on the unit.  Pt denies SI/HI/AVH at this time.  A.  Support and encouragement offered, medication given as ordered  R. Pt remains safe on the unit, will continue to monitor.

## 2019-02-15 NOTE — [facility] Group Notes (Signed)
[facility] LCSW Group Therapy Note  02/15/2019  10:00-11:00AM  Type of Therapy and Topic:  Group Therapy - Accepting We Are All Damaged People  Participation Level:  Did Not Attend   Description of Group:  Patients in this group were asked to share whether they feel that they are "damaged" and explain their responses.  A song entitled "Damaged People" was then played, followed by a discussion of the relevance/relatedness of this song to each patient.   The conclusion of the group was that our goal as humans does not need to be perfection, but rather growth.  Insights among group members were shared, including that it is easy to point the fingers at others as being damaged, but actually we need to realize that we also are flawed humans with problems to overcome.  The group concluded with an emphasis on how this is ultimately a message of hope that we face struggles like every other person in the world, and that we are not alone.  Therapeutic Goals: 1)  introduce the concept of pain and hardship being universal  2)  connect emotionally to a musical message and to other group members  3)  identify the patient's current beliefs about their own broken methods of resolving their life problems to date, specifically related to this hospitalization  4)  allow time and space for patients to vent their pain and receive support from other patients  5)  elicit hope that arises from realizing we are not alone in our human struggles   Summary of Patient Progress:  N/A  Therapeutic Modalities:   Motivational Interviewing Activity  Ashley Stevens  02/15/2019 8:36 AM

## 2019-02-15 NOTE — [facility] Group Notes (Signed)
[facility] Group Notes:  (Nursing/MHT/Case Management/Adjunct)  Date:  02/15/2019  Time:130 PM Type of Therapy:  Nurse Education  Participation Level:  Active  Participation Quality:  Appropriate and Attentive  Affect:  Appropriate  Cognitive:  Appropriate  Insight:  Appropriate  Engagement in Group:  Engaged  Modes of Intervention:  Education  Summary of Progress/Problems: Nurse led Life Skills Group  Waymond Cera 02/15/2019, 1:55 PM

## 2019-02-16 MED ORDER — LORATADINE 10 MG PO TABS
10.0000 mg | ORAL_TABLET | Freq: Every day | ORAL | Status: DC
  Administered 2019-02-16 – 2019-02-17 (×2): 10 mg via ORAL
  Filled 2019-02-16 (×4): qty 1

## 2019-02-16 NOTE — Progress Notes (Signed)
D. Pt has been calm and cooperative- pleasant mood- friendly during interactions. Pt currently denies SI/HI and AVH  A. Labs and vitals monitored. Pt compliant with medications. Pt supported emotionally and encouraged to express concerns and ask questions.   R. Pt remains safe with 15 minute checks. Will continue POC.

## 2019-02-16 NOTE — [facility] Group Notes (Signed)
[facility] LCSW Group Therapy Note  02/16/2019  10:00-11:00AM  Type of Therapy and Topic:  Group Therapy:  Adding Supports Including Yourself  Participation Level:  Did not attend   Description of Group:  Patients in this group were introduced to the concept that additional supports including self-support are an essential part of recovery.  Patients listed what supports they believe they need to add to their lives to achieve their goals at discharge, and they listed such things as therapist, family, doctor, support groups, and service dog.  CSW described the  continuum of mental health/substance abuse services available and the group discussed the differences among these including support group, therapy group, 12-step group, Actuary, Transport planner, and such.  A song entitled "My Own Hero" was played and a group discussion ensued in which patients stated they could relate to the song and it inspired them to realize they have be willing to help themselves in order to succeed, because other people cannot achieve sobriety or stability for them.  A song was played called "I Am Enough" which led to a discussion about being willing to believe we are worth the effort of being a self-support.  Group members expressed appreciation for each other.  Therapeutic Goals: 1)  demonstrate the importance of being a key part of one's own support system 2)  discuss various available supports 3)  encourage patient to use music as part of their self-support and focus on goals 4)  elicit ideas from patients about supports that need to be added   Summary of Patient Progress:  N/A   Therapeutic Modalities:   Motivational Interviewing Activity  Maretta Los

## 2019-02-16 NOTE — Progress Notes (Signed)
D.  Pt pleasant on approach, no complaints voiced.  Pt was positive for evening wrap up group, observed engaged in appropriate interaction with peers on the unit.  Pt denies SI/HI/AVH at this time.  A.  Support and encouragement offered, medication given as ordered  R.  Pt remains safe on the unit, will continue to monitor.

## 2019-02-16 NOTE — Progress Notes (Addendum)
[facility] MD Progress Note  02/16/2019 1:53 PM Ashley Stevens  MRN:  [id number]   Subjective:  Patient reports that she feels good today. She denies any suicidal or homicidal ideations and denies any hallucinations. She denies any depression or anxiety. She reports that she slept good last night and her appetite is good. She feels a lot calmer and stable. She reports that she had some red areas come up on her leg and face yesterday and that she took a vistaril and it cleared it up. She is unsure of what caused it, but it started when she was outside for group and it was hot. It started improving before she took the vistaril after she came back inside.  Objective: Patient's chart and findings reviewed and discussed with treatment team.Patient presents in the day room and is interacting with peers and staff appropriately. She has been attending group and participating. She is pleasant, calm, and cooperative. There have been no complaint about the patient recently.   Principal Problem: MDD (major depressive disorder), recurrent severe, without psychosis ([place]) Diagnosis: Principal Problem:   MDD (major depressive disorder), recurrent severe, without psychosis ([place]) Active Problems:   Substance induced mood disorder ([place])  Total Time spent with patient: 30 minutes  Past Psychiatric History: See H&P  Past Medical History:  Past Medical History:  Diagnosis Date  . Anxiety   . Depression   . Shingles   . Suicidal ideations     Past Surgical History:  Procedure Laterality Date  . c-section     Family History:  Family History  Problem Relation Age of Onset  . Heart failure Mother   . Kidney failure Brother   . Diabetes Other   . Hypertension Other    Family Psychiatric  History: See H&P Social History:  Social History   Substance and Sexual Activity  Alcohol Use No     Social History   Substance and Sexual Activity  Drug Use Yes  . Types: Marijuana, Cocaine   Comment: Pt denies     Social History   Socioeconomic History  . Marital status: Legally Separated    Spouse name: Not on file  . Number of children: Not on file  . Years of education: Not on file  . Highest education level: Not on file  Occupational History  . Not on file  Social Needs  . Financial resource strain: Not on file  . Food insecurity:    Worry: Not on file    Inability: Not on file  . Transportation needs:    Medical: Not on file    Non-medical: Not on file  Tobacco Use  . Smoking status: Current Every Day Smoker    Packs/day: 1.00    Types: Cigarettes  . Smokeless tobacco: Never Used  Substance and Sexual Activity  . Alcohol use: No  . Drug use: Yes    Types: Marijuana, Cocaine    Comment: Pt denies  . Sexual activity: Yes    Birth control/protection: Condom  Lifestyle  . Physical activity:    Days per week: Not on file    Minutes per session: Not on file  . Stress: Not on file  Relationships  . Social connections:    Talks on phone: Not on file    Gets together: Not on file    Attends religious service: Not on file    Active member of club or organization: Not on file    Attends meetings of clubs or organizations: Not on  file    Relationship status: Not on file  Other Topics Concern  . Not on file  Social History Narrative  . Not on file   Additional Social History:                         Sleep: Good  Appetite:  Good  Current Medications: Current Facility-Administered Medications  Medication Dose Route Frequency Provider Last Rate Last Dose  . alum & mag hydroxide-simeth (MAALOX/MYLANTA) 200-200-20 MG/5ML suspension 30 mL  30 mL Oral Q4H PRN Patriciaann Clan E, PA-C      . ARIPiprazole (ABILIFY) tablet 15 mg  15 mg Oral Daily Unknown Schleyer, Myer Peer, MD   15 mg at 02/16/19 0818  . gabapentin (NEURONTIN) capsule 100 mg  100 mg Oral TID Money, Darnelle Maffucci B, FNP   100 mg at 02/16/19 1232  . hydrOXYzine (ATARAX/VISTARIL) tablet 25 mg  25 mg Oral Q6H PRN Laverle Hobby, PA-C   25 mg at 02/16/19 2426  . ibuprofen (ADVIL) tablet 600 mg  600 mg Oral Q6H PRN Laverle Hobby, PA-C   600 mg at 02/15/19 2118  . loratadine (CLARITIN) tablet 10 mg  10 mg Oral Daily Money, Lowry Ram, FNP   10 mg at 02/16/19 0817  . LORazepam (ATIVAN) tablet 1 mg  1 mg Oral Q8H PRN Ariya Bohannon, Myer Peer, MD   1 mg at 02/15/19 2118  . magnesium hydroxide (MILK OF MAGNESIA) suspension 30 mL  30 mL Oral Daily PRN Laverle Hobby, PA-C   30 mL at 02/16/19 1302  . metroNIDAZOLE (FLAGYL) tablet 500 mg  500 mg Oral Q12H Kingslee Mairena A, MD   500 mg at 02/16/19 0817  . nicotine (NICODERM CQ - dosed in mg/24 hours) patch 21 mg  21 mg Transdermal Daily Money, Lowry Ram, FNP      . nystatin (MYCOSTATIN/NYSTOP) topical powder   Topical BID Connye Burkitt, NP   Stopped at 02/16/19 [phone]  . QUEtiapine (SEROQUEL) tablet 25 mg  25 mg Oral QHS Money, Lowry Ram, FNP   25 mg at 02/15/19 2117    Lab Results: No results found for this or any previous visit (from the past 48 hour(s)).  Blood Alcohol level:  Lab Results  Component Value Date   ETH <10 11/10/2017   ETH <10 96/22/2979    Metabolic Disorder Labs: Lab Results  Component Value Date   HGBA1C 5.6 02/13/2019   MPG 114.02 02/13/2019   MPG 122.63 11/10/2017   No results found for: PROLACTIN Lab Results  Component Value Date   CHOL 156 12/28/2018   TRIG 69 12/28/2018   HDL 43 12/28/2018   CHOLHDL 3.6 12/28/2018   VLDL 14 12/28/2018   LDLCALC 99 12/28/2018   LDLCALC 124 (H) 11/10/2017    Physical Findings: AIMS: Facial and Oral Movements Muscles of Facial Expression: None, normal Lips and Perioral Area: None, normal Jaw: None, normal Tongue: None, normal,Extremity Movements Upper (arms, wrists, hands, fingers): None, normal Lower (legs, knees, ankles, toes): None, normal, Trunk Movements Neck, shoulders, hips: None, normal, Overall Severity Severity of abnormal movements (highest score from questions above): None,  normal Incapacitation due to abnormal movements: None, normal Patient's awareness of abnormal movements (rate only patient's report): No Awareness, Dental Status Current problems with teeth and/or dentures?: No Does patient usually wear dentures?: No  CIWA:    COWS:     Musculoskeletal: Strength & Muscle Tone: within normal limits Gait & Station: normal  Patient leans: N/A  Psychiatric Specialty Exam: Physical Exam  Nursing note and vitals reviewed. Constitutional: She is oriented to person, place, and time. She appears well-developed and well-nourished.  Cardiovascular: Normal rate.  Respiratory: Effort normal.  Musculoskeletal: Normal range of motion.  Neurological: She is alert and oriented to person, place, and time.  Skin: Skin is warm.    Review of Systems  Constitutional: Negative.   HENT: Negative.   Eyes: Negative.   Respiratory: Negative.   Cardiovascular: Negative.   Gastrointestinal: Negative.   Genitourinary: Negative.   Musculoskeletal: Negative.   Skin: Negative.   Neurological: Negative.   Endo/Heme/Allergies: Negative.   Psychiatric/Behavioral: Negative.     Blood pressure (!) 145/93, pulse (!) 101, temperature 98.1 F (36.7 C), temperature source Oral, resp. rate 20, height 6' (1.829 m), weight 127 kg, SpO2 98 %.Body mass index is 37.97 kg/m.  General Appearance: Casual  Eye Contact:  Good  Speech:  Clear and Coherent and Normal Rate  Volume:  Normal  Mood:  Euthymic  Affect:  Congruent  Thought Process:  Coherent and Descriptions of Associations: Intact  Orientation:  Full (Time, Place, and Person)  Thought Content:  WDL  Suicidal Thoughts:  No  Homicidal Thoughts:  No  Memory:  Immediate;   Good Recent;   Good Remote;   Good  Judgement:  Good  Insight:  Good  Psychomotor Activity:  Normal  Concentration:  Concentration: Good and Attention Span: Good  Recall:  Good  Fund of Knowledge:  Good  Language:  Good  Akathisia:  No  Handed:   Right  AIMS (if indicated):     Assets:  Communication Skills Desire for Improvement Financial Resources/Insurance Housing Physical Health Social Support Transportation  ADL's:  Intact  Cognition:  WNL  Sleep:  Number of Hours: 6.25   Problems Addressed MDD severe recurrent  Substance induced mood disorder  Treatment Plan Summary: Daily contact with patient to assess and evaluate symptoms and progress in treatment  Continue Abilify 15 mg PO Daily for mood stability Continue Neurontin 100 mg PO TID for anxiety and mood stability Continue Vistaril 25 mg PO Q6H PRN for anxiety Start Claritin 10 mg PO Daily for allergies Continue Seroquel 25 mg PO QHS  Encourage group therapy participation Continue Q15 minute safety checks CSW to assist with disposition  Potential discharge tomorrow  Lewis Shock, FNP 02/16/2019, 1:53 PM   Attest to NP Progress Note

## 2019-02-16 NOTE — Progress Notes (Signed)
[place] NOVEL CORONAVIRUS (COVID-19) DAILY CHECK-OFF SYMPTOMS - answer yes or no to each - every day NO YES  Have you had a fever in the past 24 hours?  . Fever (Temp > 37.80C / 100F) X   Have you had any of these symptoms in the past 24 hours? . New Cough .  Sore Throat  .  Shortness of Breath .  Difficulty Breathing .  Unexplained Body Aches   X   Have you had any one of these symptoms in the past 24 hours not related to allergies?   . Runny Nose .  Nasal Congestion .  Sneezing   X   If you have had runny nose, nasal congestion, sneezing in the past 24 hours, has it worsened?  X   EXPOSURES - check yes or no X   Have you traveled outside the state in the past 14 days?  X   Have you been in contact with someone with a confirmed diagnosis of COVID-19 or PUI in the past 14 days without wearing appropriate PPE?  X   Have you been living in the same home as a person with confirmed diagnosis of COVID-19 or a PUI (household contact)?    X   Have you been diagnosed with COVID-19?    X              What to do next: Answered NO to all: Answered YES to anything:   Proceed with unit schedule Follow the [facility] Inpatient Flowsheet.   

## 2019-02-17 MED ORDER — HYDROXYZINE HCL 25 MG PO TABS: 25.0000 mg | ORAL_TABLET | Freq: Four times a day (QID) | ORAL | 0 refills | Status: DC | PRN

## 2019-02-17 MED ORDER — GABAPENTIN 100 MG PO CAPS: 100.0000 mg | ORAL_CAPSULE | Freq: Three times a day (TID) | ORAL | 0 refills | Status: DC

## 2019-02-17 MED ORDER — METRONIDAZOLE 500 MG PO TABS: 500.0000 mg | ORAL_TABLET | Freq: Two times a day (BID) | ORAL | 0 refills | Status: DC

## 2019-02-17 MED ORDER — NICOTINE 21 MG/24HR TD PT24: 21.0000 mg | MEDICATED_PATCH | Freq: Every day | TRANSDERMAL | 0 refills | Status: DC

## 2019-02-17 MED ORDER — ARIPIPRAZOLE 15 MG PO TABS: 15.0000 mg | ORAL_TABLET | Freq: Every day | ORAL | 0 refills | Status: DC

## 2019-02-17 NOTE — Progress Notes (Signed)
  [facility] Adult Case Management Discharge Plan :  Will you be returning to the same living situation after discharge:  Yes,  at home At discharge, do you have transportation home?: Yes,  husband Do you have the ability to pay for your medications: Yes,  medicaid  Release of information consent forms completed and in the chart;  Patient's signature needed at discharge.  Patient to Follow up at: Follow-up Information    [facility]. Go on 02/18/2019.   Why:  Your ACTT services will continue after discarge. Meyer Cory on your ACTT team will follow up with you on Tuesday, 5/19 @ 10am, please expect a call from him.  Contact information: [address] [place] [address] [phone]           Next level of care provider has access to [place] and Suicide Prevention discussed: Yes,  with pt     Has patient been referred to the Quitline?: Yes, faxed on 02/17/2019  Patient has been referred for addiction treatment: Yes  Trecia Rogers, LCSW 02/17/2019, 10:33 AM

## 2019-02-17 NOTE — Plan of Care (Signed)
Problem: Education: Goal: Knowledge of [facility] Education information/materials will improve Outcome: Adequate for Discharge Goal: Emotional status will improve Outcome: Adequate for Discharge Goal: Mental status will improve Outcome: Adequate for Discharge Goal: Verbalization of understanding the information provided will improve Outcome: Adequate for Discharge   Problem: Coping: Goal: Ability to verbalize frustrations and anger appropriately will improve Outcome: Adequate for Discharge Goal: Ability to demonstrate self-control will improve Outcome: Adequate for Discharge   Problem: Education: Goal: Ability to state activities that reduce stress will improve Outcome: Adequate for Discharge   Problem: Coping: Goal: Ability to identify and develop effective coping behavior will improve Outcome: Adequate for Discharge   Problem: Self-Concept: Goal: Ability to identify factors that promote anxiety will improve Outcome: Adequate for Discharge Goal: Level of anxiety will decrease Outcome: Adequate for Discharge Goal: Ability to modify response to factors that promote anxiety will improve Outcome: Adequate for Discharge   Problem: Education: Goal: Ability to make informed decisions regarding treatment will improve Outcome: Adequate for Discharge   Problem: Health Behavior/Discharge Planning: Goal: Identification of resources available to assist in meeting health care needs will improve Outcome: Adequate for Discharge   Problem: Self-Concept: Goal: Ability to disclose and discuss suicidal ideas will improve Outcome: Adequate for Discharge   Problem: Education: Goal: Utilization of techniques to improve thought processes will improve Outcome: Adequate for Discharge Goal: Knowledge of the prescribed therapeutic regimen will improve Outcome: Adequate for Discharge   Problem: Activity: Goal: Interest or engagement in leisure activities will improve Outcome:  Adequate for Discharge Goal: Imbalance in normal sleep/wake cycle will improve Outcome: Adequate for Discharge   Problem: Coping: Goal: Coping ability will improve Outcome: Adequate for Discharge Goal: Will verbalize feelings Outcome: Adequate for Discharge   Problem: Health Behavior/Discharge Planning: Goal: Ability to make decisions will improve Outcome: Adequate for Discharge Goal: Compliance with therapeutic regimen will improve Outcome: Adequate for Discharge   Problem: Role Relationship: Goal: Will demonstrate positive changes in social behaviors and relationships Outcome: Adequate for Discharge   Problem: Safety: Goal: Ability to disclose and discuss suicidal ideas will improve Outcome: Adequate for Discharge Goal: Ability to identify and utilize support systems that promote safety will improve Outcome: Adequate for Discharge   Problem: Self-Concept: Goal: Will verbalize positive feelings about self Outcome: Adequate for Discharge Goal: Level of anxiety will decrease Outcome: Adequate for Discharge   Problem: Education: Goal: Knowledge of [place] Education information/materials will improve Outcome: Adequate for Discharge Goal: Emotional status will improve Outcome: Adequate for Discharge Goal: Mental status will improve Outcome: Adequate for Discharge Goal: Verbalization of understanding the information provided will improve Outcome: Adequate for Discharge   Problem: Activity: Goal: Interest or engagement in activities will improve Outcome: Adequate for Discharge Goal: Sleeping patterns will improve Outcome: Adequate for Discharge   Problem: Coping: Goal: Ability to verbalize frustrations and anger appropriately will improve Outcome: Adequate for Discharge Goal: Ability to demonstrate self-control will improve Outcome: Adequate for Discharge   Problem: Health Behavior/Discharge Planning: Goal: Identification of resources available to assist  in meeting health care needs will improve Outcome: Adequate for Discharge Goal: Compliance with treatment plan for underlying cause of condition will improve Outcome: Adequate for Discharge   Problem: Physical Regulation: Goal: Ability to maintain clinical measurements within normal limits will improve Outcome: Adequate for Discharge   Problem: Safety: Goal: Periods of time without injury will increase Outcome: Adequate for Discharge   Problem: Education: Goal: Utilization of techniques to improve thought processes will improve  Outcome: Adequate for Discharge Goal: Knowledge of the prescribed therapeutic regimen will improve Outcome: Adequate for Discharge   Problem: Activity: Goal: Interest or engagement in leisure activities will improve Outcome: Adequate for Discharge Goal: Imbalance in normal sleep/wake cycle will improve Outcome: Adequate for Discharge   Problem: Coping: Goal: Coping ability will improve Outcome: Adequate for Discharge Goal: Will verbalize feelings Outcome: Adequate for Discharge   Problem: Health Behavior/Discharge Planning: Goal: Ability to make decisions will improve Outcome: Adequate for Discharge Goal: Compliance with therapeutic regimen will improve Outcome: Adequate for Discharge   Problem: Role Relationship: Goal: Will demonstrate positive changes in social behaviors and relationships Outcome: Adequate for Discharge   Problem: Safety: Goal: Ability to disclose and discuss suicidal ideas will improve Outcome: Adequate for Discharge Goal: Ability to identify and utilize support systems that promote safety will improve Outcome: Adequate for Discharge   Problem: Self-Concept: Goal: Will verbalize positive feelings about self Outcome: Adequate for Discharge Goal: Level of anxiety will decrease Outcome: Adequate for Discharge

## 2019-02-17 NOTE — Progress Notes (Signed)
Discharge note: Patient reviewed discharge paperwork with RN including prescriptions, follow up appointments, and lab work. Patient given the opportunity to ask questions. All concerns were addressed. All belongings were returned to patient. Denied SI/HI/AVH. Patient thanked staff for their care while at the hospital.  Patient was discharged to the lobby where her ride was going to pick her up.

## 2019-02-17 NOTE — Progress Notes (Signed)
Recreation Therapy Notes  Date:  1.22.20 Time: 0930 Location: [address] Dayroom  Group Topic: Stress Management  Goal Area(s) Addresses:  Patient will identify positive stress management techniques. Patient will identify benefits of using stress management post d/c.  Intervention: Stress Management  Activity :  Meditation.  LRT introduced the stress management technique of meditation.  LRT played a meditation that focused on making the most of your day.  Patients were to listen and follow as meditation played to engage in activity.  Education:  Stress Management, Discharge Planning.   Education Outcome: Acknowledges Education  Clinical Observations/Feedback: Pt did not attend group.     Victorino Sparrow, LRT/CTRS         Victorino Sparrow A 02/17/2019 11:47 AM

## 2019-02-17 NOTE — [facility] Suicide Risk Assessment (Signed)
[facility] Discharge Suicide Risk Assessment   Principal Problem: MDD (major depressive disorder), recurrent severe, without psychosis ([place]) Discharge Diagnoses: Principal Problem:   MDD (major depressive disorder), recurrent severe, without psychosis ([place]) Active Problems:   Substance induced mood disorder ([place])   Total Time spent with patient: 30 minutes  Musculoskeletal: Strength & Muscle Tone: within normal limits Gait & Station: normal Patient leans: N/A  Psychiatric Specialty Exam: ROS no chest pain, no shortness of breath, no cough, no vomiting  Blood pressure (!) 145/93, pulse (!) 101, temperature 98.1 F (36.7 C), temperature source Oral, resp. rate 20, height 6' (1.829 m), weight 127 kg, SpO2 98 %.Body mass index is 37.97 kg/m.  General Appearance: Well Groomed  Eye Contact::  Good  Speech:  Normal [id number]  Volume:  Normal  Mood:  Reports mood as much improved/normalized.  Currently euthymic  Affect:  Appropriate and Full Range  Thought Process:  Linear and Descriptions of Associations: Intact  Orientation:  Full (Time, Place, and Person)  Thought Content:  No hallucinations, no delusions  Suicidal Thoughts:  No denies any suicidal or self-injurious ideations, denies homicidal or violent ideations  Homicidal Thoughts:  No  Memory:  Recent and remote grossly intact  Judgement:  Other:  Improving  Insight:  Improving  Psychomotor Activity:  Normal  Concentration:  Good  Recall:  Good  Fund of Knowledge:Good  Language: Good  Akathisia:  Negative  Handed:  Right  AIMS (if indicated):     Assets:  Communication Skills Desire for Improvement Resilience  Sleep:  Number of Hours: 5.75  Cognition: WNL  ADL's:  Intact   Mental Status Per Nursing Assessment::   On Admission:  NA  Demographic Factors:  66, divorced, 3 children, currently homeless.  Loss Factors: Psychosocial stressors, increased depression in the context of Mother's Day, recent relapse  Historical  Factors: History of bipolar disorder diagnoses in the past, history of cocaine use disorder, previous psychiatric admissions  Risk Reduction Factors:   Positive coping skills or problem solving skills  Continued Clinical Symptoms:  Currently patient presents alert, attentive, well-groomed, mood improved and currently euthymic, with a full range of affect.  No thought disorder, denies suicidal or self-injurious ideations, no homicidal or violent ideations, no hallucinations, no delusions, future oriented.  Expressing readiness for discharge.  Behavior on unit in good control, pleasant on approach.  Currently denies medication side effects.   Cognitive Features That Contribute To Risk:  No gross cognitive deficits noted upon discharge. Is alert , attentive, and oriented x 3   Suicide Risk:  Mild:  Suicidal ideation of limited frequency, intensity, duration, and specificity.  There are no identifiable plans, no associated intent, mild dysphoria and related symptoms, good self-control (both objective and subjective assessment), few other risk factors, and identifiable protective factors, including available and accessible social support.  Follow-up Information    [facility]. Go on 02/18/2019.   Why:  Your ACTT services will continue after discarge. Meyer Cory on your ACTT team will follow up with you on Tuesday, 5/19 @ 10am, please expect a call from him.  Contact information: [address] [place] [address] [phone]           Plan Of Care/Follow-up recommendations:  Activity:  As tolerated Diet:  Heart healthy Tests:  NA Other:  See below Patient is expressing readiness for discharge.  She is leaving unit in good spirits.  Expresses high motivation and sobriety/abstinence. Plans to continue working with ACT  Team. Plans to follow-up with PCP for medical issues as needed, and states she also plans to see a dermatologist regarding skin fungal  infection, which has improved with topical antimycotic.  Jenne Campus, MD 02/17/2019, 10:55 AM

## 2019-02-17 NOTE — Discharge Summary (Addendum)
Physician Discharge Summary Note  Patient:  Ashley Stevens is an 43 y.o., female MRN:  [id number] DOB:  [birth year] Patient phone:  [phone] (home)  Patient address:   [address] [place] [address],  Total Time spent with patient: 15 minutes  Date of Admission:  02/12/2019 Date of Discharge: 02/17/19  Reason for Admission: Suicidal ideation  Principal Problem: MDD (major depressive disorder), recurrent severe, without psychosis ([place]) Discharge Diagnoses: Principal Problem:   MDD (major depressive disorder), recurrent severe, without psychosis ([place]) Active Problems:   Substance induced mood disorder ([place])   Past Psychiatric History: Per admission H&P: History of depression and cocaine abuse. History of multiple hospitalizations, most recently at [facility] 12/27/18-01/01/19 for suicidal ideation. History of one suicide attempt 18 months ago.  Past Medical History:  Past Medical History:  Diagnosis Date  . Anxiety   . Depression   . Shingles   . Suicidal ideations     Past Surgical History:  Procedure Laterality Date  . c-section     Family History:  Family History  Problem Relation Age of Onset  . Heart failure Mother   . Kidney failure Brother   . Diabetes Other   . Hypertension Other    Family Psychiatric  History: Per admission H&P: Mother and brother with depression. Maternal aunt with schizophrenia. Alcohol abuse on paternal side. Social History:  Social History   Substance and Sexual Activity  Alcohol Use No     Social History   Substance and Sexual Activity  Drug Use Yes  . Types: Marijuana, Cocaine   Comment: Pt denies    Social History   Socioeconomic History  . Marital status: Legally Separated    Spouse name: Not on file  . Number of children: Not on file  . Years of education: Not on file  . Highest education level: Not on file  Occupational History  . Not on file  Social Needs  . Financial resource strain: Not on file  .  Food insecurity:    Worry: Not on file    Inability: Not on file  . Transportation needs:    Medical: Not on file    Non-medical: Not on file  Tobacco Use  . Smoking status: Current Every Day Smoker    Packs/day: 1.00    Types: Cigarettes  . Smokeless tobacco: Never Used  Substance and Sexual Activity  . Alcohol use: No  . Drug use: Yes    Types: Marijuana, Cocaine    Comment: Pt denies  . Sexual activity: Yes    Birth control/protection: Condom  Lifestyle  . Physical activity:    Days per week: Not on file    Minutes per session: Not on file  . Stress: Not on file  Relationships  . Social connections:    Talks on phone: Not on file    Gets together: Not on file    Attends religious service: Not on file    Active member of club or organization: Not on file    Attends meetings of clubs or organizations: Not on file    Relationship status: Not on file  Other Topics Concern  . Not on file  Social History Narrative  . Not on file    Hospital Course:  From admission H&P: Ashley Stevens is a 43 year old female with history of depression and cocaine abuse presenting for treatment of depression with suicidal ideation. She was recently admitted here for similar presentation and discharged on 01/01/19 on Prozac 20  mg daily and Seroquel 400 mg QHS. She reports she never took the Seroquel after discharge because she felt too sedated and believes the dose was too high. She has been off the Prozac for one month. She reports worsening depression over the last several weeks, which worsened especially over Mother's Day this weekend due to missing her mother (who passed away several years ago) as well as her children who are staying with family. She works in Therapist, art at a call center and has been working from home during the pandemic. She reports difficulty getting out of bed in the morning for work due to her depression. She reports getting together with old acquaintances yesterday and  relapsing on cocaine. She states she knew she had to get help at that point. Denies other drug or alcohol use. UDS positive for cocaine only. Admission BAL was not obtained. Patient continues to report suicidal ideation but no plan. Denies HI/AVH. Denies withdrawal symptoms. She reports that she recently started with ACT team services through [facility].  Ashley Stevens was admitted for depression with suicidal ideation. Patient initially reported depression but on second day of admission reported irritability with racing thoughts. She was observed yelling and cursing over the phone. She reported Seroquel was too sedating even at 50 mg. Prozac and Seroquel were discontinued and Abilify was started. Flagyl was started for BV. Neurontin was started for back pain. She participated in group therapy on the unit. She remained on the [facility] unit for 5 days. She stabilized with medication and therapy. She was discharged on the medications listed below. She has shown improvement with improved mood, affect, sleep, appetite, and interaction. She denies any SI/HI/AVH and contracts for safety. She agrees to follow up with [facility] ACT team (see below). Patient is provided with prescriptions and medication samples upon discharge. Her husband is picking her up for discharge home.  Physical Findings: AIMS: Facial and Oral Movements Muscles of Facial Expression: None, normal Lips and Perioral Area: None, normal Jaw: None, normal Tongue: None, normal,Extremity Movements Upper (arms, wrists, hands, fingers): None, normal Lower (legs, knees, ankles, toes): None, normal, Trunk Movements Neck, shoulders, hips: None, normal, Overall Severity Severity of abnormal movements (highest score from questions above): None, normal Incapacitation due to abnormal movements: None, normal Patient's awareness of abnormal movements (rate only patient's report): No Awareness, Dental Status Current problems with teeth and/or  dentures?: No Does patient usually wear dentures?: No  CIWA:    COWS:     Musculoskeletal: Strength & Muscle Tone: within normal limits Gait & Station: normal Patient leans: N/A  Psychiatric Specialty Exam: Physical Exam  Nursing note and vitals reviewed. Constitutional: She is oriented to person, place, and time. She appears well-developed and well-nourished.  Cardiovascular: Normal rate.  Respiratory: Effort normal.  Neurological: She is alert and oriented to person, place, and time.    Review of Systems  Constitutional: Negative.   Psychiatric/Behavioral: Positive for depression (improving) and substance abuse (cocaine). Negative for hallucinations and suicidal ideas. The patient is not nervous/anxious and does not have insomnia.     Blood pressure (!) 145/93, pulse (!) 101, temperature 98.1 F (36.7 C), temperature source Oral, resp. rate 20, height 6' (1.829 m), weight 127 kg, SpO2 98 %.Body mass index is 37.97 kg/m.  See MD's discharge SRA        Has this patient used any form of tobacco in the last 30 days? (Cigarettes, Smokeless Tobacco, Cigars, and/or Pipes) Yes, a prescription for an  FDA-approved medication for tobacco cessation was offered at discharge.   Blood Alcohol level:  Lab Results  Component Value Date   ETH <10 11/10/2017   ETH <10 17/61/6073    Metabolic Disorder Labs:  Lab Results  Component Value Date   HGBA1C 5.6 02/13/2019   MPG 114.02 02/13/2019   MPG 122.63 11/10/2017   No results found for: PROLACTIN Lab Results  Component Value Date   CHOL 156 12/28/2018   TRIG 69 12/28/2018   HDL 43 12/28/2018   CHOLHDL 3.6 12/28/2018   VLDL 14 12/28/2018   LDLCALC 99 12/28/2018   LDLCALC 124 (H) 11/10/2017    See Psychiatric Specialty Exam and Suicide Risk Assessment completed by Attending Physician prior to discharge.  Discharge destination:  Home  Is patient on multiple antipsychotic therapies at discharge:  No   Has Patient had three or  more failed trials of antipsychotic monotherapy by history:  No  Recommended Plan for Multiple Antipsychotic Therapies: NA  Discharge Instructions    Discharge instructions   Complete by:  As directed    Patient is instructed to take all prescribed medications as recommended. Report any side effects or adverse reactions to your outpatient psychiatrist. Patient is instructed to abstain from alcohol and illegal drugs while on prescription medications. In the event of worsening symptoms, patient is instructed to call the crisis hotline, 911, or go to the nearest emergency department for evaluation and treatment.     Allergies as of 02/17/2019      Reactions   Percocet [oxycodone-acetaminophen] Nausea And Vomiting      Medication List    STOP taking these medications   FLUoxetine 20 MG capsule Commonly known as:  PROZAC   GARLIC PO   QUEtiapine 710 MG tablet Commonly known as:  SEROQUEL   saccharomyces boulardii 250 MG capsule Commonly known as:  FLORASTOR   traZODone 50 MG tablet Commonly known as:  DESYREL   vitamin B-12 1000 MCG tablet Commonly known as:  CYANOCOBALAMIN     TAKE these medications     Indication  ARIPiprazole 15 MG tablet Commonly known as:  ABILIFY Take 1 tablet (15 mg total) by mouth daily. Start taking on:  Feb 18, 2019  Indication:  Mood   gabapentin 100 MG capsule Commonly known as:  NEURONTIN Take 1 capsule (100 mg total) by mouth 3 (three) times daily.  Indication:  Neuropathic Pain   hydrOXYzine 25 MG tablet Commonly known as:  ATARAX/VISTARIL Take 1 tablet (25 mg total) by mouth every 6 (six) hours as needed for anxiety. What changed:  when to take this  Indication:  Feeling Anxious   metroNIDAZOLE 500 MG tablet Commonly known as:  FLAGYL Take 1 tablet (500 mg total) by mouth every 12 (twelve) hours. For infection  Indication:  Vaginosis caused by Bacteria   nicotine 21 mg/24hr patch Commonly known as:  NICODERM CQ - dosed in  mg/24 hours Place 1 patch (21 mg total) onto the skin daily. Start taking on:  Feb 18, 2019  Indication:  Nicotine Addiction      Follow-up Information    [facility], [facility]. Go on 02/17/2019.   Why:  Your ACTT services will continue after discarge. Meyer Cory on your ACTT team will follow up with you on Tuesday, 5/19, please expect a call from him.  Contact information: [address] [place] [address] [phone]           Follow-up recommendations: Activity as tolerated. Diet  as recommended by primary care physician. Keep all scheduled follow-up appointments as recommended.  Comments:   Patient is instructed to take all prescribed medications as recommended. Report any side effects or adverse reactions to your outpatient psychiatrist. Patient is instructed to abstain from alcohol and illegal drugs while on prescription medications. In the event of worsening symptoms, patient is instructed to call the crisis hotline, 911, or go to the nearest emergency department for evaluation and treatment.  Signed: Connye Burkitt, NP 02/17/2019, 9:47 AM   Patient seen, Suicide Assessment Completed.  Disposition Plan Reviewed
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
## 2019-05-12 ENCOUNTER — Ambulatory Visit: Payer: Medicaid Other | Admitting: Family Medicine

## 2019-12-13 ENCOUNTER — Encounter (HOSPITAL_COMMUNITY): Payer: Self-pay

## 2019-12-13 ENCOUNTER — Other Ambulatory Visit: Payer: Self-pay

## 2019-12-13 ENCOUNTER — Emergency Department (HOSPITAL_COMMUNITY)
Admission: EM | Admit: 2019-12-13 | Discharge: 2019-12-14 | Disposition: A | Discharge status: Other Acute Inpatient Hospital | Payer: Medicaid Other | Attending: Emergency Medicine | Admitting: Emergency Medicine

## 2019-12-13 DIAGNOSIS — Z20822 Contact with and (suspected) exposure to covid-19: Secondary | ICD-10-CM | POA: Insufficient documentation

## 2019-12-13 DIAGNOSIS — F332 Major depressive disorder, recurrent severe without psychotic features: Secondary | ICD-10-CM | POA: Diagnosis present

## 2019-12-13 DIAGNOSIS — T43212A Poisoning by selective serotonin and norepinephrine reuptake inhibitors, intentional self-harm, initial encounter: Secondary | ICD-10-CM | POA: Diagnosis present

## 2019-12-13 DIAGNOSIS — T43592A Poisoning by other antipsychotics and neuroleptics, intentional self-harm, initial encounter: Secondary | ICD-10-CM | POA: Diagnosis not present

## 2019-12-13 DIAGNOSIS — N390 Urinary tract infection, site not specified: Secondary | ICD-10-CM | POA: Diagnosis not present

## 2019-12-13 DIAGNOSIS — F17210 Nicotine dependence, cigarettes, uncomplicated: Secondary | ICD-10-CM | POA: Insufficient documentation

## 2019-12-13 DIAGNOSIS — F329 Major depressive disorder, single episode, unspecified: Secondary | ICD-10-CM | POA: Diagnosis not present

## 2019-12-13 DIAGNOSIS — F1414 Cocaine abuse with cocaine-induced mood disorder: Secondary | ICD-10-CM

## 2019-12-13 DIAGNOSIS — T50904A Poisoning by unspecified drugs, medicaments and biological substances, undetermined, initial encounter: Secondary | ICD-10-CM

## 2019-12-13 DIAGNOSIS — F1994 Other psychoactive substance use, unspecified with psychoactive substance-induced mood disorder: Secondary | ICD-10-CM

## 2019-12-13 DIAGNOSIS — F1910 Other psychoactive substance abuse, uncomplicated: Secondary | ICD-10-CM

## 2019-12-13 DIAGNOSIS — R42 Dizziness and giddiness: Secondary | ICD-10-CM | POA: Insufficient documentation

## 2019-12-13 LAB — MAGNESIUM: Magnesium: 1.6 mg/dL — ABNORMAL LOW (ref 1.7–2.4)

## 2019-12-13 LAB — RAPID URINE DRUG SCREEN, HOSP PERFORMED
Amphetamines: NOT DETECTED
Barbiturates: NOT DETECTED
Benzodiazepines: POSITIVE — AB
Cocaine: POSITIVE — AB
Opiates: NOT DETECTED
Tetrahydrocannabinol: POSITIVE — AB

## 2019-12-13 LAB — CBC WITH DIFFERENTIAL/PLATELET
Abs Immature Granulocytes: 0.04 10*3/uL (ref 0.00–0.07)
Basophils Absolute: 0.1 10*3/uL (ref 0.0–0.1)
Basophils Relative: 1 %
Eosinophils Absolute: 0.1 10*3/uL (ref 0.0–0.5)
Eosinophils Relative: 1 %
HCT: 41.4 % (ref 36.0–46.0)
Hemoglobin: 12.9 g/dL (ref 12.0–15.0)
Immature Granulocytes: 0 %
Lymphocytes Relative: 20 %
Lymphs Abs: 2 10*3/uL (ref 0.7–4.0)
MCH: 25.9 pg — ABNORMAL LOW (ref 26.0–34.0)
MCHC: 31.2 g/dL (ref 30.0–36.0)
MCV: 83 fL (ref 80.0–100.0)
Monocytes Absolute: 0.6 10*3/uL (ref 0.1–1.0)
Monocytes Relative: 6 %
Neutro Abs: 7 10*3/uL (ref 1.7–7.7)
Neutrophils Relative %: 72 %
Platelets: 333 10*3/uL (ref 150–400)
RBC: 4.99 MIL/uL (ref 3.87–5.11)
RDW: 14.8 % (ref 11.5–15.5)
WBC: 9.8 10*3/uL (ref 4.0–10.5)
nRBC: 0 % (ref 0.0–0.2)

## 2019-12-13 LAB — URINALYSIS, ROUTINE W REFLEX MICROSCOPIC
Bilirubin Urine: NEGATIVE
Glucose, UA: NEGATIVE mg/dL
Hgb urine dipstick: NEGATIVE
Ketones, ur: NEGATIVE mg/dL
Leukocytes,Ua: NEGATIVE
Nitrite: POSITIVE — AB
Protein, ur: NEGATIVE mg/dL
Specific Gravity, Urine: 1.008 (ref 1.005–1.030)
pH: 7 (ref 5.0–8.0)

## 2019-12-13 LAB — COMPREHENSIVE METABOLIC PANEL
ALT: 9 U/L (ref 0–44)
AST: 16 U/L (ref 15–41)
Albumin: 3.2 g/dL — ABNORMAL LOW (ref 3.5–5.0)
Alkaline Phosphatase: 63 U/L (ref 38–126)
Anion gap: 8 (ref 5–15)
BUN: 11 mg/dL (ref 6–20)
CO2: 24 mmol/L (ref 22–32)
Calcium: 9 mg/dL (ref 8.9–10.3)
Chloride: 105 mmol/L (ref 98–111)
Creatinine, Ser: 1.11 mg/dL — ABNORMAL HIGH (ref 0.44–1.00)
GFR calc Af Amer: >60 mL/min (ref >60)
GFR calc non Af Amer: >60 mL/min (ref >60)
Glucose, Bld: 114 mg/dL — ABNORMAL HIGH (ref 70–99)
Potassium: 3.5 mmol/L (ref 3.5–5.1)
Sodium: 137 mmol/L (ref 135–145)
Total Bilirubin: 0.4 mg/dL (ref 0.3–1.2)
Total Protein: 6.2 g/dL — ABNORMAL LOW (ref 6.5–8.1)

## 2019-12-13 LAB — SALICYLATE LEVEL: Salicylate Lvl: <7 mg/dL — ABNORMAL LOW (ref 7.0–30.0)

## 2019-12-13 LAB — ETHANOL: Alcohol, Ethyl (B): <10 mg/dL (ref <10)

## 2019-12-13 LAB — ACETAMINOPHEN LEVEL: Acetaminophen (Tylenol), Serum: <10 ug/mL — ABNORMAL LOW (ref 10–30)

## 2019-12-13 LAB — HCG, QUANTITATIVE, PREGNANCY: hCG, Beta Chain, Quant, S: <1 m[IU]/mL (ref <5)

## 2019-12-13 MED ORDER — IBUPROFEN 800 MG PO TABS
800.0000 mg | ORAL_TABLET | Freq: Once | ORAL | Status: AC
  Administered 2019-12-13: 800 mg via ORAL
  Filled 2019-12-13: qty 1

## 2019-12-13 MED ORDER — CEPHALEXIN 500 MG PO CAPS
500.0000 mg | ORAL_CAPSULE | Freq: Two times a day (BID) | ORAL | Status: DC
  Administered 2019-12-13 – 2019-12-14 (×2): 500 mg via ORAL
  Filled 2019-12-13 (×2): qty 1
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
## 2019-12-13 NOTE — ED Provider Notes (Addendum)
[facility] DEPT Provider Note   CSN: [id number] Arrival date & time: 12/13/19  1729     History Chief Complaint  Patient presents with  . Drug Overdose    Ashley Stevens is a 44 y.o. female presenting for evaluation after drug overdose.  Patient states she has been more depressed recently.  She had an argument with her sister.  She then took a large quantity of Zoloft and Seroquel, upwards of 30 pills.  This happened about 1 hour prior to arrival, around 5 PM.  Patient states she did not do this in an attempt to kill herself or hurt herself, but instead she was seeking attention.  She denies SI at this time, states she does not want to hurt herself at this time.  She denies HI or AVH.  Patient states she is feeling a little bit lightheaded and thirsty.  Otherwise has no symptoms.  She denies headache, vision change, chest pain, shortness breath, nausea, vomiting, abdominal pain, urinary symptoms, normal bowel movements.  She states she has no other medical problems, takes no medications other than Zoloft and Seroquel.  She denies tobacco or alcohol use.  She says she uses marijuana, but denies other drug use.  HPI     Past Medical History:  Diagnosis Date  . Anxiety   . Depression   . Shingles   . Suicidal ideations     Patient Active Problem List   Diagnosis Date Noted  . Substance induced mood disorder ([place]) 02/12/2019  . Borderline personality disorder ([place]) 12/29/2018  . 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 Cocaine use disorder, moderate, dependence ([place]) 05/07/2016  . Cannabis use disorder, moderate, dependence ([place]) 05/07/2016    Past Surgical History:  Procedure Laterality Date  . c-section       OB History   No obstetric history on file.     Family History  Problem Relation Age of Onset  . Heart failure Mother   . Kidney failure Brother   . Diabetes Other   . Hypertension  Other     Social History   Tobacco Use  . Smoking status: Current Every Day Smoker    Packs/day: 1.00    Types: Cigarettes  . Smokeless tobacco: Never Used  Substance Use Topics  . Alcohol use: No  . Drug use: Yes    Types: Marijuana, Cocaine    Comment: Pt denies    Home Medications Prior to Admission medications   Medication Sig Start Date End Date Taking? Authorizing Provider  ALPRAZolam Duanne Moron) 1 MG tablet Take 1 mg by mouth 3 (three) times daily as needed for anxiety.  12/04/19  Yes [provider]  QUEtiapine (SEROQUEL) 50 MG tablet Take 50 mg by mouth daily. 11/27/19  Yes [provider]  sertraline (ZOLOFT) 100 MG tablet Take 100 mg by mouth at bedtime. 11/27/19  Yes [provider]    Allergies    Percocet [oxycodone-acetaminophen]  Review of Systems   Review of Systems  Neurological: Positive for light-headedness.  Psychiatric/Behavioral: Positive for dysphoric mood.  All other systems reviewed and are negative.   Physical Exam Updated Vital Signs BP (!) 123/57 (BP Location: Right Arm)   Pulse 70   Temp 98.1 F (36.7 C) (Oral)   Resp 15   SpO2 98%   Physical Exam Vitals and nursing note reviewed.  Constitutional:      General: She is not in acute distress.  Appearance: She is well-developed.     Comments: Appears nontoxic  HENT:     Head: Normocephalic and atraumatic.  Eyes:     Extraocular Movements: Extraocular movements intact.     Conjunctiva/sclera: Conjunctivae normal.     Pupils: Pupils are equal, round, and reactive to light.  Cardiovascular:     Rate and Rhythm: Normal rate and regular rhythm.     Pulses: Normal pulses.  Pulmonary:     Effort: Pulmonary effort is normal. No respiratory distress.     Breath sounds: Normal breath sounds. No wheezing.  Abdominal:     General: There is no distension.     Palpations: Abdomen is soft. There is no mass.     Tenderness: There is no abdominal tenderness. There is no  guarding or rebound.  Musculoskeletal:        General: Normal range of motion.     Cervical back: Normal range of motion and neck supple.  Skin:    General: Skin is warm and dry.     Capillary Refill: Capillary refill takes less than 2 seconds.  Neurological:     Mental Status: She is alert and oriented to person, place, and time.  Psychiatric:        Judgment: Judgment is impulsive.     Comments: Pt appears high. Denies SI/HI/AVH at this time.      ED Results / Procedures / Treatments   Labs (all labs ordered are listed, but only abnormal results are displayed) Labs Reviewed  CBC WITH DIFFERENTIAL/PLATELET - Abnormal; Notable for the following components:      Result Value   MCH 25.9 (*)    All other components within normal limits  COMPREHENSIVE METABOLIC PANEL - Abnormal; Notable for the following components:   Glucose, Bld 114 (*)    Creatinine, Ser 1.11 (*)    Total Protein 6.2 (*)    Albumin 3.2 (*)    All other components within normal limits  ACETAMINOPHEN LEVEL - Abnormal; Notable for the following components:   Acetaminophen (Tylenol), Serum <10 (*)    All other components within normal limits  SALICYLATE LEVEL - Abnormal; Notable for the following components:   Salicylate Lvl [id number] (*)    All other components within normal limits  URINALYSIS, ROUTINE W REFLEX MICROSCOPIC - Abnormal; Notable for the following components:   Nitrite POSITIVE (*)    Bacteria, UA MANY (*)    All other components within normal limits  RAPID URINE DRUG SCREEN, HOSP PERFORMED - Abnormal; Notable for the following components:   Cocaine POSITIVE (*)    Benzodiazepines POSITIVE (*)    Tetrahydrocannabinol POSITIVE (*)    All other components within normal limits  MAGNESIUM - Abnormal; Notable for the following components:   Magnesium 1.6 (*)    All other components within normal limits  RESPIRATORY PANEL BY RT PCR (FLU A&B, COVID)  ETHANOL  HCG, QUANTITATIVE, PREGNANCY  ACETAMINOPHEN  LEVEL    EKG EKG Interpretation  Date/Time:  Saturday December 13 2019 22:25:58 EST Ventricular Rate:  69 PR Interval:    QRS Duration: 72 QT Interval:  407 QTC Calculation: 436 R Axis:   79 Text Interpretation: Sinus rhythm No STEMI Confirmed by Octaviano Glow [phone]) on 12/13/2019 10:29:21 PM   Radiology No results found.  Procedures Procedures (including critical care time)  Medications Ordered in ED Medications  cephALEXin (KEFLEX) capsule 500 mg (500 mg Oral Given 12/13/19 2259)  ibuprofen (ADVIL) tablet 800 mg (800 mg Oral Given  12/13/19 2005)    ED Course  I have reviewed the triage vital signs and the nursing notes.  Pertinent labs & imaging results that were available during my care of the patient were reviewed by me and considered in my medical decision making (see chart for details).    MDM Rules/Calculators/A&P                      Patient presented for evaluation after taking excessive amounts of Zoloft and Seroquel.  On exam, patient appears high, but otherwise nontoxic.  She is denying SI, HI, AVH, however states that she did this as she is seeking attention.  She will need to be evaluated by our behavioral health team, but first will need to be medically cleared. Discussed with poison control, who recommends a minimum of the 6-hour observation with medical clearance.  Recommends checking an EKG to look for QTC.  Monitor for hypotension, CNS depression, and tachycardia.  If after 6 hours, patient remained stable, repeat EKG and patient can be medically cleared.  UDS positive for cocaine, benzos, and marijuana.  Labs interpreted by me, overall reassuring.  Urine positive for nitrates and many bacteria. As such, will tx for UTI with keflex.   [facility] team recommends inpatient. Pt is aware of recommendations and is agreeable to stay.   Final Clinical Impression(s) / ED Diagnoses Final diagnoses:  Drug overdose, undetermined intent, initial encounter  Urinary tract  infection without hematuria, site unspecified  Polysubstance abuse [facility])    Rx / DC Orders ED Discharge Orders    None       Franchot Heidelberg, PA-C 12/13/19 2321    Franchot Heidelberg, PA-C 12/13/19 2359    Wyvonnia Dusky, MD 12/14/19 1221

## 2019-12-13 NOTE — ED Triage Notes (Signed)
EMS reports feeling depressed and took unknown amount of Zoloft, states she was attention seeking.   BP 152/94 HR 100 RR 20 Sp02 96 RA CBG 102   20LAC

## 2019-12-14 ENCOUNTER — Inpatient Hospital Stay (HOSPITAL_COMMUNITY): Admission: AD | Admit: 2019-12-14 | Payer: Medicaid Other | Source: Home / Self Care | Admitting: Psychiatry

## 2019-12-14 DIAGNOSIS — F1414 Cocaine abuse with cocaine-induced mood disorder: Secondary | ICD-10-CM

## 2019-12-14 LAB — RESPIRATORY PANEL BY RT PCR (FLU A&B, COVID)
Influenza A by PCR: NEGATIVE
Influenza B by PCR: NEGATIVE
SARS Coronavirus 2 by RT PCR: NEGATIVE

## 2019-12-14 LAB — ACETAMINOPHEN LEVEL: Acetaminophen (Tylenol), Serum: <10 ug/mL — ABNORMAL LOW (ref 10–30)

## 2019-12-14 MED ORDER — CYPROHEPTADINE HCL 4 MG PO TABS
4.0000 mg | ORAL_TABLET | Freq: Two times a day (BID) | ORAL | Status: DC
  Administered 2019-12-14: 4 mg via ORAL
  Filled 2019-12-14 (×2): qty 1

## 2019-12-14 MED ORDER — GABAPENTIN 300 MG PO CAPS
300.0000 mg | ORAL_CAPSULE | Freq: Two times a day (BID) | ORAL | Status: DC
  Administered 2019-12-14: 300 mg via ORAL
  Filled 2019-12-14: qty 1

## 2019-12-14 NOTE — ED Notes (Signed)
Meal tray provided.

## 2019-12-14 NOTE — Consult Note (Addendum)
Telepsych Consultation   Reason for Consult:  Suicidal behaviors Referring Physician:  EDP Location of Patient: [facility] Location of Provider: [facility]  Patient Identification: Ashley Stevens MRN:  [id number] Principal Diagnosis: Cocaine abuse with cocaine-induced mood disorder ([place]) Diagnosis:  Principal Problem:   Cocaine abuse with cocaine-induced mood disorder ([place]) Active Problems:   MDD (major depressive disorder), recurrent severe, without psychosis ([place])   Total Time spent with patient: 30 minutes  Subjective:    Ashley Stevens, 44 y.o., female patient presented to [facility] for evaluation suicidal behaviors, intentional overdose.  Patient seen via telepsych by this provider; chart reviewed and consulted with Dr. Darleene Cleaver on 12/14/19.  On evaluation Ashley Stevens reports that she was not trying to kill herself, states she was arguing with her son and became overwhelmed.  States she took the pills as an attempt to get attention.  She is very vague and states, "the argument was personal and I don't want to tell you what it was about."   She denies previous suicide attempts or self injurious behaviors but does endorse polysubstance usage.    Past Psychiatric History: MDD, BPD, polysubstance use disorder.   Risk to Self: Suicidal Ideation: No Suicidal Intent: No Is patient at risk for suicide?: Yes Suicidal Plan?: No(Pt overdosed on medication) Access to Means: Yes Specify Access to Suicidal Means: Access to prescription medications What has been your use of drugs/alcohol within the last 12 months?: Pt reports a history of using cocaine and marijuana How many times?: 1 Other Self Harm Risks: Pt denies Triggers for Past Attempts: Other personal contacts Intentional Self Injurious Behavior: None Risk to Others: Homicidal Ideation: No Thoughts of Harm to Others: No Current Homicidal Intent: No Current Homicidal Plan: No Access to Homicidal Means: No Identified  Victim: None History of harm to others?: No Assessment of Violence: None Noted Violent Behavior Description: Pt denies history of violence Does patient have access to weapons?: No Criminal Charges Pending?: No Does patient have a court date: No Prior Inpatient Therapy: Prior Inpatient Therapy: Yes Prior Therapy Dates: 01/2019, multiple admits Prior Therapy Facilty/Provider(s): [facility] Reason for Treatment: MDD Prior Outpatient Therapy: Prior Outpatient Therapy: Yes Prior Therapy Dates: Current Prior Therapy Facilty/Provider(s): [facility] Reason for Treatment: MDD Does patient have an ACCT team?: Yes([facility]) Does patient have Intensive In-House Services?  : No Does patient have [facility] services? : No Does patient have P4CC services?: No  Past Medical History:  Past Medical History:  Diagnosis Date  . Anxiety   . Depression   . Shingles   . Suicidal ideations     Past Surgical History:  Procedure Laterality Date  . c-section     Family History:  Family History  Problem Relation Age of Onset  . Heart failure Mother   . Kidney failure Brother   . Diabetes Other   . Hypertension Other    Family Psychiatric  History: unknown Social History:  Social History   Substance and Sexual Activity  Alcohol Use No     Social History   Substance and Sexual Activity  Drug Use Yes  . Types: Marijuana, Cocaine   Comment: Pt denies    Social History   Socioeconomic History  . Marital status: Legally Separated    Spouse name: Not on file  . Number of children: Not on file  . Years of education: Not on file  . Highest education level: Not on file  Occupational History  . Not  on file  Tobacco Use  . Smoking status: Current Every Day Smoker    Packs/day: 1.00    Types: Cigarettes  . Smokeless tobacco: Never Used  Substance and Sexual Activity  . Alcohol use: No  . Drug use: Yes    Types: Marijuana, Cocaine    Comment: Pt denies  . Sexual activity:  Yes    Birth control/protection: Condom  Other Topics Concern  . Not on file  Social History Narrative  . Not on file   Social Determinants of Health   Financial Resource Strain:   . Difficulty of Paying Living Expenses:   Food Insecurity:   . Worried About Charity fundraiser in the Last Year:   . Arboriculturist in the Last Year:   Transportation Needs:   . Film/video editor (Medical):   Marland Kitchen Lack of Transportation (Non-Medical):   Physical Activity:   . Days of Exercise per Week:   . Minutes of Exercise per Session:   Stress:   . Feeling of Stress :   Social Connections:   . Frequency of Communication with Friends and Family:   . Frequency of Social Gatherings with Friends and Family:   . Attends Religious Services:   . Active Member of Clubs or Organizations:   . Attends Archivist Meetings:   Marland Kitchen Marital Status:    Additional Social History:    Allergies:   Allergies  Allergen Reactions  . Percocet [Oxycodone-Acetaminophen] Nausea And Vomiting    Labs:  Results for orders placed or performed during the hospital encounter of 12/13/19 (from the past 48 hour(s))  CBC with Differential     Status: Abnormal   Collection Time: 12/13/19  5:51 PM  Result Value Ref Range   WBC 9.8 4.0 - 10.5 K/uL   RBC 4.99 3.87 - 5.11 MIL/uL   Hemoglobin 12.9 12.0 - 15.0 g/dL   HCT 41.4 36.0 - 46.0 %   MCV 83.0 80.0 - 100.0 fL   MCH 25.9 (L) 26.0 - 34.0 pg   MCHC 31.2 30.0 - 36.0 g/dL   RDW 14.8 11.5 - 15.5 %   Platelets 333 150 - 400 K/uL   nRBC 0.0 0.0 - 0.2 %   Neutrophils Relative % 72 %   Neutro Abs 7.0 1.7 - 7.7 K/uL   Lymphocytes Relative 20 %   Lymphs Abs 2.0 0.7 - 4.0 K/uL   Monocytes Relative 6 %   Monocytes Absolute 0.6 0.1 - 1.0 K/uL   Eosinophils Relative 1 %   Eosinophils Absolute 0.1 0.0 - 0.5 K/uL   Basophils Relative 1 %   Basophils Absolute 0.1 0.0 - 0.1 K/uL   Immature Granulocytes 0 %   Abs Immature Granulocytes 0.04 0.00 - 0.07 K/uL     Comment: Performed at [facility], [place] [address] [place], [place] [address]  Comprehensive metabolic panel     Status: Abnormal   Collection Time: 12/13/19  5:51 PM  Result Value Ref Range   Sodium 137 135 - 145 mmol/L   Potassium 3.5 3.5 - 5.1 mmol/L   Chloride 105 98 - 111 mmol/L   CO2 24 22 - 32 mmol/L   Glucose, Bld 114 (H) 70 - 99 mg/dL    Comment: Glucose reference range applies only to samples taken after fasting for at least 8 hours.   BUN 11 6 - 20 mg/dL   Creatinine, Ser 1.11 (H) 0.44 - 1.00 mg/dL   Calcium 9.0 8.9 -  10.3 mg/dL   Total Protein 6.2 (L) 6.5 - 8.1 g/dL   Albumin 3.2 (L) 3.5 - 5.0 g/dL   AST 16 15 - 41 U/L   ALT 9 0 - 44 U/L   Alkaline Phosphatase 63 38 - 126 U/L   Total Bilirubin 0.4 0.3 - 1.2 mg/dL   GFR calc non Af Amer >60 >60 mL/min   GFR calc Af Amer >60 >60 mL/min   Anion gap 8 5 - 15    Comment: Performed at [facility], [place] [address], [place], [place] [address]  Acetaminophen level     Status: Abnormal   Collection Time: 12/13/19  5:51 PM  Result Value Ref Range   Acetaminophen (Tylenol), Serum <10 (L) 10 - 30 ug/mL    Comment: (NOTE) Therapeutic concentrations vary significantly. A range of 10-30 ug/mL  may be an effective concentration for many patients. However, some  are best treated at concentrations outside of this range. Acetaminophen concentrations >150 ug/mL at 4 hours after ingestion  and >50 ug/mL at 12 hours after ingestion are often associated with  toxic reactions. Performed at [facility], [place] [address] [place], [place] [id number]   Salicylate level     Status: Abnormal   Collection Time: 12/13/19  5:51 PM  Result Value Ref Range   Salicylate Lvl [id number] (L) 7.0 - 30.0 mg/dL    Comment: Performed at [facility], [place] [address], [place], [place] [address]  Urinalysis, Routine w reflex microscopic     Status: Abnormal   Collection Time: 12/13/19   5:51 PM  Result Value Ref Range   Color, Urine YELLOW YELLOW   APPearance CLEAR CLEAR   Specific Gravity, Urine 1.008 1.005 - 1.030   pH 7.0 5.0 - 8.0   Glucose, UA NEGATIVE NEGATIVE mg/dL   Hgb urine dipstick NEGATIVE NEGATIVE   Bilirubin Urine NEGATIVE NEGATIVE   Ketones, ur NEGATIVE NEGATIVE mg/dL   Protein, ur NEGATIVE NEGATIVE mg/dL   Nitrite POSITIVE (A) NEGATIVE   Leukocytes,Ua NEGATIVE NEGATIVE   WBC, UA 0-5 0 - 5 WBC/hpf   Bacteria, UA MANY (A) NONE SEEN   Squamous Epithelial / LPF 0-5 0 - 5   Mucus PRESENT     Comment: Performed at [facility], [place] [address], [place], [place] [address]  Rapid urine drug screen (hospital performed)     Status: Abnormal   Collection Time: 12/13/19  5:51 PM  Result Value Ref Range   Opiates NONE DETECTED NONE DETECTED   Cocaine POSITIVE (A) NONE DETECTED   Benzodiazepines POSITIVE (A) NONE DETECTED   Amphetamines NONE DETECTED NONE DETECTED   Tetrahydrocannabinol POSITIVE (A) NONE DETECTED   Barbiturates NONE DETECTED NONE DETECTED    Comment: (NOTE) DRUG SCREEN FOR MEDICAL PURPOSES ONLY.  IF CONFIRMATION IS NEEDED FOR ANY PURPOSE, NOTIFY LAB WITHIN 5 DAYS. LOWEST DETECTABLE LIMITS FOR URINE DRUG SCREEN Drug Class                     Cutoff (ng/mL) Amphetamine and metabolites    1000 Barbiturate and metabolites    200 Benzodiazepine                 [id number] Tricyclics and metabolites     300 Opiates and metabolites        300 Cocaine and metabolites        300 THC  50 Performed at [facility], [place] [address] [place], [place] [address]   Ethanol     Status: None   Collection Time: 12/13/19  5:51 PM  Result Value Ref Range   Alcohol, Ethyl (B) <10 <10 mg/dL    Comment: (NOTE) Lowest detectable limit for serum alcohol is 10 mg/dL. For medical purposes only. Performed at [facility], [place] [address], [place], [place] [address]   hCG,  quantitative, pregnancy     Status: None   Collection Time: 12/13/19  5:52 PM  Result Value Ref Range   hCG, Beta Chain, Quant, S <1 <5 mIU/mL    Comment:          GEST. AGE      CONC.  (mIU/mL)   <=1 WEEK        5 - 50     2 WEEKS       50 - 500     3 WEEKS       100 - 10,000     4 WEEKS     1,000 - 30,000     5 WEEKS     3,500 - 115,000   6-8 WEEKS     12,000 - 270,000    12 WEEKS     15,000 - 220,000        FEMALE AND NON-PREGNANT FEMALE:     LESS THAN 5 mIU/mL Performed at [facility], [place] [address], [place], [place] [address]   Magnesium     Status: Abnormal   Collection Time: 12/13/19  6:13 PM  Result Value Ref Range   Magnesium 1.6 (L) 1.7 - 2.4 mg/dL    Comment: Performed at [facility], [place] [address] [place], [place] [address]  Acetaminophen level     Status: Abnormal   Collection Time: 12/13/19 11:40 PM  Result Value Ref Range   Acetaminophen (Tylenol), Serum <10 (L) 10 - 30 ug/mL    Comment: (NOTE) Therapeutic concentrations vary significantly. A range of 10-30 ug/mL  may be an effective concentration for many patients. However, some  are best treated at concentrations outside of this range. Acetaminophen concentrations >150 ug/mL at 4 hours after ingestion  and >50 ug/mL at 12 hours after ingestion are often associated with  toxic reactions. Performed at [facility], [place] [address] [place], [place] [address]   Respiratory Panel by RT PCR (Flu A&B, Covid) - Nasopharyngeal Swab     Status: None   Collection Time: 12/13/19 11:41 PM   Specimen: Nasopharyngeal Swab  Result Value Ref Range   SARS Coronavirus 2 by RT PCR NEGATIVE NEGATIVE    Comment: (NOTE) SARS-CoV-2 target nucleic acids are NOT DETECTED. The SARS-CoV-2 RNA is generally detectable in upper respiratoy specimens during the acute phase of infection. The lowest concentration of SARS-CoV-2 viral copies this assay can detect is 131 copies/mL.  A negative result does not preclude SARS-Cov-2 infection and should not be used as the sole basis for treatment or other patient management decisions. A negative result may occur with  improper specimen collection/handling, submission of specimen other than nasopharyngeal swab, presence of viral mutation(s) within the areas targeted by this assay, and inadequate number of viral copies (<131 copies/mL). A negative result must be combined with clinical observations, patient history, and epidemiological information. The expected result is Negative. Fact Sheet for Patients:  PinkCheek.be Fact Sheet for Healthcare Providers:  GravelBags.it This test is not yet ap proved or  cleared by the [place] and  has been authorized for detection and/or diagnosis of SARS-CoV-2 by FDA under an Emergency Use Authorization (EUA). This EUA will remain  in effect (meaning this test can be used) for the duration of the COVID-19 declaration under Section 564(b)(1) of the Act, 21 U.S.C. section 360bbb-3(b)(1), unless the authorization is terminated or revoked sooner.    Influenza A by PCR NEGATIVE NEGATIVE   Influenza B by PCR NEGATIVE NEGATIVE    Comment: (NOTE) The Xpert Xpress SARS-CoV-2/FLU/RSV assay is intended as an aid in  the diagnosis of influenza from Nasopharyngeal swab specimens and  should not be used as a sole basis for treatment. Nasal washings and  aspirates are unacceptable for Xpert Xpress SARS-CoV-2/FLU/RSV  testing. Fact Sheet for Patients: PinkCheek.be Fact Sheet for Healthcare Providers: GravelBags.it This test is not yet approved or cleared by the [place] FDA and  has been authorized for detection and/or diagnosis of SARS-CoV-2 by  FDA under an Emergency Use Authorization (EUA). This EUA will remain  in effect (meaning this test can be used) for the  duration of the  Covid-19 declaration under Section 564(b)(1) of the Act, 21  U.S.C. section 360bbb-3(b)(1), unless the authorization is  terminated or revoked. Performed at [facility], [place] [address], [place], [place] [address]     Medications:  Current Facility-Administered Medications  Medication Dose Route Frequency Provider Last Rate Last Admin  . cephALEXin (KEFLEX) capsule 500 mg  500 mg Oral BID Caccavale, Sophia, PA-C   500 mg at 12/14/19 0950  . cyproheptadine (PERIACTIN) 4 MG tablet 4 mg  4 mg Oral BID Darleene Cleaver, Shashank Kwasnik, MD   4 mg at 12/14/19 1416  . gabapentin (NEURONTIN) capsule 300 mg  300 mg Oral BID Darleene Cleaver, Mell Mellott, MD   300 mg at 12/14/19 1210   Current Outpatient Medications  Medication Sig Dispense Refill  . ALPRAZolam (XANAX) 1 MG tablet Take 1 mg by mouth 3 (three) times daily as needed for anxiety.     Marland Kitchen QUEtiapine (SEROQUEL) 50 MG tablet Take 50 mg by mouth daily.    . sertraline (ZOLOFT) 100 MG tablet Take 100 mg by mouth at bedtime.      Musculoskeletal: Strength & Muscle Tone: within normal limits Gait & Station: normal Patient leans: N/A  Psychiatric Specialty Exam: Physical Exam  Review of Systems  Blood pressure 133/77, pulse 66, temperature 98.1 F (36.7 C), temperature source Oral, resp. rate 15, SpO2 99 %.There is no height or weight on file to calculate BMI.  General Appearance: Casual and Guarded  Eye Contact:  Good  Speech:  Clear and Coherent  Volume:  Normal  Mood:  Anxious, Depressed and Irritable  Affect:  Congruent and Depressed  Thought Process:  Goal Directed and Descriptions of Associations: Tangential  Orientation:  Full (Time, Place, and Person)  Thought Content:  Illogical and as evidenced by intentional overdose  Suicidal Thoughts:  Yes.  with intent/plan  Homicidal Thoughts:  No  Memory:  Immediate;   Good Recent;   Good Remote;   Good  Judgement:  Impaired  Insight:  Lacking  Psychomotor Activity:   Normal  Concentration:  Concentration: Fair and Attention Span: Fair  Recall:  [employer] of Knowledge:  Fair  Language:  Good  Akathisia:  NA  Handed:  Right  AIMS (if indicated):     Assets:  Communication Skills Housing  ADL's:  Intact  Cognition:  WNL  Sleep:   <6  hours     Treatment Plan Summary: Daily contact with patient to assess and evaluate symptoms and progress in treatment, Medication management and Plan as outlined below  Disposition: Recommend psychiatric Inpatient admission when medically cleared. 44 year old female who presents to the emergency department via EMS after an intentional overdose on zoloft and seroquel.  She has a pmhx for  MDD, severe and recurrent, borderline personality, and polysubstance use disorder.  She has been seen by our facility with similar presentation for three times over the past 2 years with similar explanations.  The patient denies suicidal intent but she took a significant amount of pills, ~30 in addition to this, on arrival her UDS was positive for THC, Cocaine and benzodiazepines. She has limited protective factors(currently arguing with family members), she is diagnosed with a chronic illness (HIV) and is very impulsive as evidenced by substance use.  Based on above, recommend inpatient hospitalization for mood stabilization.  We discussed based on the lethality of her actions, an IVC could be initiated at which she would be hospitalized involuntarily.  She is very hesitant but agrees to inpatient admission.  This service was provided via telemedicine using a 2-way, interactive audio and video technology.  Names of all persons participating in this telemedicine service and their role in this encounter. Name: Gean Quint Role: patient  Name: Merlyn Lot Role: PMHNP  Name: Dr. Corena Pilgrim Role: Psychiatrist   Spoke with Dr. Tamera Punt; informed of above recommendation and disposition  Mallie Darting, NP 12/14/2019 7:17  PM

## 2019-12-14 NOTE — Patient Outreach (Signed)
CPSS met with Dr Tamera Punt which CPSS dicussed the conflict of interest from [place] knowing Pt's family. Dr Tamera Punt is OK with CPSS issuing paper information for Pt use.

## 2019-12-14 NOTE — Consult Note (Signed)
NP T.Nataliyah Packham initiated  IVC for patient due to overdose and suicidal ideation. Patient was accepts to [facility] 300 unit. On arrival to behavioral health patient left the hospital. Chart reviewed patient was recommended for inpatient admission by  Attending psychiatrist Akyntayo and NP. Molly Maduro.

## 2019-12-14 NOTE — Progress Notes (Signed)
Per Ethelda Chick, pt has been accepted to [facility]. Bed number is 300-2. Accepting provider is  Merlyn Lot, NP. Attending provider will be Dr. Mallie Darting. Number for report is [phone].  The pt may arrive by [place]. Pt may arrive at 2:30pm.  CSW spoke w/ Chrys Racer, RN to provide disposition information.   Darletta Moll MSW, [place] Worker Disposition  [facility] Ph: [phone] Fax: [phone]  12/14/2019 1:53 PM

## 2019-12-14 NOTE — ED Notes (Signed)
Meal tray provided at bedside

## 2019-12-14 NOTE — ED Notes (Signed)
Patient belongings put in bag and patient dressed out in burgundy scrubs. Dylan, NT walking out patient to meet safe transport.

## 2019-12-14 NOTE — ED Notes (Signed)
poison control contacted. No new recommendations. No further observation required

## 2019-12-14 NOTE — ED Notes (Addendum)
Patient has been approved for transport to [facility].

## 2019-12-14 NOTE — ED Notes (Signed)
Patient ambulated to bathroom independently at this time.

## 2021-02-17 ENCOUNTER — Encounter: Payer: Self-pay | Admitting: *Deleted

## 2021-03-08 ENCOUNTER — Ambulatory Visit: Payer: Medicaid Other | Admitting: Physician Assistant

## 2021-03-09 ENCOUNTER — Encounter: Payer: Self-pay | Admitting: *Deleted

## 2021-03-10 ENCOUNTER — Other Ambulatory Visit (HOSPITAL_COMMUNITY)
Admission: RE | Admit: 2021-03-10 | Discharge: 2021-03-10 | Disposition: A | Discharge status: Home / Self Care | Payer: Medicaid Other | Source: Ambulatory Visit | Attending: Obstetrics and Gynecology | Admitting: Obstetrics and Gynecology

## 2021-03-10 ENCOUNTER — Ambulatory Visit (INDEPENDENT_AMBULATORY_CARE_PROVIDER_SITE_OTHER): Payer: Medicaid Other | Admitting: Obstetrics and Gynecology

## 2021-03-10 ENCOUNTER — Other Ambulatory Visit: Payer: Self-pay

## 2021-03-10 ENCOUNTER — Telehealth: Payer: Self-pay

## 2021-03-10 ENCOUNTER — Encounter: Payer: Self-pay | Admitting: Obstetrics and Gynecology

## 2021-03-10 VITALS — BP 167/105 | HR 84 | Wt 268.7 lb

## 2021-03-10 DIAGNOSIS — Z01419 Encounter for gynecological examination (general) (routine) without abnormal findings: Secondary | ICD-10-CM | POA: Diagnosis not present

## 2021-03-10 DIAGNOSIS — Z1231 Encounter for screening mammogram for malignant neoplasm of breast: Secondary | ICD-10-CM

## 2021-03-10 DIAGNOSIS — B3731 Acute candidiasis of vulva and vagina: Secondary | ICD-10-CM

## 2021-03-10 DIAGNOSIS — B372 Candidiasis of skin and nail: Secondary | ICD-10-CM

## 2021-03-10 DIAGNOSIS — Z01411 Encounter for gynecological examination (general) (routine) with abnormal findings: Secondary | ICD-10-CM

## 2021-03-10 DIAGNOSIS — B373 Candidiasis of vulva and vagina: Secondary | ICD-10-CM

## 2021-03-10 DIAGNOSIS — N939 Abnormal uterine and vaginal bleeding, unspecified: Secondary | ICD-10-CM | POA: Insufficient documentation

## 2021-03-10 MED ORDER — MICONAZOLE NITRATE 2 % VA CREA: 1.0000 | TOPICAL_CREAM | Freq: Every day | VAGINAL | 2 refills | Status: AC

## 2021-03-10 MED ORDER — NYSTATIN 100000 UNIT/GM EX POWD: 1.0000 "application " | Freq: Two times a day (BID) | CUTANEOUS | 0 refills | Status: AC

## 2021-03-10 NOTE — Progress Notes (Signed)
U/S scheduled for 03/16/21 @ 2:30p. Mammogram scheduled for 05/05/21 @ 8:20, arrive 8am.

## 2021-03-10 NOTE — Telephone Encounter (Signed)
Called Pt to give appt dates for Mammogram 05/05/21 @ 8:20a, arrive at [place]. U/S appt at [facility] Radiology on 03/16/21 @ 2:30p. No answer, left VM.
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
## 2021-03-11 ENCOUNTER — Other Ambulatory Visit: Payer: Self-pay | Admitting: Obstetrics and Gynecology

## 2021-03-11 LAB — CERVICOVAGINAL ANCILLARY ONLY
Bacterial Vaginitis (gardnerella): POSITIVE — AB
Candida Glabrata: NEGATIVE
Candida Vaginitis: NEGATIVE
Chlamydia: NEGATIVE
Comment: NEGATIVE
Comment: NEGATIVE
Comment: NEGATIVE
Comment: NEGATIVE
Comment: NEGATIVE
Comment: NORMAL
Neisseria Gonorrhea: NEGATIVE
Trichomonas: NEGATIVE

## 2021-03-11 LAB — CYTOLOGY - PAP
Adequacy: ABSENT
Comment: NEGATIVE
Diagnosis: NEGATIVE
High risk HPV: NEGATIVE

## 2021-03-11 MED ORDER — FLUCONAZOLE 150 MG PO TABS: 150.0000 mg | ORAL_TABLET | ORAL | 0 refills | Status: DC

## 2021-03-11 MED ORDER — METRONIDAZOLE 500 MG PO TABS: 500.0000 mg | ORAL_TABLET | Freq: Two times a day (BID) | ORAL | 0 refills | Status: AC

## 2021-03-11 NOTE — Addendum Note (Signed)
Addended by: Aletha Halim on: 03/11/2021 04:42 PM   Modules accepted: Orders

## 2021-03-11 NOTE — Progress Notes (Signed)
Pt called nurse line asking about po diflucan. Ithought she only wanted topical. Diflucan sent in and flagyl sent in

## 2021-03-14 ENCOUNTER — Ambulatory Visit: Payer: Medicaid Other | Admitting: Diagnostic Neuroimaging

## 2021-03-14 ENCOUNTER — Encounter: Payer: Self-pay | Admitting: Diagnostic Neuroimaging

## 2021-03-14 ENCOUNTER — Other Ambulatory Visit: Payer: Self-pay

## 2021-03-14 ENCOUNTER — Telehealth: Payer: Self-pay

## 2021-03-14 VITALS — BP 148/82 | HR 87 | Ht 72.25 in | Wt 274.0 lb

## 2021-03-14 DIAGNOSIS — M26629 Arthralgia of temporomandibular joint, unspecified side: Secondary | ICD-10-CM | POA: Diagnosis not present

## 2021-03-14 DIAGNOSIS — M25512 Pain in left shoulder: Secondary | ICD-10-CM | POA: Diagnosis not present

## 2021-03-14 DIAGNOSIS — M26622 Arthralgia of left temporomandibular joint: Secondary | ICD-10-CM

## 2021-03-14 DIAGNOSIS — G8929 Other chronic pain: Secondary | ICD-10-CM

## 2021-03-14 NOTE — Telephone Encounter (Signed)
Referral to sports medicine has been sent to [place]. [phone]
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
## 2021-03-16 ENCOUNTER — Ambulatory Visit (HOSPITAL_COMMUNITY): Payer: Medicaid Other

## 2021-03-17 ENCOUNTER — Other Ambulatory Visit: Payer: Self-pay

## 2021-03-17 ENCOUNTER — Ambulatory Visit (HOSPITAL_COMMUNITY)
Admission: RE | Admit: 2021-03-17 | Discharge: 2021-03-17 | Disposition: A | Discharge status: Home / Self Care | Payer: Medicaid Other | Source: Ambulatory Visit | Attending: Obstetrics and Gynecology | Admitting: Obstetrics and Gynecology

## 2021-03-17 DIAGNOSIS — N939 Abnormal uterine and vaginal bleeding, unspecified: Secondary | ICD-10-CM | POA: Insufficient documentation

## 2021-03-18 ENCOUNTER — Encounter: Payer: Self-pay | Admitting: Obstetrics and Gynecology

## 2021-03-18 DIAGNOSIS — N83202 Unspecified ovarian cyst, left side: Secondary | ICD-10-CM | POA: Insufficient documentation

## 2021-03-28 ENCOUNTER — Other Ambulatory Visit: Payer: Self-pay

## 2021-03-28 ENCOUNTER — Ambulatory Visit (INDEPENDENT_AMBULATORY_CARE_PROVIDER_SITE_OTHER): Payer: Medicaid Other | Admitting: Family Medicine

## 2021-03-28 VITALS — BP 149/95 | Ht 72.0 in | Wt 260.0 lb

## 2021-03-28 DIAGNOSIS — S46819A Strain of other muscles, fascia and tendons at shoulder and upper arm level, unspecified arm, initial encounter: Secondary | ICD-10-CM | POA: Diagnosis not present

## 2021-03-28 DIAGNOSIS — S161XXA Strain of muscle, fascia and tendon at neck level, initial encounter: Secondary | ICD-10-CM

## 2021-03-28 NOTE — Patient Instructions (Signed)
You have strains of the muscles of your neck and upper back. Start physical therapy and do home exercises/stretches on days you don't go to therapy. Aleve 1-2 tabs twice a day with food if needed for pain and inflammation. Heat 15 minutes at a time 3-4 times a day. Follow up with me in 6 weeks. If not improving we will go ahead with an MRI as we discussed.

## 2021-03-29 ENCOUNTER — Encounter: Payer: Self-pay | Admitting: Family Medicine
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
## 2021-04-11 ENCOUNTER — Other Ambulatory Visit: Payer: Medicaid Other

## 2021-04-11 ENCOUNTER — Ambulatory Visit (INDEPENDENT_AMBULATORY_CARE_PROVIDER_SITE_OTHER): Payer: Medicaid Other | Admitting: Physician Assistant

## 2021-04-11 ENCOUNTER — Encounter: Payer: Self-pay | Admitting: Physician Assistant

## 2021-04-11 VITALS — BP 140/70 | HR 70 | Ht 72.0 in | Wt 267.0 lb

## 2021-04-11 DIAGNOSIS — B829 Intestinal parasitism, unspecified: Secondary | ICD-10-CM | POA: Diagnosis not present

## 2021-04-11 DIAGNOSIS — D509 Iron deficiency anemia, unspecified: Secondary | ICD-10-CM

## 2021-04-11 DIAGNOSIS — K5900 Constipation, unspecified: Secondary | ICD-10-CM

## 2021-04-11 DIAGNOSIS — R194 Change in bowel habit: Secondary | ICD-10-CM | POA: Diagnosis not present

## 2021-04-11 MED ORDER — NA SULFATE-K SULFATE-MG SULF 17.5-3.13-1.6 GM/177ML PO SOLN: 1.0000 | Freq: Once | ORAL | 0 refills | Status: AC

## 2021-04-11 NOTE — Progress Notes (Signed)
Attending Physician's Attestation   I have reviewed the chart.   I agree with the Advanced Practitioner's note, impression, and recommendations with any updates as below.    Tyshell Ramberg Mansouraty, MD [facility] Office # [phone]  
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
## 2021-04-11 NOTE — Patient Instructions (Addendum)
Start Miralax 1 capful daily in 8 ounces of liquid.  Your provider has requested that you go to the basement level for lab work before leaving today. Press "B" on the elevator. The lab is located at the first door on the left as you exit the elevator.  If you are age 46 or older, your body mass index should be between 23-30. Your Body mass index is 36.21 kg/m. If this is out of the aforementioned range listed, please consider follow up with your Primary Care Provider.  If you are age 68 or younger, your body mass index should be between 19-25. Your Body mass index is 36.21 kg/m. If this is out of the aformentioned range listed, please consider follow up with your Primary Care Provider.   __________________________________________________________  The [facility] providers would like to encourage you to use [facility] to communicate with providers for non-urgent requests or questions.  Due to long hold times on the telephone, sending your provider a message by [facility] may be a faster and more efficient way to get a response.  Please allow 48 business hours for a response.  Please remember that this is for non-urgent requests.

## 2021-04-13 ENCOUNTER — Other Ambulatory Visit: Payer: Medicaid Other

## 2021-04-13 DIAGNOSIS — B829 Intestinal parasitism, unspecified: Secondary | ICD-10-CM

## 2021-04-15 LAB — OVA AND PARASITE EXAMINATION
CONCENTRATE RESULT:: NONE SEEN
MICRO NUMBER:: 12114207
SPECIMEN QUALITY:: ADEQUATE
TRICHROME RESULT:: NONE SEEN

## 2021-04-18 ENCOUNTER — Telehealth: Payer: Self-pay | Admitting: Gastroenterology

## 2021-04-18 ENCOUNTER — Ambulatory Visit: Payer: Medicaid Other

## 2021-04-18 NOTE — Telephone Encounter (Signed)
Letter faxed to Department of Transportation.

## 2021-04-18 NOTE — Telephone Encounter (Signed)
Inbound call from patient. States she uses transportation and in order to have another person to ride, the transportation services would need a faxed letter stating the day of her procedure, place and time of arrival, and that she have to have someone with her during the procedure. States it is the rules of the services.   Department of Transportation Fax: [phone]

## 2021-04-19 NOTE — Telephone Encounter (Signed)
Letter faxed to transportation again.

## 2021-04-21 ENCOUNTER — Other Ambulatory Visit: Payer: Self-pay

## 2021-04-21 ENCOUNTER — Encounter: Payer: Self-pay | Admitting: Rehabilitative and Restorative Service Providers"

## 2021-04-21 ENCOUNTER — Ambulatory Visit: Payer: Medicaid Other | Attending: Physician Assistant | Admitting: Rehabilitative and Restorative Service Providers"

## 2021-04-21 DIAGNOSIS — M542 Cervicalgia: Secondary | ICD-10-CM | POA: Insufficient documentation

## 2021-04-21 DIAGNOSIS — R293 Abnormal posture: Secondary | ICD-10-CM | POA: Insufficient documentation

## 2021-04-21 DIAGNOSIS — M6281 Muscle weakness (generalized): Secondary | ICD-10-CM | POA: Diagnosis present

## 2021-04-21 NOTE — Patient Instructions (Signed)
Access Code: [email or web address] URL: [email or web address] Date: 04/21/2021 Prepared by: Myra Rude  Exercises Gentle Levator Scapulae Stretch - 2 x daily - 7 x weekly - 1 sets - 2 reps - 30 sec hold Standing Cervical Sidebending AROM - 2 x daily - 7 x weekly - 1 sets - 2 reps - 30 sec hold Corner Pec Major Stretch - 2 x daily - 7 x weekly - 1 sets - 2 reps - 30 sec hold
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
## 2021-04-22 ENCOUNTER — Other Ambulatory Visit: Payer: Self-pay

## 2021-04-22 ENCOUNTER — Telehealth: Payer: Self-pay

## 2021-04-22 DIAGNOSIS — B829 Intestinal parasitism, unspecified: Secondary | ICD-10-CM

## 2021-04-22 NOTE — Telephone Encounter (Signed)
-----   Message from Levin Erp, [place] sent at 04/22/2021  9:42 AM EDT ----- Regarding: O&P exams Can you please orders for 2 more ova and parasite exams.  The patient should take 2 different samples on different days to make sure we are not missing anything.  Please call the patient and let her know.  Thanks, JL L

## 2021-04-22 NOTE — Telephone Encounter (Signed)
Orders entered as requested. Pt notified via mychart as she initiated communication regarding the labs by [employer].

## 2021-04-26 ENCOUNTER — Ambulatory Visit (AMBULATORY_SURGERY_CENTER): Payer: Medicaid Other | Admitting: Gastroenterology

## 2021-04-26 ENCOUNTER — Other Ambulatory Visit: Payer: Self-pay

## 2021-04-26 ENCOUNTER — Encounter: Payer: Self-pay | Admitting: Gastroenterology

## 2021-04-26 VITALS — BP 155/93 | HR 74 | Temp 97.5°F | Resp 13 | Ht 72.0 in | Wt 267.0 lb

## 2021-04-26 DIAGNOSIS — R12 Heartburn: Secondary | ICD-10-CM

## 2021-04-26 DIAGNOSIS — D124 Benign neoplasm of descending colon: Secondary | ICD-10-CM

## 2021-04-26 DIAGNOSIS — K319 Disease of stomach and duodenum, unspecified: Secondary | ICD-10-CM

## 2021-04-26 DIAGNOSIS — K6289 Other specified diseases of anus and rectum: Secondary | ICD-10-CM | POA: Diagnosis not present

## 2021-04-26 DIAGNOSIS — K2970 Gastritis, unspecified, without bleeding: Secondary | ICD-10-CM | POA: Diagnosis not present

## 2021-04-26 DIAGNOSIS — K5900 Constipation, unspecified: Secondary | ICD-10-CM | POA: Diagnosis not present

## 2021-04-26 DIAGNOSIS — R194 Change in bowel habit: Secondary | ICD-10-CM

## 2021-04-26 DIAGNOSIS — D509 Iron deficiency anemia, unspecified: Secondary | ICD-10-CM

## 2021-04-26 MED ORDER — SODIUM CHLORIDE 0.9 % IV SOLN: 500.0000 mL | Freq: Once | INTRAVENOUS | Status: DC

## 2021-04-26 NOTE — Progress Notes (Signed)
Called to room to assist during endoscopic procedure.  Patient ID and intended procedure confirmed with present staff. Received instructions for my participation in the procedure from the performing physician.  

## 2021-04-26 NOTE — Patient Instructions (Signed)
YOU HAD AN ENDOSCOPIC PROCEDURE TODAY AT THE [facility]:   Refer to the procedure report that was given to you for any specific questions about what was found during the examination.  If the procedure report does not answer your questions, please call your gastroenterologist to clarify.  If you requested that your care partner not be given the details of your procedure findings, then the procedure report has been included in a sealed envelope for you to review at your convenience later.  YOU SHOULD EXPECT: Some feelings of bloating in the abdomen. Passage of more gas than usual.  Walking can help get rid of the air that was put into your GI tract during the procedure and reduce the bloating. If you had a lower endoscopy (such as a colonoscopy or flexible sigmoidoscopy) you may notice spotting of blood in your stool or on the toilet paper. If you underwent a bowel prep for your procedure, you may not have a normal bowel movement for a few days.  Please Note:  You might notice some irritation and congestion in your nose or some drainage.  This is from the oxygen used during your procedure.  There is no need for concern and it should clear up in a day or so.  SYMPTOMS TO REPORT IMMEDIATELY:   Following lower endoscopy (colonoscopy or flexible sigmoidoscopy):  Excessive amounts of blood in the stool  Significant tenderness or worsening of abdominal pains  Swelling of the abdomen that is new, acute  Fever of 100F or higher   Following upper endoscopy (EGD)  Vomiting of blood or coffee ground material  New chest pain or pain under the shoulder blades  Painful or persistently difficult swallowing  New shortness of breath  Fever of 100F or higher  Black, tarry-looking stools  For urgent or emergent issues, a gastroenterologist can be reached at any hour by calling [phone]. Do not use MyChart messaging for urgent concerns.    DIET:  We do recommend a small meal at first, but  then you may proceed to your regular diet.  Drink plenty of fluids but you should avoid alcoholic beverages for 24 hours.  ACTIVITY:  You should plan to take it easy for the rest of today and you should NOT DRIVE or use heavy machinery until tomorrow (because of the sedation medicines used during the test).    FOLLOW UP: Our staff will call the number listed on your records 48-72 hours following your procedure to check on you and address any questions or concerns that you may have regarding the information given to you following your procedure. If we do not reach you, we will leave a message.  We will attempt to reach you two times.  During this call, we will ask if you have developed any symptoms of COVID 19. If you develop any symptoms (ie: fever, flu-like symptoms, shortness of breath, cough etc.) before then, please call [phone].  If you test positive for Covid 19 in the 2 weeks post procedure, please call and report this information to us.    If any biopsies were taken you will be contacted by phone or by letter within the next 1-3 weeks.  Please call us at [phone] if you have not heard about the biopsies in 3 weeks.    SIGNATURES/CONFIDENTIALITY: You and/or your care partner have signed paperwork which will be entered into your electronic medical record.  These signatures attest to the fact that that the information above on   your After Visit Summary has been reviewed and is understood.  Full responsibility of the confidentiality of this discharge information lies with you and/or your care-partner. 

## 2021-04-26 NOTE — Progress Notes (Signed)
Vitals-CW ?

## 2021-04-26 NOTE — Progress Notes (Signed)
PT taken to PACU. Monitors in place. VSS. Report given to RN. 

## 2021-04-26 NOTE — Op Note (Signed)
[place] Patient Name: Ashley Stevens Procedure Date: 04/26/2021 9:10 AM MRN: [id number] Endoscopist: Justice Britain , MD Age: 45 Referring MD:  Date of Birth: August 21, 1976 Gender: Female Account #: [id number] Procedure:                Upper GI endoscopy Indications:              Generalized abdominal pain, Iron deficiency anemia,                            Heartburn Medicines:                Monitored Anesthesia Care Procedure:                Pre-Anesthesia Assessment:                           - Prior to the procedure, a History and Physical                            was performed, and patient medications and                            allergies were reviewed. The patient's tolerance of                            previous anesthesia was also reviewed. The risks                            and benefits of the procedure and the sedation                            options and risks were discussed with the patient.                            All questions were answered, and informed consent                            was obtained. Prior Anticoagulants: The patient has                            taken no previous anticoagulant or antiplatelet                            agents. ASA Grade Assessment: II - A patient with                            mild systemic disease. After reviewing the risks                            and benefits, the patient was deemed in                            satisfactory condition to undergo the procedure.  After obtaining informed consent, the endoscope was                            passed under direct vision. Throughout the                            procedure, the patient's blood pressure, pulse, and                            oxygen saturations were monitored continuously. The                            Endoscope was introduced through the mouth, and                            advanced to the second part of duodenum.  The upper                            GI endoscopy was accomplished without difficulty.                            The patient tolerated the procedure. Scope In: Scope Out: Findings:                 No gross lesions were noted in the entire                            esophagus. Biopsies were taken with a cold forceps                            for histology.                           The Z-line was regular and was found 43 cm from the                            incisors.                           Patchy mildly erythematous mucosa without bleeding                            was found in the gastric antrum.                           No other gross lesions were noted in the entire                            examined stomach. Biopsies were taken with a cold                            forceps for histology and Helicobacter pylori                            testing.  No gross lesions were noted in the duodenal bulb,                            in the first portion of the duodenum and in the                            second portion of the duodenum. Biopsies were taken                            with a cold forceps for histology. Complications:            No immediate complications. Estimated Blood Loss:     Estimated blood loss was minimal. Impression:               - No gross lesions in esophagus. Biopsied.                           - Z-line regular, 43 cm from the incisors.                           - Erythematous mucosa in the antrum. No other gross                            lesions in the stomach. Biopsied.                           - No gross lesions in the duodenal bulb, in the                            first portion of the duodenum and in the second                            portion of the duodenum. Biopsied. Recommendation:           - Proceed to scheduled colonoscopy.                           - Continue present medications.                           -  Await pathology results.                           - Consider initiation of PPI based on results of                            pathology.                           - The findings and recommendations were discussed                            with the patient.                           - The findings  and recommendations were discussed                            with the patient's family. Justice Britain, MD 04/26/2021 10:06:34 AM

## 2021-04-26 NOTE — Op Note (Signed)
[place] Patient Name: Ashley Stevens Procedure Date: 04/26/2021 9:09 AM MRN: [id number] Endoscopist: Justice Britain , MD Age: 45 Referring MD:  Date of Birth: August 28, 1976 Gender: Female Account #: [id number] Procedure:                Colonoscopy Indications:              Screening for colorectal malignant neoplasm, This                            is the patient's first colonoscopy, Incidental -                            Constipation, Incidental - Rectal pain Medicines:                Monitored Anesthesia Care Procedure:                Pre-Anesthesia Assessment:                           - Prior to the procedure, a History and Physical                            was performed, and patient medications and                            allergies were reviewed. The patient's tolerance of                            previous anesthesia was also reviewed. The risks                            and benefits of the procedure and the sedation                            options and risks were discussed with the patient.                            All questions were answered, and informed consent                            was obtained. Prior Anticoagulants: The patient has                            taken no previous anticoagulant or antiplatelet                            agents. ASA Grade Assessment: II - A patient with                            mild systemic disease. After reviewing the risks                            and benefits, the patient was deemed in  satisfactory condition to undergo the procedure.                           After obtaining informed consent, the colonoscope                            was passed under direct vision. Throughout the                            procedure, the patient's blood pressure, pulse, and                            oxygen saturations were monitored continuously. The                            Olympus CF-HQ190L  (Serial# [id number]) Colonoscope was                            introduced through the anus and advanced to the 5                            cm into the ileum. The colonoscopy was performed                            without difficulty. The patient tolerated the                            procedure. The quality of the bowel preparation was                            adequate. The terminal ileum, ileocecal valve,                            appendiceal orifice, and rectum were photographed. Scope In: 9:40:14 AM Scope Out: 9:57:18 AM Scope Withdrawal Time: 0 hours 11 minutes 22 seconds  Total Procedure Duration: 0 hours 17 minutes 4 seconds  Findings:                 The digital rectal exam findings include                            hemorrhoids. Pertinent negatives include no                            palpable rectal lesions. I had patient before                            sedation perform a valsalva and I feel what is                            suggestive of an anterior prolapse into the rectum                            concerning for possible cystocele.  The terminal ileum and ileocecal valve appeared                            normal.                           Two sessile polyps were found in the descending                            colon and transverse colon. The polyps were 2 to 4                            mm in size. These polyps were removed with a cold                            snare. Resection and retrieval were complete.                           Normal mucosa was found in the entire colon                            otherwise.                           Non-bleeding non-thrombosed internal hemorrhoids                            were found during retroflexion, during perianal                            exam and during digital exam. The hemorrhoids were                            Grade II (internal hemorrhoids that prolapse but                             reduce spontaneously). Complications:            No immediate complications. Estimated Blood Loss:     Estimated blood loss was minimal. Impression:               - Hemorrhoids found on digital rectal exam.                           - The examined portion of the ileum was normal.                           - Two 2 to 4 mm polyps in the descending colon and                            in the transverse colon, removed with a cold snare.                            Resected and retrieved.                           -  Normal mucosa in the entire examined colon                            otherwise.                           - Non-bleeding non-thrombosed internal hemorrhoids. Recommendation:           - The patient will be observed post-procedure,                            until all discharge criteria are met.                           - Discharge patient to home.                           - Patient has a contact number available for                            emergencies. The signs and symptoms of potential                            delayed complications were discussed with the                            patient. Return to normal activities tomorrow.                            Written discharge instructions were provided to the                            patient.                           - High fiber diet.                           - Use FiberCon 1-2 tablets PO daily.                           - Continue present medications.                           - Await pathology results.                           - Repeat colonoscopy in 02/05/09 years for                            surveillance based on pathology results.                           - Refer to an urogynecologist could be helpful to                            further clarify potential cystocele or other  pelvic                            floor prolapse.                           - The findings and recommendations were discussed                             with the patient. Justice Britain, MD 04/26/2021 10:14:58 AM

## 2021-04-27 ENCOUNTER — Ambulatory Visit: Payer: Medicaid Other | Admitting: Rehabilitative and Restorative Service Providers"

## 2021-04-28 ENCOUNTER — Telehealth: Payer: Self-pay

## 2021-04-28 NOTE — Telephone Encounter (Signed)
  Follow up Call-  Call back number 04/26/2021  Post procedure Call Back phone  # [phone]  Permission to leave phone message Yes  Some recent data might be hidden     Patient questions:  Do you have a fever, pain , or abdominal swelling? No. Pain Score  0 *  Have you tolerated food without any problems? Yes.    Have you been able to return to your normal activities? Yes.    Do you have any questions about your discharge instructions: Diet   No. Medications  No. Follow up visit  Yes.    Do you have questions or concerns about your Care? no  Actions: * If pain score is 4 or above: No action needed, pain <4.    Have you developed a fever since your procedure? no  2.   Have you had an respiratory symptoms (SOB or cough) since your procedure? no  3.   Have you tested positive for COVID 19 since your procedure no  4.   Have you had any family members/close contacts diagnosed with the COVID 19 since your procedure?  no   If yes to any of these questions please route to Joylene John, RN and Joella Prince, RN

## 2021-04-28 NOTE — Telephone Encounter (Signed)
Left message on follow up call. 

## 2021-04-29 ENCOUNTER — Ambulatory Visit: Payer: Medicaid Other | Admitting: Physical Therapy

## 2021-04-29 ENCOUNTER — Telehealth: Payer: Self-pay | Admitting: Physical Therapy

## 2021-04-29 ENCOUNTER — Ambulatory Visit: Payer: Medicaid Other | Admitting: Obstetrics and Gynecology

## 2021-04-29 NOTE — Telephone Encounter (Signed)
Attempted to contact patient regarding no-show to appointment today. Left voicemail with next appointment date /time and a reminder of clinic attendance policy.

## 2021-05-02 ENCOUNTER — Other Ambulatory Visit (HOSPITAL_COMMUNITY): Payer: Self-pay

## 2021-05-02 ENCOUNTER — Other Ambulatory Visit: Payer: Self-pay

## 2021-05-02 ENCOUNTER — Emergency Department (HOSPITAL_COMMUNITY)
Admission: EM | Admit: 2021-05-02 | Discharge: 2021-05-02 | Disposition: A | Discharge status: Home / Self Care | Payer: Medicaid Other | Attending: Emergency Medicine | Admitting: Emergency Medicine

## 2021-05-02 ENCOUNTER — Encounter: Payer: Self-pay | Admitting: Gastroenterology

## 2021-05-02 ENCOUNTER — Encounter (HOSPITAL_COMMUNITY): Payer: Self-pay

## 2021-05-02 ENCOUNTER — Emergency Department (HOSPITAL_COMMUNITY): Payer: Medicaid Other

## 2021-05-02 DIAGNOSIS — M5126 Other intervertebral disc displacement, lumbar region: Secondary | ICD-10-CM | POA: Insufficient documentation

## 2021-05-02 DIAGNOSIS — Z79899 Other long term (current) drug therapy: Secondary | ICD-10-CM | POA: Diagnosis not present

## 2021-05-02 DIAGNOSIS — M5116 Intervertebral disc disorders with radiculopathy, lumbar region: Secondary | ICD-10-CM

## 2021-05-02 DIAGNOSIS — F17210 Nicotine dependence, cigarettes, uncomplicated: Secondary | ICD-10-CM | POA: Insufficient documentation

## 2021-05-02 DIAGNOSIS — I10 Essential (primary) hypertension: Secondary | ICD-10-CM | POA: Diagnosis not present

## 2021-05-02 DIAGNOSIS — M545 Low back pain, unspecified: Secondary | ICD-10-CM | POA: Diagnosis present

## 2021-05-02 MED ORDER — NAPROXEN 375 MG PO TABS
375.0000 mg | ORAL_TABLET | Freq: Two times a day (BID) | ORAL | 0 refills | Status: AC
  Filled 2021-05-02: qty 20, 10d supply, fill #0

## 2021-05-02 MED ORDER — PREDNISONE 20 MG PO TABS
ORAL_TABLET | ORAL | 0 refills | Status: AC
  Filled 2021-05-02: qty 27, 15d supply, fill #0

## 2021-05-02 MED ORDER — NAPROXEN 250 MG PO TABS
500.0000 mg | ORAL_TABLET | Freq: Once | ORAL | Status: AC
  Administered 2021-05-02: 500 mg via ORAL
  Filled 2021-05-02: qty 2

## 2021-05-02 NOTE — ED Triage Notes (Signed)
C/o Left lower back pain radiating to her Left leg. Numbness to her Left leg. Pt had a colonoscopy Tuesday. Feels her symptoms have intensified. Pt concerned for losing her leg d/t decrease circulation. Orthopedic wants to schedule an MRI in 6 weeks if symptoms do not improve also is waiting for insurance approval. Pt there today requesting an MRI so she doesn't lose her Left leg d/t no circulation.
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
## 2021-05-02 NOTE — Discharge Instructions (Addendum)
If you develop worsening, recurrent, or continued back pain, numbness or weakness in the legs, incontinence of your bowels or bladders, numbness of your buttocks, fever, abdominal pain, or any other new/concerning symptoms then return to the ER for evaluation.  

## 2021-05-03 ENCOUNTER — Encounter: Payer: Self-pay | Admitting: Physical Therapy

## 2021-05-03 ENCOUNTER — Ambulatory Visit: Payer: Medicaid Other | Attending: Physician Assistant | Admitting: Physical Therapy

## 2021-05-03 ENCOUNTER — Other Ambulatory Visit: Payer: Self-pay

## 2021-05-03 DIAGNOSIS — M6281 Muscle weakness (generalized): Secondary | ICD-10-CM | POA: Diagnosis present

## 2021-05-03 DIAGNOSIS — M542 Cervicalgia: Secondary | ICD-10-CM | POA: Diagnosis not present

## 2021-05-03 DIAGNOSIS — R293 Abnormal posture: Secondary | ICD-10-CM | POA: Insufficient documentation

## 2021-05-03 DIAGNOSIS — N814 Uterovaginal prolapse, unspecified: Secondary | ICD-10-CM

## 2021-05-03 MED ORDER — ESOMEPRAZOLE MAGNESIUM 40 MG PO CPDR: 40.0000 mg | DELAYED_RELEASE_CAPSULE | Freq: Every day | ORAL | 0 refills | Status: AC
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
## 2021-05-03 NOTE — Patient Instructions (Signed)
Access Code: [email or web address] URL: [email or web address] Date: 05/03/2021 Prepared by: Hessie Diener  Exercises Gentle Levator Scapulae Stretch - 2 x daily - 7 x weekly - 1 sets - 2 reps - 30 sec hold Standing Cervical Sidebending AROM - 2 x daily - 7 x weekly - 1 sets - 2 reps - 30 sec hold Corner Pec Major Stretch - 2 x daily - 7 x weekly - 1 sets - 2 reps - 30 sec hold Supine Cervical Retraction with Towel - 2 x daily - 7 x weekly - 1 sets - 10 reps Seated Cervical Retraction - 2 x daily - 7 x weekly - 1 sets - 10 reps Seated Scapular Retraction - 1 x daily - 7 x weekly - 1 sets - 10 reps - 5 hold

## 2021-05-05 ENCOUNTER — Ambulatory Visit: Payer: Medicaid Other

## 2021-05-09 ENCOUNTER — Ambulatory Visit: Payer: Medicaid Other | Admitting: Family Medicine

## 2021-05-11 ENCOUNTER — Encounter: Payer: Self-pay | Admitting: Physical Therapy

## 2021-05-11 ENCOUNTER — Ambulatory Visit: Payer: Medicaid Other | Admitting: Physical Therapy

## 2021-05-11 ENCOUNTER — Other Ambulatory Visit: Payer: Self-pay

## 2021-05-11 DIAGNOSIS — M542 Cervicalgia: Secondary | ICD-10-CM

## 2021-05-11 DIAGNOSIS — M6281 Muscle weakness (generalized): Secondary | ICD-10-CM

## 2021-05-11 DIAGNOSIS — R293 Abnormal posture: Secondary | ICD-10-CM
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
## 2021-05-13 ENCOUNTER — Ambulatory Visit: Payer: Medicaid Other | Admitting: Physical Therapy

## 2021-05-17 ENCOUNTER — Ambulatory Visit: Payer: Medicaid Other | Admitting: Physical Therapy

## 2021-05-18 ENCOUNTER — Ambulatory Visit: Payer: Medicaid Other | Admitting: Family Medicine

## 2021-05-19 ENCOUNTER — Ambulatory Visit: Payer: Medicaid Other | Admitting: Physical Therapy

## 2021-05-20 ENCOUNTER — Telehealth: Payer: Self-pay | Admitting: Physical Therapy

## 2021-05-20 ENCOUNTER — Encounter: Payer: Medicaid Other | Admitting: Physical Therapy

## 2021-05-20 ENCOUNTER — Telehealth: Payer: Self-pay | Admitting: Gastroenterology

## 2021-05-20 NOTE — Telephone Encounter (Signed)
I spoke with [facility] and the pt has been scheduled for 12/27 at 9 am with Dr Landry Mellow at [place] [address].   The pt has been advised.  She advised her address was incorrect so I added the new address and called [facility] back and corrected the address with that office.  Records faxed to [phone].      [facility]- urogynecology   05/03/2021 11:30 AM Jannell Franta Merian Capron, RN [facility] AMB CHART REVIEW DEMOGRAPHICS      Cover Page Message : This pt needs an appt to discuss pelvic floor prolapse/cystocele       [facility]- Urogynecology   05/03/2021 11:30 AM Timothy Lasso, RN Procedure visit on 04/26/2021 with Mansouraty, Telford Nab., MD  Office Visit on 04/11/2021 with Levin Erp, PA      Cover Page Message : This pt needs an appt to discuss pelvic floor prolapse/cystocele       [facility] - Urogynecology   05/03/2021 11:29 AM Timothy Lasso, RN SURGICAL PATHOLOGY ([id number])      Cover Page Message : This pt needs an appt to discuss pelvic floor prolapse/cystocele

## 2021-05-20 NOTE — Telephone Encounter (Signed)
Contacted patient regarding cancellations and no-show to PT appointments. Left message with reminder of next appointment date and time. Also left reminder of attendance policy and the need to schedule one appointment at a time going forward.

## 2021-05-25 ENCOUNTER — Ambulatory Visit: Payer: Medicaid Other | Admitting: Physical Therapy

## 2021-05-25 ENCOUNTER — Ambulatory Visit: Payer: Medicaid Other | Admitting: Family Medicine

## 2021-05-27 ENCOUNTER — Ambulatory Visit: Payer: Medicaid Other | Admitting: Physical Therapy

## 2021-05-31 ENCOUNTER — Other Ambulatory Visit: Payer: Self-pay | Admitting: Oral Surgery

## 2021-05-31 DIAGNOSIS — M26623 Arthralgia of bilateral temporomandibular joint: Secondary | ICD-10-CM
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
## 2021-06-08 ENCOUNTER — Other Ambulatory Visit: Payer: Self-pay | Admitting: Oral Surgery

## 2021-06-08 DIAGNOSIS — M26623 Arthralgia of bilateral temporomandibular joint: Secondary | ICD-10-CM

## 2021-06-24 ENCOUNTER — Inpatient Hospital Stay: Admission: RE | Admit: 2021-06-24 | Payer: Medicaid Other | Source: Ambulatory Visit

## 2021-07-25 ENCOUNTER — Ambulatory Visit
Admission: RE | Admit: 2021-07-25 | Discharge: 2021-07-25 | Disposition: A | Discharge status: Home / Self Care | Payer: Medicaid Other | Source: Ambulatory Visit | Attending: Oral Surgery | Admitting: Oral Surgery

## 2021-07-25 DIAGNOSIS — M26623 Arthralgia of bilateral temporomandibular joint: Secondary | ICD-10-CM

## 2023-12-13 ENCOUNTER — Other Ambulatory Visit (HOSPITAL_BASED_OUTPATIENT_CLINIC_OR_DEPARTMENT_OTHER): Payer: Self-pay
